# Patient Record
Sex: Female | Born: 1950 | Race: Black or African American | Hispanic: No | Marital: Married
Health system: Midwestern US, Community
[De-identification: ages and names within clinical notes are randomized; demographics above are authoritative.]

## PROBLEM LIST (undated history)

## (undated) DIAGNOSIS — K2950 Unspecified chronic gastritis without bleeding: Secondary | ICD-10-CM

## (undated) DIAGNOSIS — K862 Cyst of pancreas: Secondary | ICD-10-CM

## (undated) DIAGNOSIS — N183 Chronic kidney disease, stage 3 unspecified: Secondary | ICD-10-CM

## (undated) DIAGNOSIS — Z13820 Encounter for screening for osteoporosis: Secondary | ICD-10-CM

## (undated) DIAGNOSIS — Z1211 Encounter for screening for malignant neoplasm of colon: Secondary | ICD-10-CM

## (undated) DIAGNOSIS — K8010 Calculus of gallbladder with chronic cholecystitis without obstruction: Secondary | ICD-10-CM

## (undated) DIAGNOSIS — C50919 Malignant neoplasm of unspecified site of unspecified female breast: Secondary | ICD-10-CM

## (undated) DIAGNOSIS — I2510 Atherosclerotic heart disease of native coronary artery without angina pectoris: Secondary | ICD-10-CM

## (undated) DIAGNOSIS — E079 Disorder of thyroid, unspecified: Secondary | ICD-10-CM

## (undated) DIAGNOSIS — Z9221 Personal history of antineoplastic chemotherapy: Secondary | ICD-10-CM

## (undated) DIAGNOSIS — I38 Endocarditis, valve unspecified: Secondary | ICD-10-CM

## (undated) DIAGNOSIS — Z923 Personal history of irradiation: Secondary | ICD-10-CM

## (undated) DIAGNOSIS — E669 Obesity, unspecified: Secondary | ICD-10-CM

## (undated) DIAGNOSIS — I214 Non-ST elevation (NSTEMI) myocardial infarction: Secondary | ICD-10-CM

## (undated) DIAGNOSIS — I10 Essential (primary) hypertension: Secondary | ICD-10-CM

## (undated) DIAGNOSIS — E785 Hyperlipidemia, unspecified: Secondary | ICD-10-CM

## (undated) DIAGNOSIS — Z9861 Coronary angioplasty status: Secondary | ICD-10-CM

## (undated) HISTORY — DX: Obesity, unspecified: E66.9

## (undated) HISTORY — DX: Hyperlipidemia, unspecified: E78.5

## (undated) HISTORY — DX: Endocarditis, valve unspecified: I38

## (undated) HISTORY — PX: LYMPH NODE DISSECTION: SHX5087

## (undated) MED ORDER — IBUPROFEN 600 MG TAB
600 mg | ORAL_TABLET | Freq: Three times a day (TID) | ORAL | Status: DC
Start: ? — End: 2012-04-19

## (undated) MED ORDER — DOCUSATE SODIUM 100 MG CAP
100 mg | ORAL_CAPSULE | Freq: Two times a day (BID) | ORAL | Status: DC
Start: ? — End: 2012-04-19

## (undated) MED ORDER — HYDROCODONE-ACETAMINOPHEN 5 MG-325 MG TAB
5-325 mg | ORAL_TABLET | ORAL | Status: DC | PRN
Start: ? — End: 2012-04-19

---

## 2001-11-19 ENCOUNTER — Encounter: Payer: Self-pay | Admitting: Endocrinology

## 2001-11-19 ENCOUNTER — Encounter: Admission: RE | Admit: 2001-11-19 | Discharge: 2001-11-19 | Payer: Self-pay | Admitting: Endocrinology

## 2001-12-22 ENCOUNTER — Other Ambulatory Visit: Admission: RE | Admit: 2001-12-22 | Discharge: 2001-12-22 | Payer: Self-pay | Admitting: Obstetrics and Gynecology

## 2003-04-11 ENCOUNTER — Other Ambulatory Visit: Admission: RE | Admit: 2003-04-11 | Discharge: 2003-04-11 | Payer: Self-pay | Admitting: Obstetrics and Gynecology

## 2003-09-14 ENCOUNTER — Emergency Department (HOSPITAL_COMMUNITY): Admission: EM | Admit: 2003-09-14 | Discharge: 2003-09-14 | Payer: Self-pay

## 2003-09-17 ENCOUNTER — Inpatient Hospital Stay (HOSPITAL_COMMUNITY): Admission: EM | Admit: 2003-09-17 | Discharge: 2003-09-26 | Payer: Self-pay | Admitting: *Deleted

## 2003-10-06 ENCOUNTER — Ambulatory Visit (HOSPITAL_COMMUNITY): Admission: RE | Admit: 2003-10-06 | Discharge: 2003-10-06 | Payer: Self-pay | Admitting: Urology

## 2004-10-14 ENCOUNTER — Other Ambulatory Visit: Admission: RE | Admit: 2004-10-14 | Discharge: 2004-10-14 | Payer: Self-pay | Admitting: Obstetrics and Gynecology

## 2008-08-10 ENCOUNTER — Ambulatory Visit: Payer: Self-pay | Admitting: Oncology

## 2008-08-17 ENCOUNTER — Encounter: Admission: RE | Admit: 2008-08-17 | Discharge: 2008-08-17 | Payer: Self-pay | Admitting: Neurology

## 2008-08-30 ENCOUNTER — Encounter: Admission: RE | Admit: 2008-08-30 | Discharge: 2008-11-28 | Payer: Self-pay | Admitting: Oncology

## 2008-10-30 ENCOUNTER — Encounter: Admission: RE | Admit: 2008-10-30 | Discharge: 2008-10-30 | Payer: Self-pay | Admitting: Orthopedic Surgery

## 2008-11-08 ENCOUNTER — Ambulatory Visit: Payer: Self-pay | Admitting: Oncology

## 2008-11-10 LAB — CBC WITH DIFFERENTIAL/PLATELET
BASO%: 0.3 % (ref 0.0–2.0)
EOS%: 2.4 % (ref 0.0–7.0)
Eosinophils Absolute: 0.1 10*3/uL (ref 0.0–0.5)
HGB: 12.2 g/dL (ref 11.6–15.9)
MONO#: 0.6 10*3/uL (ref 0.1–0.9)
MONO%: 13.6 % (ref 0.0–14.0)
NEUT#: 3 10*3/uL (ref 1.5–6.5)
NEUT%: 69.7 % (ref 38.4–76.8)
lymph#: 0.6 10*3/uL — ABNORMAL LOW (ref 0.9–3.3)

## 2008-11-10 LAB — BASIC METABOLIC PANEL
BUN: 12 mg/dL (ref 6–23)
Potassium: 3.8 mEq/L (ref 3.5–5.3)
Sodium: 140 mEq/L (ref 135–145)

## 2008-11-13 ENCOUNTER — Encounter: Admission: RE | Admit: 2008-11-13 | Discharge: 2008-11-13 | Payer: Self-pay | Admitting: Oncology

## 2009-01-24 ENCOUNTER — Ambulatory Visit: Payer: Self-pay | Admitting: Oncology

## 2009-01-29 ENCOUNTER — Ambulatory Visit (HOSPITAL_COMMUNITY): Admission: RE | Admit: 2009-01-29 | Discharge: 2009-01-29 | Payer: Self-pay | Admitting: Oncology

## 2009-06-14 ENCOUNTER — Ambulatory Visit: Payer: Self-pay | Admitting: Oncology

## 2009-07-01 ENCOUNTER — Emergency Department (HOSPITAL_COMMUNITY): Admission: EM | Admit: 2009-07-01 | Discharge: 2009-07-01 | Payer: Self-pay | Admitting: Emergency Medicine

## 2009-10-18 LAB — D-DIMER, QUANTITATIVE: D-Dimer, Quant: 0.6 mg/L FEU (ref 0.00–0.65)

## 2009-10-18 LAB — METABOLIC PANEL, COMPREHENSIVE
A-G Ratio: 0.8 — ABNORMAL LOW (ref 1.1–2.2)
ALT (SGPT): 29 U/L (ref 12–78)
AST (SGOT): 12 U/L — ABNORMAL LOW (ref 15–37)
Albumin: 3.6 g/dL (ref 3.5–5.0)
Alk. phosphatase: 90 U/L (ref 50–136)
Anion gap: 8 mmol/L (ref 5–15)
BUN/Creatinine ratio: 13 (ref 12–20)
BUN: 14 MG/DL (ref 6–20)
Bilirubin, total: 0.5 MG/DL (ref 0.2–1.0)
CO2: 31 MMOL/L (ref 21–32)
Calcium: 9.5 MG/DL (ref 8.5–10.1)
Chloride: 101 MMOL/L (ref 97–108)
Creatinine: 1.1 MG/DL (ref 0.6–1.3)
GFR est AA: >60 mL/min/{1.73_m2} (ref >60)
GFR est non-AA: 54 mL/min/{1.73_m2} — ABNORMAL LOW (ref >60)
Globulin: 4.5 g/dL — ABNORMAL HIGH (ref 2.0–4.0)
Glucose: 145 MG/DL — ABNORMAL HIGH (ref 65–100)
Potassium: 3.3 MMOL/L — ABNORMAL LOW (ref 3.5–5.1)
Protein, total: 8.1 g/dL (ref 6.4–8.2)
Sodium: 140 MMOL/L (ref 136–145)

## 2009-10-18 LAB — PROTHROMBIN TIME + INR
INR: 1.1 (ref 0.9–1.1)
Prothrombin time: 11.2 SECS — ABNORMAL HIGH (ref 9.0–11.0)

## 2009-10-18 LAB — CBC W/O DIFF
HCT: 37.1 % (ref 35.0–47.0)
HGB: 12.2 g/dL (ref 11.5–16.0)
MCH: 27.1 PG (ref 26.0–34.0)
MCHC: 32.9 g/dL (ref 30.0–36.5)
MCV: 82.4 FL (ref 80.0–99.0)
PLATELET: 294 10*3/uL (ref 150–400)
RBC: 4.5 M/uL (ref 3.80–5.20)
RDW: 14.3 % (ref 11.5–14.5)
WBC: 7.2 10*3/uL (ref 3.6–11.0)

## 2009-10-18 LAB — EKG, 12 LEAD, INITIAL
Atrial Rate: 64 {beats}/min
Calculated P Axis: 36 degrees
Calculated R Axis: -9 degrees
Calculated T Axis: 19 degrees
Diagnosis: NORMAL
P-R Interval: 132 ms
Q-T Interval: 402 ms
QRS Duration: 82 ms
QTC Calculation (Bezet): 414 ms
Ventricular Rate: 64 {beats}/min

## 2009-10-18 LAB — LIPASE: Lipase: 145 U/L (ref 73–393)

## 2009-10-18 LAB — TROPONIN I: Troponin-I, Qt.: <0.04 ng/mL (ref <0.05)

## 2009-10-18 LAB — D DIMER: D-dimer: 0.6 mg/L FEU (ref 0.00–0.65)

## 2009-10-18 MED ORDER — NAPROXEN 500 MG TAB
500 mg | ORAL_TABLET | Freq: Two times a day (BID) | ORAL | Status: AC
Start: 2009-10-18 — End: 2009-10-28

## 2009-10-18 MED ORDER — HYDROCODONE-ACETAMINOPHEN 5 MG-325 MG TAB
5-325 mg | ORAL_TABLET | ORAL | Status: DC | PRN
Start: 2009-10-18 — End: 2012-03-26

## 2009-10-18 MED ORDER — KETOROLAC TROMETHAMINE 30 MG/ML INJECTION
30 mg/mL (1 mL) | INTRAMUSCULAR | Status: AC
Start: 2009-10-18 — End: 2009-10-18
  Administered 2009-10-18: 07:00:00 via INTRAVENOUS

## 2009-10-18 MED ADMIN — sodium chloride (NS) 0.9 % flush: @ 07:00:00 | NDC 82903065462

## 2009-10-18 MED FILL — KETOROLAC TROMETHAMINE 30 MG/ML INJECTION: 30 mg/mL (1 mL) | INTRAMUSCULAR | Qty: 1

## 2009-10-18 MED FILL — SALINE FLUSH INJECTION SYRINGE: INTRAMUSCULAR | Qty: 10

## 2009-10-18 NOTE — ED Notes (Signed)
Pt reports no more pain. Dr. Kizzie Bane aware. Pt resting in bed with spouse at bedside; respirations even and unlabored on room air. Will continue to monitor or pain.

## 2009-10-18 NOTE — ED Notes (Signed)
Pt reports to ED from home with complaints of RUQ pain that radiates to her back on the right side that began yesterday afternoon and has not gone away. Pt reports at times she can move into a certain position where the pain goes away. Pt lying in bed now and reports she is not feeling "pain", but that she can still feel it. Pt denies other symptoms.    Pt placed in position of comfort on bed with spouse at bedside and call bell in reach.

## 2009-10-18 NOTE — ED Notes (Signed)
Lab called to reports the blue top tube was dropped and needs to be redrawn and reordered.

## 2009-10-18 NOTE — ED Notes (Signed)
Dr. Kizzie Bane at bedside to discuss discharge with pt.

## 2009-10-18 NOTE — ED Notes (Signed)
Dr. Hughes at bedside to evaluate pt.

## 2009-10-18 NOTE — ED Provider Notes (Signed)
HPI Comments: 59 yo F presents to the ED C/O right side CP that started yesterday and has persisted since.  She describes her CP as a right sided pain under her right breast that is worse with deep inspirations, twisting, and lifting objects.  She can not recall the anything that she may have done that could have caused her pain.  She has not taken any medications for her Sx PTA at the ED.  Pt has no Hx of heart problems or CAD.  Pt denies SOB, palpitations, diaphoresis, nausea, vomiting, diarrhea, constipation, abdominal pain, fever, chills, cough, congestion, rhinorrhea, any other Sx or complaints.  Written by Rondall Allegra, ED Scribe, as dictated by Tonie Griffith, MD.         Past Medical History   Diagnosis Date   ??? Diabetes    ??? Hypertension    ??? Other ill-defined conditions      hypercholesteremia          Past Surgical History   Procedure Date   ??? Hx gyn      tubal ligation            No family history on file.     History   Social History   ??? Marital Status: N/A     Spouse Name: N/A     Number of Children: N/A   ??? Years of Education: N/A   Occupational History   ??? Not on file.   Social History Main Topics   ??? Smoking status: Never Smoker    ??? Smokeless tobacco: Not on file   ??? Alcohol Use: No   ??? Drug Use: No   ??? Sexually Active:    Other Topics Concern   ??? Not on file   Social History Narrative   ??? No narrative on file                    ALLERGIES: Ampicillin      Review of Systems   Constitutional: Negative.  Negative for fever, chills and appetite change.   HENT: Negative.  Negative for congestion, sore throat, rhinorrhea and neck stiffness.    Eyes: Negative.  Negative for visual disturbance.   Respiratory: Negative.  Negative for cough, shortness of breath and wheezing.    Cardiovascular: Positive for chest pain. Negative for palpitations and leg swelling.   Gastrointestinal: Negative.  Negative for nausea, vomiting, abdominal pain, diarrhea and constipation.    Genitourinary: Negative.  Negative for dysuria, urgency and frequency.   Musculoskeletal: Negative.  Negative for myalgias, back pain and joint swelling.   Skin: Negative.  Negative for rash.   Neurological: Negative.  Negative for dizziness, syncope, weakness and headaches.   Hematological: Negative.  Negative for adenopathy.   Psychiatric/Behavioral: Negative.  Negative for behavioral problems and dysphoric mood.   All other systems reviewed and are negative.        Filed Vitals:    10/18/09 0255   BP: 144/88   Pulse: 84   Temp: 98.8 ??F (37.1 ??C)   Resp: 16   Height: 5\' 7"  (1.702 m)   Weight: 169 lb 5 oz (76.8 kg)   SpO2: 99%              Physical Exam   Nursing note and vitals reviewed.  Constitutional: She is oriented to person, place, and time. She appears well-developed and well-nourished.   HENT:   Head: Normocephalic and atraumatic.   Mouth/Throat: Oropharynx is clear and moist.  Eyes: Conjunctivae and EOM are normal. Pupils are equal, round, and reactive to light.   Neck: Normal range of motion. Neck supple. No JVD present. No tracheal deviation present.   Cardiovascular: Normal rate, regular rhythm, normal heart sounds and intact distal pulses.    No murmur heard.  Pulmonary/Chest: Effort normal and breath sounds normal. No stridor. No respiratory distress. She has no wheezes.   Abdominal: Soft. Bowel sounds are normal. She exhibits no distension. No tenderness. She has no rebound and no guarding.   Musculoskeletal: Normal range of motion. She exhibits no edema.   Neurological: She is alert and oriented to person, place, and time. No cranial nerve deficit.   Skin: Skin is warm and dry.   Psychiatric: She has a normal mood and affect. Her behavior is normal.        MDM    Procedures    4:35 AM   Pt has been re-examined.  Pt is feeling better.  Findings, lab results, radiological results, and medications were reviewed with the Pt.  Pt advised to follow up with her PCP for further evaluation and Tx (possible stress test) .  Pt's questions were answered and Pt agrees with Tx, D/C, and follow up plans.  Pt advised to return to the ED for worsening Sx.  Pt is ready to be D/C home.      ED EKG interpretation:  Rhythm: normal sinus rhythm; and regular . Rate (approx.): 64; Axis: left axis deviation; P wave: normal; QRS interval: normal ; ST/T wave: normal;Q waves inferiorly. This EKG was interpreted by Tonie Griffith, MD,ED Provider.  Written by Rondall Allegra, ED Scribe, as dictated by Tonie Griffith, MD.

## 2009-10-18 NOTE — ED Notes (Signed)
Dr. Hughes at bedside to speak with pt.

## 2009-10-18 NOTE — ED Notes (Signed)
Pt placed on monitor x2.

## 2009-10-18 NOTE — ED Notes (Signed)
I have reviewed discharge instructions with the patient.  The patient verbalized understanding. Pt reports no additional questions at this time.

## 2010-05-27 ENCOUNTER — Encounter: Payer: Self-pay | Admitting: Oncology

## 2010-09-20 NOTE — Op Note (Signed)
Proctor, Brooke                          ACCOUNT NO.:  [id number]   MEDICAL RECORD NO.:  [id number]                   PATIENT TYPE:  INP   LOCATION:  2102                                 FACILITY:  [facility]   PHYSICIAN:  [place] Service, M.D.             DATE OF BIRTH:  [birth year]   DATE OF PROCEDURE:  09/19/2003  DATE OF DISCHARGE:                                 OPERATIVE REPORT   PREOPERATIVE DIAGNOSES:  1. Uncontrolled hypertension.  2. Hypertensive nephropathy, creatinine 7.2 to 3.4 over the last 24-36     hours.  3. Proteus bacteremia and urinary tract infection with sepsis.  4. Patient at high risk for progressive ARDS.  5. Nephrostomy placement not feasible due to thrombocytopenia.  Plans made     for cystoscopy under IV sedation and double-J stent placement.   POSTOPERATIVE DIAGNOSIS:  Same (as per physician request).   ANESTHESIA:  IV sedation.   DRAINS:  A #6 French 24 cm double-J stent.   OPERATIVE PROCEDURE:  The patient was prepped and draped in the dorsal  lithotomy position after institution of an adequate level of IV sedation.  Left retrograde showed a normal course and caliber of the ureter, pelvis and  calices, with prompt drainage at 3-5 minutes.  Right ureteral orifice showed  bullous edema consistent with stone in that location as noted on CT scan  from Sep 16, 2003, and Sep 14, 2003.  No attempt was made to manipulate the  stone.  The guidewire was gently negotiated at the right ureteral orifice  and end-hole catheter was advanced over the guidewire.  Retrograde showed  proximal hydronephrosis.  Guidewire was negotiated into the lower pole  calices.  End-hole catheter was withdrawn and there was prompt reflux of  cloudy urine from the right ureteral orifice.  A #6 French 24 cm double-J  stent was selected and advanced over the guidewire with excellent pigtail  formation in the right renal pelvis and int the bladder.  Bladder was  drained and  cystoscope was removed.  A 20 French Foley was inserted and left  to straight drain.  The patient was returned to recovery in stable  condition.                                               Boston Service, M.D.    RH/MEDQ  D:  09/19/2003  T:  09/20/2003  Job:  [id number]   cc:   Garnetta Buddy, M.D.  [address]  [place]  [place] [address]  Fax: [phone]

## 2010-09-20 NOTE — Consult Note (Signed)
Brooke Proctor, Brooke Proctor                          ACCOUNT NO.:  [id number]   MEDICAL RECORD NO.:  [id number]                   PATIENT TYPE:  INP   LOCATION:  2102                                 FACILITY:  [facility]   PHYSICIAN:  Leighton Roach. Truett Perna, M.D.              DATE OF BIRTH:  08-31-1950   DATE OF CONSULTATION:  09/18/2003  DATE OF DISCHARGE:                                   CONSULTATION   REASON FOR CONSULTATION:  Low platelet count.   HISTORY OF PRESENT ILLNESS:  Brooke Proctor is a pleasant 60 year old African-  American female ask to see in consultation for evaluation of  thrombocytopenia. The patient has known history of renal insufficiency. Seen  on May 12 by her urologist with complaints of right flank pain, for which  she was sent to the emergency department at [facility] on May 12,  for further evaluation. A CT of the abdomen without contrast was remarkable  for a 3 mm in diameter right renal calculus. No labs were drawn at that  time. The patient was discharged with instructions of increasing fluid  intake, given ibuprofen 600 mg q.i.d., Lortab, and nausea medication. Over  the next three days, the patient felt increasingly worse, with uncontrolled  level of pain, diarrhea x3 two days prior to that, poor oral intake,  vomiting on the day of admission, and chills. She denied any fever. She also  denied any night sweats. No rash, no history of hepatitis or exposure to  HIV. On admission, she had gross hematuria. No shortness of breath.   LABORATORY DATA:  Labs on admission demonstrated a UA with large blood, this  material was sent for culture. Blood cultures was positive for Gram negative  rod bacteremia. It was noted that her bilirubin was elevated, and a CBC  showed her platelets to be 59,000. Her peripheral smear was positive for  burr cells, and her LDH was slightly elevated to 285. PT was borderline  normal and DAT was negative.   The need for nephrostomy versus  double stent, a new CBC was performed  revealing a drop in her platelet count, now 13,000. Because of the numbers,  the procedure is on hold and we were called to see her, evaluate her  situation, and rule out hemolysis as well.   Of note, reviewing the previous medical records from Dr. George Hugh office  her platelet count used to be normal, at least until July 2004 with a count  of 245,000.   PAST MEDICAL HISTORY:  1. Thrombocytopenia.  2. History of renal insufficiency.  3. Renal calculi with intractable pain on admission on Sep 17, 2003.  4. Hypertension.  5. History of pituitary adenoma diagnosed around 15-17 years ago, undergoing     MRI annually as ordered by Dr. Everardo All.  6. History of galactosuria.  7. Menopause.  8. Dyslipidemia.  9. Chronic leukopenia.  10.  Insomnia.   SURGERIES/PROCEDURES:  1. Status post hysterectomy for fibroids about 25 years ago.  2. Status post removal of a right lipoma.   ALLERGIES:  No known drug allergies.   CURRENT MEDICATIONS:  1. Zofran p.r.n.  2. NaSO4 as directed.  3. Lortab 2 mg q.4h. p.r.n.  4. Cipro IV 400 mg q.24h.  5. Fortaz 1 g q.24h.  6. Until recently she was taking Vivelle patch.   REVIEW OF SYSTEMS:  See HPI for significant positives. Until recently, as  mentioned above, she was taking birth control pills. She denies any  shortness of breath, but cannot take a deep breath due to the pain at this  time. Her pain starts in the right flank and extends to the right lower  quadrant. She also has some pain in the upper epigastrium. Of note, she also  noticed hematuria back in 2003, although no further notes are available at  this time.   FAMILY HISTORY:  Mother alive with a history of hypertension and CVA. Father  deceased with ESRD, hypertension. No history of cancer or diabetes in the  family. No history of blood dyscrasias.   SOCIAL HISTORY:  The patient is married. She has three grown children. The  patient was  originally from [place]. She works at [employer]. She  has rare consumption of alcohol. No history of tobacco intake. Her main  caretaker is her husband Dorene Sorrow, telephone number [phone]. The patient lives  in [place].   HEALTH MAINTENANCE:  Her last mammogram was this year in 2005, with normal  results. Her last colonoscopy was in 2003, normal. Her last stress test in  2004 was normal. She never had a transfusion. Her flu shot is up-to-date.  Her last bone density scan was in 2004, normal.   PHYSICAL EXAMINATION:  GENERAL:  This is a well-developed, moderately obese  60 year old black female in significant discomfort, pain. Alert and oriented  x3.  VITAL SIGNS:  Blood pressure 100/60, pulse 70, respirations 20, temperature  97.2, weight 181 pounds. Height 5 feet 7. Pulse oximetry 93 on 2 L.  HEENT:  Head normocephalic, atraumatic. PERRLA.  Funduscopic exam not  performed. Oral mucosa without lesions, bleeding, very mild white coating,  question oral thrush.  NECK:  Supple. No JVD. No cervical or supraclavicular masses.  CHEST:  Symmetrical to inspiration.  LUNGS:  Demonstrated trace bibasilar rales. No wheezing, no axillary masses.  BREAST:  Not examined.  CARDIOVASCULAR:  Regular rate and rhythm. No murmurs, rubs, or gallops.  ABDOMEN:  Moderately obese. Tender in the mid left upper quadrant, right  lower quadrant, and right flank. No palpable spleen or liver.  GU/RECTAL:  Deferred.  EXTREMITIES:  No clubbing or cyanosis. No edema.  SKIN:  Without petechiae or purpura.  NEUROLOGIC:  Nonfocal.   LABORATORY DATA:  Hemoglobin 10.8, hematocrit 31.8, white count 16.  Platelets 13,000, MCV 84.4, RDW 13.6. PT 14.5, PTT 33, INR 1.2, DAT  negative. Sodium 136, potassium 3.3. BUN 93, creatinine 6.5 (of note, on  admission May 15, this was 7.5). Total bilirubin 2.2, alkaline phosphatase 390, AST 23, ALT 13. Total protein 6.2, albumin 3.5, calcium 8.8. LDH  elevated at 85. Of  note, there was a slight elevation of the LFTs back in  July 2004. Phosphate 2.7.   ASSESSMENT:  A 60 year old African-American female admitted with right flank  pain secondary to right stone  - hydronephrosis, now with acute renal  failure and thrombocytopenia. She reports chills  for a few days. On exam she  is afebrile. She has good oxygen saturation, no conjunctival hemorrhage.  Some thrush over the tongue. Skin without petechiae. Mouth without bleeding.  No hepatosplenomegaly. She is alert and oriented. Her lungs showed small  rhonchi, rales at the bases. The smear shows RBCs and burr cells. No  polychromastia. Rare spherocytes, rare fragments, many _________ cells and  Doehle bodies. Very low platelet count, some giant, no clumps, ________  10,000, bleeding, or prior to percutaneous procedure.   IMPRESSION:  1. Right flank pain.  2. Right ureteral stone - hydronephrosis.  3. Severe thrombocytopenia.  4. Mild anemia.  5. Renal failure.  6. Positive blood cultures for Gram-negative rods.  7. Mild elevation of LDH.  8. Mild hyperbilirubinemia.  9. Neutrophilia with numerous bands.   The patient presented with an acute illness characterized by right flank  pain, renal failure, and severe thrombocytopenia. This appears to be a right  ureteral stone causing the pain, right hydronephrosis.  Dr. Truett Perna has  seen and examined the patient suspecting that she has a secondary infection  - urosepsis. This likely explains this thrombocytopenia by causing bone  marrow suppression and DIC. The renal failure may be explained by NSAID use  or sepsis - hypotension - dehydration. Mildly elevated LDH and bilirubin are  consistent with sepsis. Dr. Truett Perna counts a low suspicion for a primary  hematologic process such as DTP.   RECOMMENDATIONS:  1. Broad-spectrum IV antibiotics.  2. Transfuse platelets for less than 10,000 count, bleeding, or prior to a     percutaneous procedure.  3.  Management of the renal stones by urology.  4. Repeat PT/PTT and a full DIC screen.  5. IV fluids, management of renal failure by nephrology.  6. Followup as an outpatient creatinine and CBC at Dr. George Hugh office.   Thank you very much for allowing [place] to participate in the care of Brooke Proctor.  If you have any questions, please contact Dr. Truett Perna, pager number [phone].     Marlowe Kays, P.A.                        Leighton Roach. Truett Perna, M.D.    SW/MEDQ  D:  09/18/2003  T:  09/18/2003  Job:  [id number]   cc:   Titus Dubin. Alwyn Ren, M.D. [facility]   Garnetta Buddy, M.D.  [address]  [place]  [place] [address]  Fax: [phone]   Gregary Signs A. Everardo All, M.D. [facility]

## 2010-09-20 NOTE — Discharge Summary (Signed)
NAMECRYTAL, PENSINGER                          ACCOUNT NO.:  [id number]   MEDICAL RECORD NO.:  [id number]                   PATIENT TYPE:  INP   LOCATION:  5702                                 FACILITY:  [facility]   PHYSICIAN:  Rene Paci, M.D. [facility]          DATE OF BIRTH:  Jul 01, 1950   DATE OF ADMISSION:  09/17/2003  DATE OF DISCHARGE:  09/26/2003                                 DISCHARGE SUMMARY   DISCHARGE DIAGNOSES:  1. Proteus mirabilis bacteremia.  2. Urosepsis.  3. Obstructive uropathy.  4. Nephrolithiasis.  5. Right hydronephrosis.  6. Acute renal insufficiency.  7. Acute lung injury.  8. Thrombocytopenia.  9. Disseminated intravascular coagulation.  10.      Normocytic anemia.   BRIEF ADMISSION HISTORY:  Ms. Balzarini is a 60 year old African American female  who was seen in the emergency room on Sep 17, 2003, with flank pain. She was  found to have a 3-cm stone. She was scheduled to see a urologist on Sep 18, 2003, in [place]. She was discharged from the emergency room with  oxycodone and ibuprofen. She seemed to have temporary pain relief for  several hours, but then developed nausea and vomiting. She presented with  intractable flank pain.   PAST MEDICAL HISTORY:  1. Status post total abdominal hysterectomy.  2. History of removal of a lipoma from her back.  3. Hypertension.  4. Postmenopausal.  5. History of a pituitary adenoma, followed by Dr. Delton See with MRIs     annually.  6. History of galacturia.   HOSPITAL COURSE:  Problem 1. Renal/nephrology. The patient presented with  acute renal insufficiency. She presented with a creatinine of 7.6.  Urinalysis was consistent with a urinary tract infection. The patient had a  CT of the abdomen and pelvis consistent with a right hydronephrosis and  hydroureter. There was also a 3-mm calculus that had advanced from the  distal right ureter to the ureteral orifice compared with the study from Sep 14, 2003. The  patient was seen in consultation by both urology and  nephrology. Nephrology suspected that her acute renal failure was secondary  to right hydronephrosis that may have also been exacerbated by volume  depletion, dehydration and NSAID use. The patient was being considered for a  percutaneous nephrostomy versus a Double J stent and the intervention was  placed on hold because of the development of thrombocytopenia. Ultimately,  she did have a cystoscopy with retrogrades and a right Double J stent placed  on Sep 19, 2003. He did not make any attempt to manipulate the stone. He  stated that she would need to follow up in 2-3 weeks for removal of the  stent and ureteroscopy. The patient's renal function normalized, however,  she continued to have some right flank pain. We suspect she will continue to  have right flank pain until the stone has been removed or passes. She has  been therefore discharged home on narcotic pain medications.   Problem 2. Infectious disease. The patient developed Proteus mirabilis  bacteremia. This was suspected to be secondary to an obstructive urosepsis.  The patient has received 10 days of antibiotics at this time and is  currently on Keflex. The patient also appears to have some mild cellulitis  of her right foot which should also be covered by the Keflex.   Problem 3. Hematology. The patient developed thrombocytopenia. The patient  was seen in consultation by Dr. Truett Perna.  He suspected this was secondary  to DIC and sepsis. The patient's platelets did normalize with the treatment  of the underlying sepsis. She also had normocytic anemia. Her hemoglobin  dropped to 7.8. The patient is not currently interested in a transfusion.  The patient will be discharged home on iron and will have her follow up with  Dr. Truett Perna in a couple of weeks.   Problem 4. Pulmonary. The patient did develop some acute lung injury, but  did not develop ARDS. The patient was seen  briefly in consultation by  pulmonary critical care medicine, however, once the stent was placed her  critical state improved.   LABORATORY AT DISCHARGE:  White count was 9.9, hemoglobin 10.8, platelet  count was 280. BUN was 10, creatinine was 0.8. LFTs were normal except for  an alkaline phosphatase of 122 and a total bilirubin of 2.1.   MEDICATIONS AT DISCHARGE:  1. Keflex 500 mg q.i.d. until further instructed by Dr. Wanda Plump.  2. Diovan 80 mg daily.  3. MSIR 15 mg 1-2 tabs daily.  4. Niferex 150 mg b.i.d.   FOLLOWUP:  With Dr. Everardo All in 2-3 weeks. Follow up with Dr. Wanda Plump  Tuesday, Oct 03, 2003, and with Dr. Truett Perna in about 2 weeks.      Cornell Barman, P.A. [facility]                  Rene Paci, M.D. [facility]    LC/MEDQ  D:  09/26/2003  T:  09/27/2003  Job:  [id number]   cc:   [place] Service, M.D.  [address]  [place]  [place] [address]  Fax: [phone]   Leighton Roach. Truett Perna, M.D.  [address] [facility]  [place]  [place] [address]  Fax: [phone]   Gregary Signs A. Everardo All, M.D. [facility]

## 2010-09-20 NOTE — Consult Note (Signed)
Brooke Proctor, NOLTE                          ACCOUNT NO.:  [id number]   MEDICAL RECORD NO.:  [id number]                   PATIENT TYPE:  INP   LOCATION:  1829                                 FACILITY:  [facility]   PHYSICIAN:  Garnetta Buddy, M.D.                DATE OF BIRTH:  [birth year]   DATE OF CONSULTATION:  DATE OF DISCHARGE:                                   CONSULTATION   REASON FOR CONSULTATION:  Acute renal failure.   HISTORY OF PRESENT ILLNESS:  A 60 year old [place] American female with no  known prior renal disease in the past, was seen on Thursday, Sep 14, 2003,  in the emergency room with acute renal colic, right-sided hydronephrosis.  No labs apparently were drawn at that time, but the patient was discharged  with instructions of increasing fluid intake, and given ibuprofen and Lortab  in anticipation of passing a right-sided renal calculus.  It only measured 3  mm in diameter.  No intravenous contrast was given, but over the next three  days, Ms. Yankey continued with vomiting, poor p.o. intake, NSAID use and  chills.  She denies any fever or sweats.   PAST MEDICAL HISTORY:  1. History of pituitary adenoma, followed by Dr. Delton See.  MRI's annually.  2. History of galacturia.  3. Mild hypertension.  4. History of birth control, followed by OB/GYN.   MEDICATIONS:  1. Ibuprofen 600 mg q.i.d.  2. Hydrocodone APAP/ hydrochlorothiazide 25 mg daily.   ALLERGIES:  No known drug allergies.   SOCIAL HISTORY:  She is married.  She is from [place].  She works at  [employer].  She has three children.   FAMILY HISTORY:  Her father died at age 21 of end-stage renal disease.  He  was on dialysis for a relatively short time.   REVIEW OF SYSTEMS:  GENERAL:  Positive for chills, negative for fever.  HEENT:  Eyes:  Positive for eye glasses, negative for visual loss.  Ears,  nose and throat, no hearing loss, no epistaxis.  CARDIOVASCULAR:  No chest pain, no shortness of  breath, no palpitations, no  loss of consciousness.  RESPIRATORY SYSTEM:  No cough, no wheeze, no hemoptysis.  ABDOMINAL SYSTEM:  Right sided flank pain to groin, nausea, vomiting per  day, no p.o. intake.  UROGENITAL:  No dysuria, urine is dark colored.  NEUROLOGIC:  No history of stroke or seizures.  No numbness or tingling,  pins and needles in her upper lower extremities.  MUSCULOSKELETAL:  No joint pain or muscle pain.  HEMATOLOGIC/ ONCOLOGIC:  No history of bleeding disorder, no history of DVT  or pulmonary embolus.   PHYSICAL EXAMINATION:  GENERAL:  An alert lady, in no obvious distress.  VITAL SIGNS:  Blood pressure 110/80, pulse 110.  Temperature 97.8.  HEENT:  Head and eyes, no icterus, no pallor.  Pupils round, equal, and  reactive.  Ears, nose, throat appearance is normal.  Oropharynx is clear.  NECK:  Supple, no thyromegaly, no adenopathy, no JVD.  Carotids were normal.  CARDIOVASCULAR:  Regular rate and rhythm, no heaves, no thrills, no murmurs,  rubs or gallops.  RESPIRATORY:  Lung fields were clear to auscultation and percussion.  ABDOMEN:  Revealed tenderness in the right flank, soft, right abdominal  tenderness and no palpable urinary bladder.  EXTREMITIES:  Revealed pulses that were symmetric, right and left.  No  edema.  SKIN:  Revealed no evidence of skin rashes.   LABORATORY DATA:  Sodium 135, potassium 3.9, chloride 105, BUN 72,  creatinine 7.6.  Hemoglobin 12.4, bilirubin 2.0, AST and ALT within normal  range.   Urinalysis large hemoglobin, negative for red blood cells.  Wbc's 11-20 per  high powered film.  CT scan of the abdomen repeated reveals right-sided  hydronephrosis with stranding.   ASSESSMENT/PLAN:  1. Acute renal failure with right-sided hydronephrosis, volume depletion,     NSAID use, possible acute tubular necrosis.  Would agree with rehydration     with IV fluids and recheck creatinine in the morning.  2. Right-sided hydronephrosis.  Will  probably need percutaneous nephrostomy     if renal stone not passed in a.m.  We will place the patient on broad-     spectrum antibiotics including Cipro and IV Fortaz.  Dr. Wanda Plump has     been consulted and has evaluated the patient for possible cystoscopy.  3. Appears to have hemolysis with increase in bilirubin and hemoglobinuria.     We will check LDH, peripheral smears, __________consistent with uremia.  4. Avoid nephrotoxins, nonsteroidals antiinflammatories and Demerol due to     increase in metabolites with renal failure.                                               Garnetta Buddy, M.D.    MWW/MEDQ  D:  09/17/2003  T:  09/17/2003  Job:  [id number]   cc:   Titus Dubin. Alwyn Ren, M.D. [facility]

## 2010-09-20 NOTE — H&P (Signed)
Brooke Proctor, Brooke Proctor                          ACCOUNT NO.:  [id number]   MEDICAL RECORD NO.:  [id number]                   PATIENT TYPE:  INP   LOCATION:  1829                                 FACILITY:  [facility]   PHYSICIAN:  Titus Dubin. Alwyn Ren, M.D. [facility]         DATE OF BIRTH:  [birth year]   DATE OF ADMISSION:  09/17/2003  DATE OF DISCHARGE:                                HISTORY & PHYSICAL   REASON FOR ADMISSION:  Brooke Proctor is a 60 year old African American female  admitted with renal insufficiency in the setting of hypertension and recent  renal calculi.   HISTORY OF PRESENT ILLNESS:  She was seen on Sep 17, 2003 with flank pain  and apparently found to have a 3 cm stone.  She was scheduled to see  urologist, Sep 18, 2003 in [place].  Initially in the emergency room, she  was found to be hypertensive.  There was some improvement in her blood  pressure at the time of discharge.  Apparently, her blood pressure was  190/130.   She was discharged on Oxycodone/APAP 5/500 mg, promethazine, and ibuprofen  600 mg q.i.d.  She would have pain relief only for a couple of hours but was  also having nausea and vomiting.  She had nausea and vomiting two to three  times on Sep 16, 2003 but none today.   Because of the intractable flank pain which she describes as a 10 on a scale  of 10, she returned to the emergency room.   PAST MEDICAL HISTORY:  1. Total abdominal hysterectomy for unknown reasons.  2. She has had a lipoma removed from the back.  3. She is gravida 3, para 3.  4. She has a longstanding history of hypertension.  Apparently, she was     treated with hydrochlorothiazide of unknown dose by Dr. Gregary Signs A. Everardo All,     her primary care physician.  She discontinued this one month ago hoping     that life style changes including diet and exercise would control her     blood pressure.  Her dietary interventions consist of not cooking with     sodium but continue to eat foods that have  sodium in them.  She was also     restricting fat and fried foods and increasing vegetable intake.  5. She also continued her Estrogen patch which she was prescribed by Dr.     Duke Salvia. Marcelle Overlie one month ago.  Again, she wanted to treat her     postmenopausal symptoms with more life style interventions other than     using Biodel patch.  She has not been using the supplements and does not     take vitamins.   ALLERGIES:  No known drug allergies.   SOCIAL HISTORY:  She rarely drinks.  Does not smoke.  She works as a Dispensing optician.   FAMILY HISTORY:  Includes hypertension in both  parents.  Her father had  renal insufficiency and was on dialysis.  Mother had a stroke.  Maternal  uncle had renal calculi.  There is no family history of cancer or diabetes.   REVIEW OF SYMPTOMS:  She has had mild headaches in the context of this  present illness.  She has some chest wall pain which occurred when she tried  to get up from the bed without help.  She has had some pain but no fever.  Purulent secretions.  She denies any abdominal pain.  Stools have been  loose.  She has had some arthralgias.   PHYSICAL EXAMINATION:  VITAL SIGNS:  At this time, her blood pressure is  104/60 but she received parental pain medications.  She actually had to have  Narcan due to oversedation.  Pulses 128, respiratory rate 34.  Temperature  was 97.8.  GENERAL:  She is lethargic but oriented to time, place, and person.  SKIN:  Hot and dry.  NECK:  Arterial narrowing is present.  She has no carotid bruits.  HEENT:  There is poor visualization of disk.  HEART:  A gallop cadence is noted.  LUNGS:  She has minimal rales and no increased work of breathing.  ABDOMEN:  Decreased bowel sounds but nontender and no organomegaly.  There  is dullness of the right upper quadrant.  EXTREMITIES:  Pedal pulses are intact.  She has no edema.  NEUROLOGICAL:  There are no localizing neuropsychiatric findings but she is  lethargic as  noted.   LABORATORY DATA:  Her hematocrit is 37, glucose 107.  Urinalysis reveals  large hemoglobin, moderate bilirubin, and small leukocytes.  BUN is 72 and  creatinine is 7.6.  BUN and creatinine was not performed on August 16, 2003.  Urinalysis on Sep 14, 2003 showed trace hemoglobin, small leukocyte  esterase, few bacteria were noted.   PLAN:  She will be admitted to telemetry and cultures will be collected.  Pathology consultation will be pursued because of her renal function which  apparently is acute.  The 3 cm calculi does not seem to be the etiology of  this and it is possible that she has interstitial nephritis or hypertensive-  related renal insufficiency.  At this time, her hypertension has improved  dramatically but she has also received parenteral pain medications which  have sedated her.                                                Titus Dubin. Alwyn Ren, M.D. [facility]    WFH/MEDQ  D:  09/17/2003  T:  09/17/2003  Job:  [id number]   cc:   Gregary Signs A. Everardo All, M.D. [facility]   Richard M. Marcelle Overlie, M.D.  [address]  [place] [address]  Fax: [phone]

## 2010-09-20 NOTE — Op Note (Signed)
[name], Proctor                          ACCOUNT NO.:  [id number]   MEDICAL RECORD NO.:  [id number]                   PATIENT TYPE:  AMB   LOCATION:  DAY                                  FACILITY[facility]  [facility]   PHYSICIAN:  [place] Service, M.D.             DATE OF BIRTH:  21-Jun-1950   DATE OF PROCEDURE:  10/06/2003  DATE OF DISCHARGE:                                 OPERATIVE REPORT   INTERNAL MEDICINE:  Sean A. Everardo All, M.D.   PREOPERATIVE DIAGNOSES:  Recent history of 3 mm right ureterovesical  junction  stone, uncontrolled hypertension, proteus pyelonephritis, sepsis,  thrombocytopenia, ? adult respiratory distress syndrome, hemoglobin of 7.8.   POSTOPERATIVE DIAGNOSES:  Same.   PROCEDURE:  Cystoscopy right and left retrograde, right ureteroscopy, double  J stent removal.   ANESTHESIA:  General.   DRAINS:  None.   COMPLICATIONS:  None.   DESCRIPTION OF PROCEDURE:  The patient was prepped and draped in the dorsal  lithotomy position after institution of an adequate level of general  anesthesia.  A well lubricated 21 French panendoscope was gently inserted at  the urethral meatus, normal urethra and sphincter, normal trigone on the  left, double J stent present on the right.  Left retrograde showed normal  course and caliber of the ureter, pelvis and calices with prompt drainage of  3-5 minutes.  A double J stent was removed on the right, guidewire was  negotiated into the upper pole calices, a 6 French ureteroscope was inserted  along side the guidewire, no intraureteral pathology was identified  specifically no evidence of stone.  The ureter was carefully inspected from  the renal pelvis to the UVJ on two separate occasions.  A guidewire was then  removed, retrograde films were obtained, no filling defects or obstruction  identified.  Retrograde films shot twice in both cases contrast drained out  in 3-5 minutes. The bladder was again carefully inspected, showed no  evidence of urothelial abnormalities, bladder was drained, cystoscope was  removed.  The patient was given a B&O suppository and returned to recovery  in satisfactory condition.                                               Boston Service, M.D.    RH/MEDQ  D:  10/06/2003  T:  10/06/2003  Job:  [id number]   cc:   Gregary Signs A. Everardo All, M.D. [facility]

## 2012-03-26 LAB — EKG, 12 LEAD, INITIAL
Atrial Rate: 95 {beats}/min
Calculated P Axis: 64 degrees
Calculated R Axis: -17 degrees
Calculated T Axis: 34 degrees
Diagnosis: NORMAL
P-R Interval: 124 ms
Q-T Interval: 356 ms
QRS Duration: 82 ms
QTC Calculation (Bezet): 447 ms
Ventricular Rate: 95 {beats}/min

## 2012-03-26 NOTE — Other (Signed)
[facility]  Preoperative Instructions        Surgery Date  03-29-12          Time of Arrival 12:45pm    1. On the day of your surgery, please report to the Surgical Services Registration Desk and sign in at your designated time. The Surgery Center is located to the right of the Emergency Room.    2. You must have someone with you to drive you home.You should not drive a car for 24 hours following surgery.    3. Do not have anything to eat or drink ( including water, gum, mints, coffee, juice) after midnight 03-28-12 . This may not apply to medications prescribed by your physician.  Please note special instructions, if applicable.  If you are currently taking Plavix, Coumadin, or other blood-thinning agents, contact your surgeon for instructions.    4. We recommend you do not drink any alcoholic beverages for 24 hours before and after your surgery.    5. Have a list of all current medications, including vitamins, herbal supplements and any other over the counter medications.  Stop Aspirin and non-steroidal anti-inflammatory drugs (I.e. Advil, Aleve) as directed by your surgeon's office. Stop all vitamins and herbal supplements seven days prior to your surgery.    6. Wear comfortable clothes.  Wear glasses instead of contacts.  Do not bring any money or jewelry. Please bring picture ID, insurance card, and any prearranged co-payment or hospital payment.  Do not wear make-up, particularly mascara, the morning of your surgery.  Do not wear nail polish, particularly if you are having foot /hand surgery.  Wear your hair loose or down, no ponytails, buns, bobby pins or clips.  All body piercings must be removed.  Please shower the night before or morning of surgery, but do not apply any lotions, powders or deodorants afterwards. Please do not shave for 48 hours prior to surgery, shaving of the face is acceptable.    7. You should understand that if you do not follow these instructions your surgery  may be cancelled.  If your physical condition changes (I.e. fever, cold or flu) please contact your surgeon as soon as possible.    8. It is important that you be on time.  If a situation occurs where you may be late, please call [phone] (OR Holding Area).    9. If you have any questions and or problems, please call [phone] (Pre-admission Testing).    10. Your surgery time may be subject to change.  You will receive a phone call the evening prior if your time changes.    11.  If having outpatient surgery, you must have someone to drive you here, stay with you during the duration of your stay, and to drive you home at time of discharge.      Special Instructions:    MEDICATIONS TO TAKE THE MORNING OF SURGERY WITH A SIP OF WATER: norvasc       I understand a pre-operative phone call will be made to verify my surgery time.  In the event that I am not available, I give permission for a message to be left on my answering service and/or with another person?  Yes          ___________________      ___________________  _________  (Signature of Patient)          (Witness)                              (  Date and Time)

## 2012-03-26 NOTE — Patient Instructions (Signed)
MyChart Activation    Thank you for requesting access to MyChart. Please follow the instructions below to securely access and download your online medical record. MyChart allows you to send messages to your doctor, view your test results, renew your prescriptions, schedule appointments, and more.    How Do I Sign Up?    1. In your internet browser, go to [email or web address]  2. Click on the First Time User? Click Here link in the Sign In box. You will be redirect to the New Member Sign Up page.  3. Enter your MyChart Access Code exactly as it appears below. You will not need to use this code after you???ve completed the sign-up process. If you do not sign up before the expiration date, you must request a new code.    MyChart Access Code: [facility]  Expires: 06/24/2012 10:48 AM (This is the date your MyChart access code will expire)    4. Enter the last four digits of your Social Security Number (xxxx) and Date of Birth (mm/dd/yyyy) as indicated and click Submit. You will be taken to the next sign-up page.  5. Create a MyChart ID. This will be your MyChart login ID and cannot be changed, so think of one that is secure and easy to remember.  6. Create a MyChart password. You can change your password at any time.  7. Enter your Password Reset Question and Answer. This can be used at a later time if you forget your password.   8. Enter your e-mail address. You will receive e-mail notification when new information is available in MyChart.  9. Click Sign Up. You can now view and download portions of your medical record.  10. Click the Download Summary menu link to download a portable copy of your medical information.    Additional Information    If you have questions, please visit the Frequently Asked Questions section of the MyChart website at [email or web address] Remember, MyChart is NOT to be used for urgent needs. For medical emergencies, dial 911.

## 2012-03-26 NOTE — Other (Signed)
ekg and comparison assessed by Dr Kapros.  Pt cleared for surgery.

## 2012-03-29 ENCOUNTER — Inpatient Hospital Stay: Payer: BLUE CROSS/BLUE SHIELD

## 2012-03-29 LAB — GLUCOSE, POC
Glucose (POC): 105 mg/dL (ref 65–105)
Glucose (POC): 120 mg/dL — ABNORMAL HIGH (ref 65–105)

## 2012-03-29 MED ORDER — MIDAZOLAM 1 MG/ML IJ SOLN
1 mg/mL | INTRAMUSCULAR | Status: DC | PRN
Start: 2012-03-29 — End: 2012-03-29
  Administered 2012-03-29: 21:00:00 via INTRAVENOUS

## 2012-03-29 MED ADMIN — lactated ringers infusion: INTRAVENOUS | @ 18:00:00 | NDC 00409795309

## 2012-03-29 MED ADMIN — acetaminophen (OFIRMEV) infusion: INTRAVENOUS | @ 22:00:00 | NDC 43825010201

## 2012-03-29 MED ADMIN — fentaNYL citrate (PF) injection: INTRAVENOUS | @ 21:00:00 | NDC 10019003339

## 2012-03-29 MED ADMIN — PHENYLephrine (NEOSYNEPHRINE) 10 mg in 0.9% sodium chloride 250 mL infusion: INTRAVENOUS | @ 21:00:00 | NDC 00641614201

## 2012-03-29 MED ADMIN — lactated ringers infusion: INTRAVENOUS | @ 21:00:00 | NDC 00338011704

## 2012-03-29 MED ADMIN — edrophonium (ENLON) 10 mg/mL injection: INTRAVENOUS | @ 22:00:00 | NDC 10019087339

## 2012-03-29 MED ADMIN — rocuronium (ZEMURON) injection: INTRAVENOUS | @ 22:00:00 | NDC 00781322075

## 2012-03-29 MED ADMIN — fentaNYL citrate (PF) injection 25 mcg: INTRAVENOUS | @ 23:00:00 | NDC 00641602701

## 2012-03-29 MED ADMIN — lidocaine (PF) (XYLOCAINE) 20 mg/mL (2 %) injection: INTRAVENOUS | @ 21:00:00 | NDC 63323020805

## 2012-03-29 MED ADMIN — HYDROmorphone (PF) (DILAUDID) injection: INTRAVENOUS | @ 22:00:00 | NDC 00409336501

## 2012-03-29 MED ADMIN — ondansetron (ZOFRAN) injection: INTRAVENOUS | @ 22:00:00 | NDC 00143989105

## 2012-03-29 MED ADMIN — fentaNYL citrate (PF) 50 mcg/mL injection: INTRAVENOUS | @ 23:00:00 | NDC 00641602701

## 2012-03-29 MED ADMIN — succinylcholine (ANECTINE) injection: INTRAVENOUS | @ 21:00:00 | NDC 00409662902

## 2012-03-29 MED ADMIN — dexamethasone (DECADRON) 4 mg/mL injection: INTRAVENOUS | @ 22:00:00 | NDC 00517490525

## 2012-03-29 MED ADMIN — rocuronium (ZEMURON) injection: INTRAVENOUS | @ 21:00:00 | NDC 00781322075

## 2012-03-29 MED ADMIN — ketorolac (TORADOL) injection: INTRAVENOUS | @ 22:00:00 | NDC 00517080125

## 2012-03-29 MED ADMIN — glycopyrrolate (ROBINUL) injection: INTRAMUSCULAR | @ 22:00:00 | NDC 10019001639

## 2012-03-29 MED ADMIN — levofloxacin (LEVAQUIN) 750 mg in D5W IVPB: INTRAVENOUS | @ 21:00:00 | NDC 00781334355

## 2012-03-29 MED ADMIN — propofol (DIPRIVAN) 10 mg/mL injection: INTRAVENOUS | @ 21:00:00 | NDC 00703285604

## 2012-03-29 MED ADMIN — bupivacaine-EPINEPHrine (PF) (SENSORCAINE PF) 0.5 %-1:200,000 injection: SUBCUTANEOUS | @ 22:00:00 | NDC 63323046237

## 2012-03-29 MED FILL — LACTATED RINGERS IV: INTRAVENOUS | Qty: 1000

## 2012-03-29 MED FILL — CONRAY 60 % INJECTION SOLUTION: 60 % | INTRAMUSCULAR | Qty: 50

## 2012-03-29 MED FILL — SENSORCAINE-MPF/EPINEPHRINE 0.5 %-1:200,000 INJECTION SOLUTION: 0.5 %-1:200,000 | INTRAMUSCULAR | Qty: 30

## 2012-03-29 MED FILL — FENTANYL CITRATE (PF) 50 MCG/ML IJ SOLN: 50 mcg/mL | INTRAMUSCULAR | Qty: 2

## 2012-03-29 MED FILL — ONDANSETRON (PF) 4 MG/2 ML INJECTION: 4 mg/2 mL | INTRAMUSCULAR | Qty: 2

## 2012-03-29 MED FILL — DEXAMETHASONE SODIUM PHOSPHATE 10 MG/ML IJ SOLN: 10 mg/mL | INTRAMUSCULAR | Qty: 1

## 2012-03-29 MED FILL — MIDAZOLAM 1 MG/ML IJ SOLN: 1 mg/mL | INTRAMUSCULAR | Qty: 2

## 2012-03-29 MED FILL — OFIRMEV 1,000 MG/100 ML (10 MG/ML) INTRAVENOUS SOLUTION: 1000 mg/100 mL (10 mg/mL) | INTRAVENOUS | Qty: 100

## 2012-03-29 MED FILL — HYDROMORPHONE (PF) 2 MG/ML IJ SOLN: 2 mg/mL | INTRAMUSCULAR | Qty: 1

## 2012-03-29 MED FILL — LEVOFLOXACIN IN D5W 750 MG/150 ML IV PIGGY BACK: 750 mg/150 mL | INTRAVENOUS | Qty: 150

## 2012-03-29 MED FILL — FENTANYL CITRATE (PF) 50 MCG/ML IJ SOLN: 50 mcg/mL | INTRAMUSCULAR | Qty: 5

## 2012-03-29 NOTE — Other (Signed)
Patient up to void in pre op holding

## 2012-03-29 NOTE — H&P (Signed)
H&P    Assessment:   GALLSTONES    Plan:   Cholecystectomy.  Discussed the risk of surgery including bleeding, infection, injury to adjacent structures, and the risks of general anesthetic.  The patient understands the risks; any and all questions were answered to the patient's satisfaction.    Subjective:      Stephanie Randolph is a 61 y.o. female who presents for above procedure.  See prior office H&P for details.     Objective:   Blood pressure 98/52, pulse 78, temperature 98.2 ??F (36.8 ??C), resp. rate 18, height 5\' 7"  (1.702 m), weight 154 lb (69.854 kg), SpO2 99.00%.  Temp (24hrs), Avg:98.2 ??F (36.8 ??C), Min:98.2 ??F (36.8 ??C), Max:98.2 ??F (36.8 ??C)      Physical Exam:  PHYSICAL EXAM:    Chest:   [x]    CTA bialterally, no wheezing/rhonchi/rales/crackles    []    wheezing     []    rhonchi     []    crackles     []    use of accessory muscles    Heart:  [x]    regular rate and rhythm     [x]    No murmurs/rubs/gallops    []    irregular rhythm     []    Murmur     []    Rubs     []    Gallops    Breast: [x]    deferred       ABD:    [x]    soft    [x]    non distended   [x]     non tender   [x]    NABS            Past Medical History   Diagnosis Date   ??? Diabetes    ??? Hypertension    ??? Other ill-defined conditions      hypercholesteremia   ??? Hypercholesterolemia    ??? Arthritis      knees     Past Surgical History   Procedure Laterality Date   ??? Hx gi       colonoscopies x2   ??? Hx gyn       tubal ligation    ??? Hx gyn       1 vaginal birth      Family History   Problem Relation Age of Onset   ??? Hypertension Mother    ??? Diabetes Father    ??? Hypertension Father    ??? Cancer Maternal Aunt      breast cancer   ??? Cancer Maternal Grandmother      unknown     History     Social History   ??? Marital Status: MARRIED     Spouse Name: N/A     Number of Children: N/A   ??? Years of Education: N/A     Social History Main Topics   ??? Smoking status: Never Smoker    ??? Smokeless tobacco: Never Used   ??? Alcohol Use: No   ??? Drug Use: No   ??? Sexually  Active: Not on file     Other Topics Concern   ??? Not on file     Social History Narrative   ??? No narrative on file      Prior to Admission medications    Medication Sig Start Date End Date Taking? Authorizing Provider   TRAMADOL HCL (TRAMADOL PO) Take  by mouth as needed.   Yes Historical Provider   CIPROFLOXACIN HCL (CIPRO PO) Take  by mouth.   Yes Historical Provider   metformin (GLUCOPHAGE) 500 mg tablet Take 1,000 mg by mouth two (2) times a day. 10/18/09  Yes Phys Other, MD   simvastatin (ZOCOR) 40 mg tablet Take 40 mg by mouth daily.   Yes Phys Other, MD   triamterene-hydrochlorothiazide (MAXZIDE) 37.5-25 mg per tablet Take 1 Tab by mouth daily.   Yes Phys Other, MD   amlodipine (NORVASC) 5 mg tablet Take 5 mg by mouth daily.   Yes Phys Other, MD   cholecalciferol, vitamin d3, (VITAMIN D3) 1,000 unit tablet Take 1,000 Units by mouth daily.   Yes Phys Other, MD     ALLERGIES:    Allergies   Allergen Reactions   ??? Ampicillin Rash       Review of Systems:  (see office H&P, changes noted below)  [x]          None                Signed By: Ernest Mallick, MD     March 29, 2012

## 2012-03-29 NOTE — Other (Signed)
Handoff Report from Operating Room to PACU    Report received from A. Maryellen Pile, RN and E. Providence Lanius, CRNA regarding [place]k [place].      Surgeon(s):  Ernest Mallick, MD  And Procedure(s) (LRB):  ROBOTIC ASSISTED CHOLECYSTECTOMY, WITH INTRAOPERTAIVE CHOLANGIOGRAM (N/A)  confirmed   with allergies, drains and dressings discussed.    Anesthesia type, drugs, patient history, complications, estimated blood loss, vital signs, intake and output, and last pain medication, lines, reversal medications and temperature were reviewed.

## 2012-03-29 NOTE — Op Note (Signed)
Date of Procedure:  03/29/2012    Pre-operative Diagnosis:  GALLSTONES    Post-operative Diagnosis:  GALLSTONES     Operative Procedure:  Laparoscopic cholecystectomy, robot assisted.    Surgeon:  Jekhi Bolin, MD    Anesthesia:  General    Estimated Blood Loss:  Minimal    Findings:  Chronic inflammation of the gallbladder, innumerable gallstones.  No CBD filling defect on cholangiogram.    Specimen:    ID Type Source Tests Collected by Time Destination   1 : gallbladder Preservative Gallbladder  Loc Feinstein J Tiegan Jambor, MD 03/29/2012 1651 Pathology        Complications:  None immediate    Description of Procedure:  After satisfactory induction of general anesthesia, the patient was kept in the supine position. The right arm was tucked with appropriate padding. The patient's abdomen was prepped and draped sterilely. After infusing a local anesthetic, a supraumbilical incision was made and a 12 mm Optiview trocar was placed intra-abdominally under visualization and pneumoperitoneum was achieved.     The abdomen was explored with findings noted above. An 8-mm port was placed in the patient's left abdomen and an 8-mm port with an additional 5-mm port placed in the patient's right abdomen. The patient was placed in reverse Trendelenburg and rotated towards the left and the robot was docked.    The gallbladder was retracted superior and laterally. The reflection of peritoneum was brought down, exposing Calot triangle. Cystic duct and cystic artery were both identified, separated surrounding tissues, and the junction of the cystic duct and common bile duct was identified obtaining the critical view.     A 3 non-metal clips were placed across the junction of the gallbladder and cystic duct.  The duct was transected with 2 clips left on the patient side. Next, the cystic artery was identified, separated from surrounding tissues. This was divided between clips with 1 clips left on the patient side.     The gallbladder was  separated from its bed in the liver using electrocautery. Once completed, the bed was noted to be hemostatic and the clips were identified and in tact. There was no extravasation of blood or bile.  The gallbladder was retrieved through the periumbilical port site using an Endopouch. The ports were removed sequentially under visualization. Pneumoperitoneum was evacuated.    A 0 Vicryl suture was used to close the periumbilical fascial defect, followed by closure of all skin incisions with subcuticular Monocryl and Dermabond. The patient was awoken and transferred to the recovery room in stable condition.  All instrument sponge and needle counts were correct.      Lorin Gawron J Leylanie Woodmansee, MD

## 2012-03-29 NOTE — Anesthesia Post-Procedure Evaluation (Signed)
+  Post-Anesthesia Evaluation and Assessment    Patient: Stephanie Randolph MRN: [id number]  SSN: [id number]   Date of Birth: August 18, 1950  Age: 61 y.o.  Sex: female      Cardiovascular Function/Vital Signs  Visit Vitals   Item Reading   ??? BP 103/73   ??? Pulse 94   ??? Temp 36.6 ??C (97.9 ??F)   ??? Resp 12   ??? Ht 5\' 7"  (1.702 m)   ??? Wt 69.854 kg (154 lb)   ??? BMI 24.11 kg/m2   ??? SpO2 92%       Patient is status post Procedure(s) with comments:  ROBOTIC ASSISTED CHOLECYSTECTOMY, WITH INTRAOPERTAIVE CHOLANGIOGRAM.    Nausea/Vomiting: Controlled.    Postoperative hydration reviewed and adequate.    Pain:  Pain Scale 1: Numeric (0 - 10) (03/29/12 1805)  Pain Intensity 1: 2 (03/29/12 1805)   Managed.    Neurological Status:   Neuro (WDL): Exceptions to WDL (03/29/12 1742)   At baseline.    Mental Status and Level of Consciousness: Arousable.    Pulmonary Status:   O2 Device: Room air (03/29/12 1755)   Adequate oxygenation and airway patent.    Complications related to anesthesia: None    Post-anesthesia assessment completed. No concerns.    Signed By: Adora Fridge, MD    03/29/2012

## 2012-03-29 NOTE — Other (Signed)
BS 120

## 2012-03-29 NOTE — Anesthesia Pre-Procedure Evaluation (Signed)
Anesthetic History   No history of anesthetic complications           Review of Systems / Medical History  Patient summary reviewed, nursing notes reviewed and pertinent labs reviewed    Pulmonary  Within defined limits               Neuro/Psych   Within defined limits           Cardiovascular    Hypertension            Exercise tolerance: >4 METS     GI/Hepatic/Renal  Within defined limits                Endo/Other    Diabetes: well controlled, type 2    Arthritis     Other Findings            Physical Exam    Airway  Mallampati: II  TM Distance: 4 - 6 cm  Neck ROM: normal range of motion   Mouth opening: Normal     Cardiovascular    Rhythm: regular  Rate: normal         Dental  No notable dental hx       Pulmonary  Breath sounds clear to auscultation               Abdominal         Other Findings            Anesthetic Plan    ASA: 2  Anesthesia type: general    Monitoring Plan: BIS      Induction: Intravenous  Anesthetic plan and risks discussed with: Patient

## 2012-03-29 NOTE — Op Note (Signed)
Date of Procedure:  03/29/2012    Pre-operative Diagnosis:  GALLSTONES    Post-operative Diagnosis:  GALLSTONES     Operative Procedure:  Laparoscopic cholecystectomy, robot assisted.    Surgeon:  Cory Roughen, MD    Anesthesia:  General    Estimated Blood Loss:  Minimal    Findings:  Chronic inflammation of the gallbladder, innumerable gallstones.  No CBD filling defect on cholangiogram.    Specimen:    ID Type Source Tests Collected by Time Destination   1 : gallbladder Preservative Gallbladder  Ernest Mallick, MD 03/29/2012 1651 Pathology        Complications:  None immediate    Description of Procedure:  After satisfactory induction of general anesthesia, the patient was kept in the supine position. The right arm was tucked with appropriate padding. The patient's abdomen was prepped and draped sterilely. After infusing a local anesthetic, a supraumbilical incision was made and a 12 mm Optiview trocar was placed intra-abdominally under visualization and pneumoperitoneum was achieved.     The abdomen was explored with findings noted above. An 8-mm port was placed in the patient's left abdomen and an 8-mm port with an additional 5-mm port placed in the patient's right abdomen. The patient was placed in reverse Trendelenburg and rotated towards the left and the robot was docked.    The gallbladder was retracted superior and laterally. The reflection of peritoneum was brought down, exposing Calot triangle. Cystic duct and cystic artery were both identified, separated surrounding tissues, and the junction of the cystic duct and common bile duct was identified obtaining the critical view.     A 3 non-metal clips were placed across the junction of the gallbladder and cystic duct.  The duct was transected with 2 clips left on the patient side. Next, the cystic artery was identified, separated from surrounding tissues. This was divided between clips with 1 clips left on the patient side.     The gallbladder was  separated from its bed in the liver using electrocautery. Once completed, the bed was noted to be hemostatic and the clips were identified and in tact. There was no extravasation of blood or bile.  The gallbladder was retrieved through the periumbilical port site using an Endopouch. The ports were removed sequentially under visualization. Pneumoperitoneum was evacuated.    A 0 Vicryl suture was used to close the periumbilical fascial defect, followed by closure of all skin incisions with subcuticular Monocryl and Dermabond. The patient was awoken and transferred to the recovery room in stable condition.  All instrument sponge and needle counts were correct.      Jameison Haji Blanche East, MD

## 2012-03-30 MED FILL — OFIRMEV 1,000 MG/100 ML (10 MG/ML) INTRAVENOUS SOLUTION: 1000 mg/100 mL (10 mg/mL) | INTRAVENOUS | Qty: 100

## 2012-03-30 MED FILL — EDROPHONIUM CHLORIDE 10 MG/ML IJ SOLN: 10 mg/mL | INTRAMUSCULAR | Qty: 15

## 2012-03-30 MED FILL — DEXAMETHASONE SODIUM PHOSPHATE 4 MG/ML IJ SOLN: 4 mg/mL | INTRAMUSCULAR | Qty: 5

## 2012-03-30 MED FILL — GLYCOPYRROLATE 0.2 MG/ML IJ SOLN: 0.2 mg/mL | INTRAMUSCULAR | Qty: 3

## 2012-03-30 MED FILL — MIDAZOLAM 1 MG/ML IJ SOLN: 1 mg/mL | INTRAMUSCULAR | Qty: 2

## 2012-03-30 MED FILL — QUELICIN 20 MG/ML INJECTION SOLUTION: 20 mg/mL | INTRAMUSCULAR | Qty: 10

## 2012-03-30 MED FILL — ROCURONIUM 10 MG/ML IV: 10 mg/mL | INTRAVENOUS | Qty: 5

## 2012-03-30 MED FILL — FENTANYL CITRATE (PF) 50 MCG/ML IJ SOLN: 50 mcg/mL | INTRAMUSCULAR | Qty: 4

## 2012-03-30 MED FILL — ONDANSETRON (PF) 4 MG/2 ML INJECTION: 4 mg/2 mL | INTRAMUSCULAR | Qty: 2

## 2012-03-30 MED FILL — LIDOCAINE (PF) 20 MG/ML (2 %) IJ SOLN: 20 mg/mL (2 %) | INTRAMUSCULAR | Qty: 5

## 2012-03-30 MED FILL — KETOROLAC TROMETHAMINE 30 MG/ML INJECTION: 30 mg/mL (1 mL) | INTRAMUSCULAR | Qty: 1

## 2012-03-30 MED FILL — DIPRIVAN 10 MG/ML INTRAVENOUS EMULSION: 10 mg/mL | INTRAVENOUS | Qty: 20

## 2012-03-30 MED FILL — PHENYLEPHRINE 10 MG/ML INJECTION: 10 mg/mL | INTRAMUSCULAR | Qty: 1

## 2012-04-19 NOTE — Progress Notes (Signed)
To: Stephanie Beola Cord, MD  From: Stephanie Mallick, MD    Thank you for referring Stephanie Randolph.  Hope you have a great holiday season -- Stephanie Randolph.    Encounter Date: 04/19/2012    Subjective:      Stephanie Randolph is a 61 y.o. female presents for postop care.    Has no complaints today.  Feels great, no pain.  Eating a regular diet without difficulty.   Bowel movements are regular.      Objective:     General:  alert, cooperative, no distress, appears stated age   Abdomen: soft, bowel sounds active, non-tender   Incision(s):   healing well, no drainage, no erythema, no hernia, no seroma, no swelling, no dehiscence, incision well approximated.     Assessment:     S/p laparoscopic cholecystectomy.  Pathology revealed chronic cholecystitis.  No CBD filling defects on intraoperative cholangiogram.   Doing well postoperatively.    Plan:     Patient understands no further follow-up required.    Knows to call for any concerns.      Stephanie Pressnell Blanche East, MD

## 2012-05-19 NOTE — Progress Notes (Signed)
Encounter Date: 03/26/2012  History and Physical    Assessment:   [x] Symptomatic gallstones.  [] Biliary dyskinesia.       Patient has failed conservative therapy and wishes to proceed with surgical intervention.      Plan/Recommendations/Medical Decision Making:   Cholecystectomy.    Discussed the risk of surgery including infection, hematoma, bleeding, bile duct or bowel injury, recurrence or persistence of symptoms, conversion to an open operation, retained CBD stones, possible JP drain, and the risks of general anesthetic.  All questions were answered to the patient's satisfaction.  Will schedule at patient's earliest convenience.      HPI:   Patient is a 62 y.o. female who presents with abdominal pain.   First noted symptoms this past Sunday.  Woke up with RUQ pain and n/v.  Happened again this week.  Sent for [place] -- gallstones.   Has had unchanged course since that time.    The pain is located in the epigastrium and in the RUQ without radiation.   Patient describes the pain as sharp.  Pain is brought on by foods.      Past Medical History   Diagnosis Date   ??? Diabetes    ??? Hypertension    ??? Other ill-defined conditions      hypercholesteremia   ??? Hypercholesterolemia    ??? Arthritis      knees      Past Surgical History   Procedure Laterality Date   ??? Hx gi       colonoscopies x2   ??? Hx gyn       tubal ligation    ??? Hx gyn       1 vaginal birth   ??? Pr abdomen surgery proc unlisted  03/29/12     ROBOTIC ASSISTED CHOLECYSTECTOMY, WITH INTRAOPERTAIVE CHOLANGIOGRAM      History   Substance Use Topics   ??? Smoking status: Never Smoker    ??? Smokeless tobacco: Never Used   ??? Alcohol Use: No      Family History   Problem Relation Age of Onset   ??? Hypertension Mother    ??? Diabetes Father    ??? Hypertension Father    ??? Cancer Maternal Aunt      breast cancer   ??? Cancer Maternal Grandmother      unknown       Current Outpatient Prescriptions   Medication Sig   ??? TRAMADOL HCL (TRAMADOL PO) Take  by mouth as needed.   ???  metformin (GLUCOPHAGE) 500 mg tablet Take 1,000 mg by mouth two (2) times a day.   ??? simvastatin (ZOCOR) 40 mg tablet Take 40 mg by mouth daily.   ??? triamterene-hydrochlorothiazide (MAXZIDE) 37.5-25 mg per tablet Take 1 Tab by mouth daily.   ??? amlodipine (NORVASC) 5 mg tablet Take 5 mg by mouth daily.   ??? cholecalciferol, vitamin d3, (VITAMIN D3) 1,000 unit tablet Take 1,000 Units by mouth daily.     No current facility-administered medications for this visit.       Allergies:  Allergies   Allergen Reactions   ??? Ampicillin Rash        Review of Systems:  10 systems reviewed.  See scanned sheet in "Media" section.  See HPI for pertinent positives and negatives.      Objective:   BP 109/74   Pulse 83   Ht 5' 6.5" (1.689 m)   Wt 155 lb (70.308 kg)   BMI 24.65 kg/m2   SpO2 98%  General appearance  Alert, cooperative, no distress, appears stated age   HEENT  Anicteric   Neck Supple   Back   No CVA tenderness   Lungs   CTAB   Heart  Regular rate and rhythm.  No murmur, rub or gallop   Abdomen   Soft. Bowel sounds normal. No masses,  No organomegaly.  Epigastrium TTP.  RUQ NT.   Extremities No CCE.   Pulses 2+ and symmetric   Skin Skin color, texture, turgor normal.    Lymph nodes No palpable lymphadenopathy.   Neurologic Without overt sensory or motor deficit       Signed By: Ernest Mallick, MD     May 19, 2012

## 2013-03-21 ENCOUNTER — Telehealth: Payer: Self-pay | Admitting: *Deleted

## 2013-03-21 NOTE — Telephone Encounter (Signed)
Obtained demo sheet w/ current phone number and called and confirmed 04/19/13 appt w/ pt.  Mailed before appt letter & packet to pt.  Took paperwork to [facility] for chart.

## 2013-03-28 ENCOUNTER — Telehealth: Payer: Self-pay | Admitting: *Deleted

## 2013-03-28 ENCOUNTER — Other Ambulatory Visit: Payer: Self-pay | Admitting: *Deleted

## 2013-03-28 DIAGNOSIS — C50412 Malignant neoplasm of upper-outer quadrant of left female breast: Secondary | ICD-10-CM

## 2013-03-28 DIAGNOSIS — C50112 Malignant neoplasm of central portion of left female breast: Secondary | ICD-10-CM

## 2013-03-28 NOTE — Telephone Encounter (Signed)
Spoke to pt about pain in right breast.  Pt c/o throbbing and burning.  Pt has not had mammo for 2014.  Called [facility] to get her established.  Informed pt that I will be her navigator.  Gave contact information.  Pt denies further needs or questions at this time.  Encourage pt to call with needs. Received verbal understanding.

## 2013-04-14 ENCOUNTER — Ambulatory Visit
Admission: RE | Admit: 2013-04-14 | Discharge: 2013-04-14 | Disposition: A | Discharge status: Home / Self Care | Payer: 59 | Source: Ambulatory Visit | Attending: Oncology | Admitting: Oncology

## 2013-04-14 DIAGNOSIS — C50412 Malignant neoplasm of upper-outer quadrant of left female breast: Secondary | ICD-10-CM

## 2013-04-19 ENCOUNTER — Ambulatory Visit (HOSPITAL_BASED_OUTPATIENT_CLINIC_OR_DEPARTMENT_OTHER): Payer: 59 | Admitting: Oncology

## 2013-04-19 ENCOUNTER — Encounter: Payer: Self-pay | Admitting: Oncology

## 2013-04-19 ENCOUNTER — Ambulatory Visit (HOSPITAL_BASED_OUTPATIENT_CLINIC_OR_DEPARTMENT_OTHER): Payer: 59

## 2013-04-19 ENCOUNTER — Other Ambulatory Visit (HOSPITAL_BASED_OUTPATIENT_CLINIC_OR_DEPARTMENT_OTHER): Payer: 59

## 2013-04-19 ENCOUNTER — Encounter (INDEPENDENT_AMBULATORY_CARE_PROVIDER_SITE_OTHER): Payer: Self-pay

## 2013-04-19 ENCOUNTER — Encounter: Payer: Self-pay | Admitting: *Deleted

## 2013-04-19 VITALS — BP 134/90 | HR 105 | Temp 98.3°F | Resp 18 | Ht 66.0 in | Wt 207.5 lb

## 2013-04-19 DIAGNOSIS — G609 Hereditary and idiopathic neuropathy, unspecified: Secondary | ICD-10-CM

## 2013-04-19 DIAGNOSIS — E039 Hypothyroidism, unspecified: Secondary | ICD-10-CM

## 2013-04-19 DIAGNOSIS — C50412 Malignant neoplasm of upper-outer quadrant of left female breast: Secondary | ICD-10-CM

## 2013-04-19 DIAGNOSIS — Z853 Personal history of malignant neoplasm of breast: Secondary | ICD-10-CM

## 2013-04-19 DIAGNOSIS — C50112 Malignant neoplasm of central portion of left female breast: Secondary | ICD-10-CM

## 2013-04-19 LAB — COMPREHENSIVE METABOLIC PANEL (CC13)
Albumin: 3.7 g/dL (ref 3.5–5.0)
Anion Gap: 9 mEq/L (ref 3–11)
CO2: 26 mEq/L (ref 22–29)
Chloride: 108 mEq/L (ref 98–109)
Creatinine: 0.8 mg/dL (ref 0.6–1.1)
Sodium: 142 mEq/L (ref 136–145)
Total Bilirubin: 0.44 mg/dL (ref 0.20–1.20)

## 2013-04-19 LAB — CBC WITH DIFFERENTIAL/PLATELET
HCT: 39.8 % (ref 34.8–46.6)
MCH: 27.2 pg (ref 25.1–34.0)
MCHC: 31.3 g/dL — ABNORMAL LOW (ref 31.5–36.0)
MONO#: 0.8 10*3/uL (ref 0.1–0.9)
NEUT%: 67.6 % (ref 38.4–76.8)
RDW: 13.4 % (ref 11.2–14.5)

## 2013-04-19 NOTE — Progress Notes (Signed)
Went to Dr. Darrall Dears office & verified that chart is in his box.

## 2013-04-19 NOTE — Progress Notes (Signed)
Checked in new patient with no financial issues. She has [facility] packet and has not been to [place].

## 2013-04-19 NOTE — Progress Notes (Signed)
ID: Aggie Moats OB: May 13, 1950  MR#: [id number]  WUJ#:[id number]  PCP: No primary provider on file.  Dr [place] Snow GYN:   SU:  OTHER MD:   CHIEF COMPLAINT: "I used to be followed by Dr. Nancie Neas, but she left."  HISTORY OF PRESENT ILLNESS: Latrese developed pain in her left breast sometime in the spring of 2009. She had mammography at [facility] around that time, but it was nondiagnostic. Soon thereafter she retired and moved to [place]. As the pain persisted, she was evaluated there and repeat left mammography in July of 2009 showed an abnormality requiring further evaluation. She had ultrasound and MRI which showed a suspicious lesion in the upper outer quadrant of the left breast. 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 The tumor was an invasive ductal carcinoma, grade 3, measuring 1.5 cm. It involved 1 of 13 axillary lymph nodes. 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  Adjuvantly the patient was treated by Dr. Kerrin Mo with 6 cycles of cyclophosphamide and docetaxel. She then received adjuvant radiation therapy, completed in February of 2000.    Her subsequent history is as detailed below  INTERVAL HISTORY: Lolly has been followed most recently by Dr. Nancie Neas at [facility], but Dr. Lyman Bishop recently relocated. The patient is here today to establish herself in my practice.   REVIEW OF SYSTEMS: Her biggest concern is that she developed pain in her left breast. Since this was the presenting symptom initially she is very worried that this might her percent and your cancer. She did just have diagnostic mammography which suggests an area of fat necrosis. However her tumor was managed initially so this is not entirely reassuring to her. Aside from this breast discomfort she has a variety of symptoms which are not new and likely not related to  her history of breast cancer. These include sleeping on more than one pillow, for comfort not because of shortness of breath. She has mild seasonal allergies. She has arthritis aches and pains here in there, not more frequent or intense than prior, and her right hip sometimes hurts when she walks up stairs. She has an underactive thyroid. 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 A detailed review of systems today was otherwise noncontributory  PAST MEDICAL HISTORY: No past medical history on file. Peripheral neuropathy secondary to chemotherapy, history of vertigo, history of low vitamin D, history of hypothyroidism  PAST SURGICAL HISTORY: No past surgical history on file. Status post foot surgery, shoulder arthroscopy, simple hysterectomy in her 30s (no salpingo-oophorectomy and of course history of left breast surgery  FAMILY HISTORY No family history on file. The patient's father died from renal failure at the age of 20. Her mother is living, age 62. The patient had one brother, no sisters. There is no history of breast or ovarian cancer in the family  GYNECOLOGIC HISTORY:   menarche age 62, first live birth age 62, she is GX P3. She underwent hysterectomy approximately age 78. She did not undergo salpingo-oophorectomy. She did not use hormone replacement.   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 She is divorced and lives alone, with no pets. Son Chrissie Noa "Darrell" arms Junior works as a Lawyer in [place]. Daughter Jeanene Erb works for [employer] as an Psychologist, educational in [place]. Daughter Orlie Pollen  lives in [place], [place] where she works as an Chiropodist. The patient has one granddaughter, unfortunately the subject of a custom to battle currently. She attends a new [employer] locally    ADVANCED DIRECTIVES:  in place. The patient has named her lawyer, Marita [place], as her  healthcare power of attorney    HEALTH MAINTENANCE: History  Substance Use Topics  . Smoking status: Not on file  . Smokeless tobacco: Not on file  . Alcohol Use: Not on file     Colonoscopy: 2010  PAP: Status post hysterectomy   Bone density: 2011/ osteopenia per patient report  Lipid panel:  Allergies  Allergen Reactions  . Adhesive [Tape]     No current outpatient prescriptions on file.   No current facility-administered medications for this visit.    OBJECTIVE: Middle-aged [place] American woman who appears stated age  62 Vitals:   04/19/13 1628  BP: 134/90  Pulse: 105  Temp: 98.3 F (36.8 C)  Resp: 18     Body mass index is 33.51 kg/(m^2).    ECOG FS:1 - Symptomatic but completely ambulatory  Ocular: Sclerae unicteric, pupils equal, round and reactive to light Ear-nose-throat: Oropharynx clear, dentition fair Lymphatic: No cervical or supraclavicular adenopathy Lungs no rales or rhonchi, good excursion bilaterally Heart regular rate and rhythm, no murmur appreciated Abd soft, nontender, positive bowel sounds MSK no focal spinal tenderness, no joint edema Neuro: non-focal, well-oriented, appropriate affect Breasts: I do not elicit any tenderness in the right breast, which shows no skin or nipple changes of concern. I do not palpate any masses. The right axilla is benign. The left breast is status post lumpectomy and radiation. There is no evidence of local recurrence. The left axilla is benign    LAB RESULTS:  CMP     Component Value Date/Time   NA 140 11/10/2008 1552   K 3.8 11/10/2008 1552   CL 109 11/10/2008 1552   CO2 26 11/10/2008 1552   GLUCOSE 91 11/10/2008 1552   BUN 12 11/10/2008 1552   CREATININE 0.72 11/10/2008 1552   CALCIUM 9.6 11/10/2008 1552    I No results found for this basename: SPEP, UPEP,  kappa and lambda light chains    Lab Results  Component Value Date   WBC 5.5 04/19/2013   NEUTROABS 3.7 04/19/2013   HGB 12.4 04/19/2013   HCT 39.8  04/19/2013   MCV 86.9 04/19/2013   PLT 269 04/19/2013      Chemistry      Component Value Date/Time   NA 140 11/10/2008 1552   K 3.8 11/10/2008 1552   CL 109 11/10/2008 1552   CO2 26 11/10/2008 1552   BUN 12 11/10/2008 1552   CREATININE 0.72 11/10/2008 1552      Component Value Date/Time   CALCIUM 9.6 11/10/2008 1552       No results found for this basename: LABCA2    No components found with this basename: LABCA125    No results found for this basename: INR,  in the last 168 hours  Urinalysis No results found for this basename: colorurine, appearanceur, labspec, phurine, glucoseu, hgbur, bilirubinur, ketonesur, proteinur, urobilinogen, nitrite, leukocytesur    STUDIES: Mm Digital Diagnostic Bilat  04/14/2013   CLINICAL DATA:  Status post left lumpectomy for breast cancer in 2009.  EXAM: DIGITAL DIAGNOSTIC  BILATERAL MAMMOGRAM WITH CAD  COMPARISON:  11/13/2008. More recent examinations at [facility] are being obtained for comparison.  ACR Breast  Density Category c: The breasts are heterogeneously dense, which may obscure small masses.  FINDINGS: Post lumpectomy changes on the left with progressive calcifications compatible with fat necrosis in the upper outer left breast. There is a stellate density at the location of the previously seen postoperative seroma. No findings elsewhere in either breast suspicious for malignancy.  Mammographic images were processed with CAD.  IMPRESSION: Probable normal postlumpectomy scar in the upper outer left breast. Once the patient's previous mammograms from [facility] are obtained comparison, a report addendum with followup recommendations will be issued.  RECOMMENDATION: Comparison with previous studies.  I have discussed the findings and recommendations with the patient. Results were also provided in writing at the conclusion of the visit. If applicable, a reminder letter will be sent to the patient regarding  the next appointment.  BI-RADS CATEGORY  0: Incomplete. Need additional imaging evaluation and/or prior mammograms for comparison.   Electronically Signed   By: Gordan Payment M.D.   On: 04/14/2013 08:37    ASSESSMENT: 62 y.o. [place] woman  (1) status post left lumpectomy and axillary lymph node dissection 12/03/2007 for a pT1c pNi, stage IIA invasive ductal carcinoma, grade 3, triple negative, with an MIB-1 of 58%.  (2) status post cyclophosphamide and docetaxel x6  (3) status post adjuvant radiation completed February of 2010.  PLAN: We spent the better part of today's hour-long appointment discussing the biology of breast cancer in general, and the specifics of the patient's tumor in particular. If Alleyah had just had her surgery, I would've quoted her a risk of recurrence with local treatment of approximately 43%. In general chemotherapy lowers that risk by about one third, and so the adjuvant! Program gel says chemotherapy reduced her risk from about 43% to about 27%.  However more than 5 years have passed, and triple negative tumors in particular tend to recur early, if they are to recur. Accordingly at this point I feel comfortable telling Bonney her risk of recurrence is less than 10%.  I would  be willing to release her to her primary care physician, but she did just meet him recently, she tells me, and she would prefer to be followed through [place] at least "a little longer." Accordingly she will see [place] again in April and followup with Dr. Jamelle Haring in October. She would like [place] to check her thyroid function and make those labs available to Dr. Jamelle Haring, which we will be glad to do.  I did reassure Vicci regarding the pain in her right breast. Even though unfortunately in her case pain was a presenting symptom, in general breast pain is due to mastalgia, which itself can be caused by variety of benign issues. I gave Manasvini a copy of her recent mammogram as well as her lab work today, all of which was  very reassuring.  She will see [place] again in April of 2015 and then yearly for at least 2 years. She knows to call for any problems that may develop before the next visit here.  Lowella Dell, MD   04/19/2013 4:43 PM

## 2013-04-20 ENCOUNTER — Encounter: Payer: Self-pay | Admitting: *Deleted

## 2013-04-20 ENCOUNTER — Telehealth: Payer: Self-pay | Admitting: Oncology

## 2013-04-20 NOTE — Progress Notes (Signed)
Mailed after appt letter to pt. 

## 2013-04-20 NOTE — Telephone Encounter (Signed)
lvm for pt regarding to April 2015 appt...mailed pt avs///appt sched and letter.Marland KitchenMarland Kitchen

## 2013-07-16 MED ORDER — HYDROMORPHONE (PF) 1 MG/ML IJ SOLN
1 mg/mL | INTRAMUSCULAR | Status: AC
Start: 2013-07-16 — End: 2013-07-16
  Administered 2013-07-16: 18:00:00 via INTRAMUSCULAR

## 2013-07-16 MED ORDER — DIPHTH,PERTUS(AC)TETANUS VAC(PF) 2.5 LF UNIT-8 MCG-5 LF/0.5 ML INJ
INTRAMUSCULAR | Status: DC
Start: 2013-07-16 — End: 2013-07-16

## 2013-07-16 MED ORDER — OXYCODONE-ACETAMINOPHEN 5 MG-325 MG TAB
5-325 mg | ORAL_TABLET | ORAL | Status: DC | PRN
Start: 2013-07-16 — End: 2020-02-09

## 2013-07-16 MED ORDER — DOXYCYCLINE HYCLATE 100 MG TAB
100 mg | ORAL_TABLET | Freq: Two times a day (BID) | ORAL | Status: AC
Start: 2013-07-16 — End: 2013-07-23

## 2013-07-16 MED ORDER — LIDOCAINE (PF) 10 MG/ML (1 %) IJ SOLN
10 mg/mL (1 %) | Freq: Once | INTRAMUSCULAR | Status: DC
Start: 2013-07-16 — End: 2013-07-16

## 2013-07-16 MED ORDER — DIPHTH,PERTUS(AC)TETANUS VAC(PF) 2.5 LF UNIT-8 MCG-5 LF/0.5 ML INJ
INTRAMUSCULAR | Status: AC
Start: 2013-07-16 — End: 2013-07-16
  Administered 2013-07-16: 20:00:00 via INTRAMUSCULAR

## 2013-07-16 MED ADMIN — ondansetron (ZOFRAN ODT) tablet 4 mg: ORAL | @ 18:00:00 | NDC 00781523806

## 2013-07-16 MED ADMIN — cefTRIAXone (ROCEPHIN) 1 g in lidocaine (PF) (XYLOCAINE) 10 mg/mL (1 %) IM injection: INTRAMUSCULAR | @ 18:00:00 | NDC 63323049227

## 2013-07-16 MED FILL — ONDANSETRON 4 MG TAB, RAPID DISSOLVE: 4 mg | ORAL | Qty: 1

## 2013-07-16 MED FILL — HYDROMORPHONE (PF) 1 MG/ML IJ SOLN: 1 mg/mL | INTRAMUSCULAR | Qty: 1

## 2013-07-16 MED FILL — BOOSTRIX TDAP 2.5 LF UNIT-8 MCG-5 LF/0.5 ML INTRAMUSCULAR SYRINGE: INTRAMUSCULAR | Qty: 1

## 2013-07-16 MED FILL — XYLOCAINE-MPF 10 MG/ML (1 %) INJECTION SOLUTION: 10 mg/mL (1 %) | INTRAMUSCULAR | Qty: 4

## 2013-07-16 NOTE — ED Notes (Signed)
Patient with abscess to right posterior neck/upper back.  Lesion red with white 0.2 mm white center.  Patient reports lesion x 1 year, irritation, redness, and white center x 1 week.

## 2013-07-16 NOTE — ED Notes (Signed)
PA Chrzanowski provided patient with verbal and written discharge instructions.  Patient discharged ambulatory with husband to transport home.

## 2013-07-16 NOTE — ED Provider Notes (Signed)
HPI Comments: Stephanie Randolph is a 63 y.o. female presents ambulatory to ED with cc of 2/10 erythematous, swollen skin lesion to R side of posterior neck for the past year. She does not know when last tetanus was. She denies fever, numbness or tingling.     PCP: Jerolyn Shin, MD    Pt reports drug allergies: ampicillin   PMhx is significant for: DM, HTN, hypercholesterolemia  SMhx is significant for: tubal ligation  Social hx: - Smoke (never), - EtOH, - Illicit Drugs      There are no other complaints, changes or physical findings at this time.  Written by Nunzio Cory, ED Scribe, as dictated by Annita Brod.      The history is provided by the patient.        Past Medical History   Diagnosis Date   ??? Diabetes    ??? Hypertension    ??? Other ill-defined conditions      hypercholesteremia   ??? Hypercholesterolemia    ??? Arthritis      knees        Past Surgical History   Procedure Laterality Date   ??? Hx gi       colonoscopies x2   ??? Hx gyn       tubal ligation    ??? Hx gyn       1 vaginal birth   ??? Pr abdomen surgery proc unlisted  03/29/12     ROBOTIC ASSISTED CHOLECYSTECTOMY, WITH INTRAOPERTAIVE CHOLANGIOGRAM          Family History   Problem Relation Age of Onset   ??? Hypertension Mother    ??? Diabetes Father    ??? Hypertension Father    ??? Cancer Maternal Aunt      breast cancer   ??? Cancer Maternal Grandmother      unknown        History     Social History   ??? Marital Status: MARRIED     Spouse Name: N/A     Number of Children: N/A   ??? Years of Education: N/A     Occupational History   ??? Not on file.     Social History Main Topics   ??? Smoking status: Never Smoker    ??? Smokeless tobacco: Never Used   ??? Alcohol Use: No   ??? Drug Use: No   ??? Sexual Activity: Not on file     Other Topics Concern   ??? Not on file     Social History Narrative                  ALLERGIES: Ampicillin      Review of Systems   Constitutional: Negative for fever.   Skin: Positive for color change and wound.   Neurological: Negative  for numbness.   All other systems reviewed and are negative.      Filed Vitals:    07/16/13 1304   BP: 149/75   Pulse: 76   Temp: 98.4 ??F (36.9 ??C)   Resp: 16   Height: 5\' 7"  (1.702 m)   Weight: 74.3 kg (163 lb 12.8 oz)   SpO2: 96%            Physical Exam   Constitutional: She is oriented to person, place, and time. She appears well-developed and well-nourished. No distress.   HENT:   Head: Normocephalic and atraumatic.   Right Ear: Tympanic membrane and external ear normal.   Left Ear:  Tympanic membrane and external ear normal.   Nose: Nose normal.   Mouth/Throat: Oropharynx is clear and moist and mucous membranes are normal. No oropharyngeal exudate.   Eyes: Conjunctivae and EOM are normal. Pupils are equal, round, and reactive to light. Right eye exhibits no discharge. Left eye exhibits no discharge. No scleral icterus.   Neck: Normal range of motion. Neck supple. No JVD present. No tracheal deviation present. No thyromegaly present.   Cardiovascular: Normal rate, regular rhythm, normal heart sounds and intact distal pulses.  Exam reveals no gallop and no friction rub.    No murmur heard.  Pulmonary/Chest: Effort normal and breath sounds normal. No stridor. No respiratory distress. She has no wheezes. She has no rales. She exhibits no tenderness.   Abdominal: Soft. Bowel sounds are normal. She exhibits no distension and no mass. There is no tenderness. There is no rebound and no guarding.   Musculoskeletal: Normal range of motion. She exhibits no edema or tenderness.   Lymphadenopathy:     She has no cervical adenopathy.   Neurological: She is alert and oriented to person, place, and time. She has normal strength and normal reflexes. No cranial nerve deficit or sensory deficit. She exhibits normal muscle tone. Coordination and gait normal.   Skin: Skin is warm and dry. No rash noted. She is not diaphoretic. No erythema. No pallor.   Red, swollen, fluctuant with some spontaneous drainage abscess to posterior neck    Psychiatric: She has a normal mood and affect.   Nursing note and vitals reviewed.  Written by Nunzio Cory, ED Scribe, as dictated by Annita Brod.     MDM  Number of Diagnoses or Management Options     Amount and/or Complexity of Data Reviewed  Review and summarize past medical records: yes    Patient Progress  Patient progress: stable      Procedures  Procedure Note - Incision and Drainage:   1:46 PM  Performed by: Dagoberto Ligas Ivey Cina  Complexity: Simple   Skin prepped with Sure-Clens.  Sterile field established.  Anesthesia achieved with 5 mLs of Lidocaine 1% with epinephrine using a local infiltration.   Abscess to neck was incised with # 11 blade, and large amount of sebum and pus drainage was expressed.  Wound probed and irrigated. Area was packed using 1/4 inch iodoform gauze.  Sterile dressing applied.    The procedure took 1-15 minutes, and pt tolerated well.  Written by Nunzio Cory, ED Scribe, as dictated by Annita Brod.     MEDICATIONS GIVEN:  Medications   cefTRIAXone (ROCEPHIN) 1 g in lidocaine (PF) (XYLOCAINE) 10 mg/mL (1 %) IM injection (not administered)   diph,Pertuss(AC),Tet Vac-PF (BOOSTRIX) suspension 0.5 mL (not administered)   HYDROmorphone (PF) (DILAUDID) injection 1 mg (1 mg IntraMUSCular Given 07/16/13 1359)   ondansetron (ZOFRAN ODT) tablet 4 mg (4 mg Oral Given 07/16/13 1349)   cefTRIAXone (ROCEPHIN) 1 g in lidocaine (PF) (XYLOCAINE) 10 mg/mL (1 %) IM injection (1 g IntraMUSCular Given 07/16/13 1350)       IMPRESSION:  1. Abscess        PLAN:  1. Rx Vibra-Tabs and Percocet  2. F/u with Dr. Merilynn Finland  Return to ED if worse     DISCHARGE NOTE  3:00 PM  Stephanie Randolph is ready for discharge. The pt's signs/symptoms, results, diagnosis and discharge instructions have been discussed w/ the pt and/or available family members. The pt conveys understanding and agreement with the diagnosis and plan.  There are no new physical findings, changes or complaints at this  time. Pt is recommended to f/u with Dr. Merilynn FinlandGanley or return to ED if condition worsens. Will discharge w/ Rx Vibra-Tabs and Percocet.  Written by Nunzio Coryhristopher Filosa, ED Scribe, as dictated by Annita BrodPA-C Chris Jovita Persing.

## 2013-07-16 NOTE — ED Notes (Signed)
PA Chrzanowski at bedside performing I&D of neck abscess.

## 2013-07-16 NOTE — ED Notes (Signed)
Right neck wound covered with ABD and secured with transpore tape.

## 2013-08-16 ENCOUNTER — Ambulatory Visit: Payer: 59 | Admitting: Physician Assistant

## 2013-08-16 ENCOUNTER — Other Ambulatory Visit: Payer: 59

## 2013-09-30 ENCOUNTER — Ambulatory Visit: Payer: 59 | Admitting: Physician Assistant

## 2013-09-30 ENCOUNTER — Other Ambulatory Visit: Payer: 59

## 2013-12-03 DEATH — deceased

## 2014-02-17 ENCOUNTER — Other Ambulatory Visit: Payer: Self-pay

## 2014-02-28 ENCOUNTER — Telehealth: Payer: Self-pay | Admitting: *Deleted

## 2014-02-28 NOTE — Telephone Encounter (Signed)
"  This is a message for Dr. Blenda Mounts.  I need Dr. Blenda Mounts to call in a prescription for me.  I'm having some dental work done and the dentist wants me to have some type of antibiotic before he goes in and do cleaning because I have the bone implant.  So if you would, call something into [facility] in [place] on the corner of [address].  If you have any questions call me."

## 2014-03-01 NOTE — Telephone Encounter (Signed)
I called and informed the patient that Dr. Blenda Mounts said an antibiotic is not needed because it's been over a year.  He said if they insist on you being on one tell them they can put you on Augmentin or Cephalexin.  She stated, "I told Dr. Redmond Pulling I didn't need it.  I was just going to take some left over Amoxicillin that I had and call it a day.  Thanks for calling."

## 2014-09-19 ENCOUNTER — Emergency Department (HOSPITAL_COMMUNITY): Payer: 59

## 2014-09-19 ENCOUNTER — Inpatient Hospital Stay (HOSPITAL_COMMUNITY)
Admission: EM | Admit: 2014-09-19 | Discharge: 2014-09-21 | DRG: 247 | Disposition: A | Discharge status: Home / Self Care | Payer: 59 | Attending: Internal Medicine | Admitting: Internal Medicine

## 2014-09-19 ENCOUNTER — Encounter (HOSPITAL_COMMUNITY): Payer: Self-pay | Admitting: *Deleted

## 2014-09-19 DIAGNOSIS — N63 Unspecified lump in breast: Secondary | ICD-10-CM | POA: Diagnosis present

## 2014-09-19 DIAGNOSIS — Z9861 Coronary angioplasty status: Secondary | ICD-10-CM | POA: Diagnosis not present

## 2014-09-19 DIAGNOSIS — I25119 Atherosclerotic heart disease of native coronary artery with unspecified angina pectoris: Secondary | ICD-10-CM | POA: Diagnosis present

## 2014-09-19 DIAGNOSIS — E785 Hyperlipidemia, unspecified: Secondary | ICD-10-CM | POA: Diagnosis not present

## 2014-09-19 DIAGNOSIS — I10 Essential (primary) hypertension: Secondary | ICD-10-CM | POA: Diagnosis present

## 2014-09-19 DIAGNOSIS — R0902 Hypoxemia: Secondary | ICD-10-CM | POA: Diagnosis present

## 2014-09-19 DIAGNOSIS — Z853 Personal history of malignant neoplasm of breast: Secondary | ICD-10-CM | POA: Diagnosis not present

## 2014-09-19 DIAGNOSIS — E876 Hypokalemia: Secondary | ICD-10-CM | POA: Diagnosis present

## 2014-09-19 DIAGNOSIS — C50412 Malignant neoplasm of upper-outer quadrant of left female breast: Secondary | ICD-10-CM | POA: Diagnosis present

## 2014-09-19 DIAGNOSIS — I252 Old myocardial infarction: Secondary | ICD-10-CM | POA: Diagnosis present

## 2014-09-19 DIAGNOSIS — Z91048 Other nonmedicinal substance allergy status: Secondary | ICD-10-CM | POA: Diagnosis not present

## 2014-09-19 DIAGNOSIS — E039 Hypothyroidism, unspecified: Secondary | ICD-10-CM | POA: Diagnosis present

## 2014-09-19 DIAGNOSIS — Z91040 Latex allergy status: Secondary | ICD-10-CM | POA: Diagnosis not present

## 2014-09-19 DIAGNOSIS — I213 ST elevation (STEMI) myocardial infarction of unspecified site: Secondary | ICD-10-CM | POA: Diagnosis not present

## 2014-09-19 DIAGNOSIS — Z923 Personal history of irradiation: Secondary | ICD-10-CM | POA: Diagnosis not present

## 2014-09-19 DIAGNOSIS — Z9221 Personal history of antineoplastic chemotherapy: Secondary | ICD-10-CM | POA: Diagnosis not present

## 2014-09-19 DIAGNOSIS — I214 Non-ST elevation (NSTEMI) myocardial infarction: Principal | ICD-10-CM

## 2014-09-19 DIAGNOSIS — R7309 Other abnormal glucose: Secondary | ICD-10-CM | POA: Diagnosis present

## 2014-09-19 DIAGNOSIS — I2510 Atherosclerotic heart disease of native coronary artery without angina pectoris: Secondary | ICD-10-CM | POA: Diagnosis not present

## 2014-09-19 DIAGNOSIS — R739 Hyperglycemia, unspecified: Secondary | ICD-10-CM | POA: Diagnosis not present

## 2014-09-19 DIAGNOSIS — R072 Precordial pain: Secondary | ICD-10-CM | POA: Diagnosis present

## 2014-09-19 HISTORY — DX: Disorder of thyroid, unspecified: E07.9

## 2014-09-19 HISTORY — DX: Malignant neoplasm of unspecified site of unspecified female breast: C50.919

## 2014-09-19 HISTORY — DX: Essential (primary) hypertension: I10

## 2014-09-19 HISTORY — DX: Personal history of irradiation: Z92.3

## 2014-09-19 HISTORY — DX: Non-ST elevation (NSTEMI) myocardial infarction: I21.4

## 2014-09-19 HISTORY — DX: Personal history of antineoplastic chemotherapy: Z92.21

## 2014-09-19 HISTORY — DX: Atherosclerotic heart disease of native coronary artery without angina pectoris: I25.10

## 2014-09-19 HISTORY — DX: Coronary angioplasty status: Z98.61

## 2014-09-19 LAB — I-STAT TROPONIN, ED
TROPONIN I, POC: 0.01 ng/mL (ref 0.00–0.08)
Troponin i, poc: 0.15 ng/mL (ref 0.00–0.08)

## 2014-09-19 LAB — CBC WITH DIFFERENTIAL/PLATELET
Basophils Absolute: 0 10*3/uL (ref 0.0–0.1)
Basophils Relative: 0 % (ref 0–1)
EOS PCT: 1 % (ref 0–5)
Eosinophils Absolute: 0.1 10*3/uL (ref 0.0–0.7)
HEMATOCRIT: 39.6 % (ref 36.0–46.0)
Hemoglobin: 12.7 g/dL (ref 12.0–15.0)
LYMPHS PCT: 10 % — AB (ref 12–46)
Lymphs Abs: 0.8 10*3/uL (ref 0.7–4.0)
MCH: 27.9 pg (ref 26.0–34.0)
MCHC: 32.1 g/dL (ref 30.0–36.0)
MCV: 87 fL (ref 78.0–100.0)
MONO ABS: 0.8 10*3/uL (ref 0.1–1.0)
Monocytes Relative: 10 % (ref 3–12)
Neutro Abs: 6.2 10*3/uL (ref 1.7–7.7)
Neutrophils Relative %: 79 % — ABNORMAL HIGH (ref 43–77)
Platelets: 283 10*3/uL (ref 150–400)
RBC: 4.55 MIL/uL (ref 3.87–5.11)
RDW: 13.7 % (ref 11.5–15.5)
WBC: 7.9 10*3/uL (ref 4.0–10.5)

## 2014-09-19 LAB — COMPREHENSIVE METABOLIC PANEL
ALT: 17 U/L (ref 14–54)
AST: 23 U/L (ref 15–41)
Albumin: 3.5 g/dL (ref 3.5–5.0)
Alkaline Phosphatase: 51 U/L (ref 38–126)
Anion gap: 9 (ref 5–15)
BILIRUBIN TOTAL: 0.5 mg/dL (ref 0.3–1.2)
BUN: 11 mg/dL (ref 6–20)
CHLORIDE: 105 mmol/L (ref 101–111)
CO2: 26 mmol/L (ref 22–32)
CREATININE: 0.87 mg/dL (ref 0.44–1.00)
Calcium: 9.4 mg/dL (ref 8.9–10.3)
GFR calc Af Amer: >60 mL/min (ref >60)
Glucose, Bld: 159 mg/dL — ABNORMAL HIGH (ref 65–99)
Potassium: 3.3 mmol/L — ABNORMAL LOW (ref 3.5–5.1)
SODIUM: 140 mmol/L (ref 135–145)
Total Protein: 6.9 g/dL (ref 6.5–8.1)

## 2014-09-19 LAB — D-DIMER, QUANTITATIVE: D-Dimer, Quant: 0.75 ug/mL-FEU — ABNORMAL HIGH (ref 0.00–0.48)

## 2014-09-19 MED ORDER — HEPARIN BOLUS VIA INFUSION
4000.0000 [IU] | Freq: Once | INTRAVENOUS | Status: AC
  Administered 2014-09-19: 4000 [IU] via INTRAVENOUS
  Filled 2014-09-19: qty 4000

## 2014-09-19 MED ORDER — ONDANSETRON HCL 4 MG/2ML IJ SOLN: 4.0000 mg | Freq: Four times a day (QID) | INTRAMUSCULAR | Status: DC | PRN

## 2014-09-19 MED ORDER — METOPROLOL SUCCINATE ER 25 MG PO TB24
25.0000 mg | ORAL_TABLET | Freq: Every day | ORAL | Status: DC
  Administered 2014-09-20 – 2014-09-21 (×2): 25 mg via ORAL
  Filled 2014-09-19 (×3): qty 1

## 2014-09-19 MED ORDER — ATORVASTATIN CALCIUM 80 MG PO TABS
80.0000 mg | ORAL_TABLET | Freq: Every day | ORAL | Status: DC
  Administered 2014-09-20: 19:00:00 80 mg via ORAL
  Filled 2014-09-19 (×2): qty 1

## 2014-09-19 MED ORDER — POTASSIUM CHLORIDE CRYS ER 20 MEQ PO TBCR
10.0000 meq | EXTENDED_RELEASE_TABLET | Freq: Once | ORAL | Status: AC
  Administered 2014-09-19: 10 meq via ORAL
  Filled 2014-09-19: qty 1

## 2014-09-19 MED ORDER — ACETAMINOPHEN 325 MG PO TABS: 650.0000 mg | ORAL_TABLET | ORAL | Status: DC | PRN

## 2014-09-19 MED ORDER — AMLODIPINE BESY-BENAZEPRIL HCL 10-20 MG PO CAPS: 1.0000 | ORAL_CAPSULE | Freq: Every day | ORAL | Status: DC

## 2014-09-19 MED ORDER — NITROGLYCERIN 0.4 MG SL SUBL: 0.4000 mg | SUBLINGUAL_TABLET | SUBLINGUAL | Status: DC | PRN

## 2014-09-19 MED ORDER — ASPIRIN EC 81 MG PO TBEC
81.0000 mg | DELAYED_RELEASE_TABLET | Freq: Every day | ORAL | Status: DC
  Filled 2014-09-19: qty 1

## 2014-09-19 MED ORDER — LEVOTHYROXINE SODIUM 100 MCG PO TABS
100.0000 ug | ORAL_TABLET | Freq: Every day | ORAL | Status: DC
  Filled 2014-09-19 (×2): qty 1

## 2014-09-19 MED ORDER — BENAZEPRIL HCL 20 MG PO TABS
20.0000 mg | ORAL_TABLET | Freq: Every day | ORAL | Status: DC
  Administered 2014-09-21: 20 mg via ORAL
  Filled 2014-09-19 (×3): qty 1

## 2014-09-19 MED ORDER — AMLODIPINE BESYLATE 10 MG PO TABS
10.0000 mg | ORAL_TABLET | Freq: Every day | ORAL | Status: DC
  Administered 2014-09-20 – 2014-09-21 (×2): 10 mg via ORAL
  Filled 2014-09-19 (×3): qty 1

## 2014-09-19 MED ORDER — HEPARIN (PORCINE) IN NACL 100-0.45 UNIT/ML-% IJ SOLN
1000.0000 [IU]/h | INTRAMUSCULAR | Status: DC
  Administered 2014-09-19: 1000 [IU]/h via INTRAVENOUS
  Filled 2014-09-19: qty 250

## 2014-09-19 MED ORDER — IOHEXOL 350 MG/ML SOLN
80.0000 mL | Freq: Once | INTRAVENOUS | Status: AC | PRN
  Administered 2014-09-19: 80 mL via INTRAVENOUS

## 2014-09-19 NOTE — Progress Notes (Signed)
Received pt report from Annah,RN-ED.

## 2014-09-19 NOTE — ED Notes (Signed)
Patient transported to CT 

## 2014-09-19 NOTE — ED Notes (Signed)
Pt presents via [facility] c/o CP and SOB, sudden onset at work around 1445.  Responder at work gave 324 ASA and applied O2 resolving symptoms.  Pt pain free on arrival.  O2 90% on RA, 95% on 2L.  EKG showing IRBBB.  Hx: HTN, breast cancer.  Pt a x 4, NAD.

## 2014-09-19 NOTE — Progress Notes (Signed)
ANTICOAGULATION CONSULT NOTE - Initial Consult  Pharmacy Consult for Heparin Indication: chest pain/ACS  Allergies  Allergen Reactions  . Adhesive [Tape]     Itching   . Latex     Itching     Patient Measurements: Height: 5' 6.14" (168 cm) Weight: 197 lb 12.8 oz (89.721 kg) IBW/kg (Calculated) : 59.63 Heparin Dosing Weight: 79.1 kg  Vital Signs: Temp: 97.8 F (36.6 C) (05/17 1610) Temp Source: Oral (05/17 1610) BP: 135/99 mmHg (05/17 2030) Pulse Rate: 86 (05/17 2030)  Labs:  Recent Labs  09/19/14 1640  HGB 12.7  HCT 39.6  PLT 283  CREATININE 0.87    CrCl cannot be calculated (Unknown ideal weight.).   Medical History: Past Medical History  Diagnosis Date  . Hypertension   . Thyroid disease   . Cancer   . Status post chemotherapy   . History of radiation therapy     Medications:  Scheduled:  Infusions:   Assessment: 64 yo F presenting to ED on 09/19/2014 with CP and SOB. Pharmacy consulted for possible ACS with trop elevated to 0.15. CBC wnl with no reported s/s bleeding.   Goal of Therapy:  Heparin level 0.3-0.7 units/ml Monitor platelets by anticoagulation protocol: Yes   Plan:  - Initiate heparin 4000 units x 1, followed by 1000 units/hr - 6 hour HL - Daily HL/CBC - Monitor for s/s bleeding - F/u plans for cath   [facility] K. Velva Harman, PharmD, [place] Clinical Pharmacist - Resident Pager: [phone] Pharmacy: [phone] 09/19/2014 10:48 PM

## 2014-09-19 NOTE — Progress Notes (Signed)
Brooke Proctor [id number] Admission Data: 09/19/2014 11:24 PM Attending Provider: Pixie Casino, MD LSL:HTDS, Mali A, PA-C Code Status: Full  Brooke Proctor is a 64 y.o. female patient admitted from ED:  -No acute distress noted.  -No complaints of shortness of breath.  -No complaints of chest pain.   Cardiac Monitoring: Box # 31 in place. Cardiac monitor yields:normal sinus rhythm.  Blood pressure 119/89, pulse 85, temperature 97.8 F (36.6 C), temperature source Oral, resp. rate 18, height 5' 6.14" (1.68 m), weight 89.721 kg (197 lb 12.8 oz), SpO2 97 %.   IV Fluids:  IV in place, occlusive dsg intact without redness, IV cath hand right, condition patent and no redness and antecubital right, condition patent and no redness none.   Allergies:  Adhesive and Latex  Past Medical History:   has a past medical history of Hypertension; Thyroid disease; Cancer; Status post chemotherapy; and History of radiation therapy.  Past Surgical History:   has past surgical history that includes Lymph node dissection.  Social History:   reports that she has never smoked. She does not have any smokeless tobacco history on file. She reports that she does not drink alcohol or use illicit drugs.  Skin: NSI  Patient/Family orientated to room. Information packet given to patient/family. Admission inpatient armband information verified with patient/family to include name and date of birth and placed on patient arm. Side rails up x 2, fall assessment and education completed with patient/family. Patient/family able to verbalize understanding of risk associated with falls and verbalized understanding to call for assistance before getting out of bed. Call light within reach. Patient/family able to voice and demonstrate understanding of unit orientation instructions.

## 2014-09-19 NOTE — ED Provider Notes (Addendum)
CSN: [id number]     Arrival date & time 09/19/14  1600 History   First MD Initiated Contact with Patient 09/19/14 1607     Chief Complaint  Patient presents with  . Chest Pain  . Shortness of Breath     Patient is a 64 y.o. female presenting with chest pain and shortness of breath. The history is provided by the patient and the EMS personnel. No language interpreter was used.  Chest Pain Associated symptoms: shortness of breath   Shortness of Breath Associated symptoms: chest pain    Ms. Caterino presents for evaluation of chest pain and shortness of breath.  She was at work this afternoon and was walking back to her desk.  She developed central chest pain that radiated to her back.  She had associated diaphoresis and sob.  She was asymptomatic at the time of EMS arrival.  Her SpO2 was noted to be 90 by first responders.  She received ASA 324 prior to ED arrival.  Sxs resolved at rest.  No fever, cough, vomiting, diarrhea, abdominal pain, lower extremity edema.  She has a hx/o breast cancer and HTN.  Sxs were severe, waxing and waning, currently resolved.   Past Medical History  Diagnosis Date  . Hypertension   . Thyroid disease   . Cancer   . Status post chemotherapy   . History of radiation therapy    Past Surgical History  Procedure Laterality Date  . Lymph node dissection     No family history on file. History  Substance Use Topics  . Smoking status: Never Smoker   . Smokeless tobacco: Not on file  . Alcohol Use: No   OB History    No data available     Review of Systems  Respiratory: Positive for shortness of breath.   Cardiovascular: Positive for chest pain.  All other systems reviewed and are negative.     Allergies  Adhesive and Latex  Home Medications   Prior to Admission medications   Medication Sig Start Date End Date Taking? Authorizing Provider  amLODipine-benazepril (LOTREL) 10-20 MG per capsule Take 1 capsule by mouth daily.   Yes Mali A Snow, PA-C   Biotin 1000 MCG tablet Take 1,000 mcg by mouth daily.   Yes Historical Provider, MD  ergocalciferol (VITAMIN D2) 50000 UNITS capsule Take 50,000 Units by mouth once a week. Take on sundays   Yes Historical Provider, MD  guaiFENesin 200 MG tablet Take 200 mg by mouth every 4 (four) hours as needed for cough or to loosen phlegm.   Yes Historical Provider, MD  levothyroxine (SYNTHROID, LEVOTHROID) 100 MCG tablet Take 100 mcg by mouth daily before breakfast.   Yes Greta O'Buch, PA-C  Omega-3 Fatty Acids (FISH OIL) 1000 MG CAPS Take 1,000 mg by mouth daily.   Yes Historical Provider, MD   BP 135/99 mmHg  Pulse 86  Temp(Src) 97.8 F (36.6 C) (Oral)  Resp 14  SpO2 100% Physical Exam  Constitutional: She is oriented to person, place, and time. She appears well-developed and well-nourished.  HENT:  Head: Normocephalic and atraumatic.  Cardiovascular: Normal rate and regular rhythm.   No murmur heard. Pulmonary/Chest: Effort normal and breath sounds normal. No respiratory distress.  Abdominal: Soft. There is no tenderness. There is no rebound and no guarding.  Musculoskeletal: She exhibits no edema or tenderness.  Neurological: She is alert and oriented to person, place, and time.  Skin: Skin is warm and dry.  Psychiatric: She has a normal  mood and affect. Her behavior is normal.  Nursing note and vitals reviewed.   ED Course  Procedures including critical care time) CRITICAL CARE Performed by: Quintella Reichert   Total critical care time: 30 minutes  Critical care time was exclusive of separately billable procedures and treating other patients.  Critical care was necessary to treat or prevent imminent or life-threatening deterioration.  Critical care was time spent personally by me on the following activities: development of treatment plan with patient and/or surrogate as well as nursing, discussions with consultants, evaluation of patient's response to treatment, examination of  patient, obtaining history from patient or surrogate, ordering and performing treatments and interventions, ordering and review of laboratory studies, ordering and review of radiographic studies, pulse oximetry and re-evaluation of patient's condition.  Labs Review Labs Reviewed  COMPREHENSIVE METABOLIC PANEL - Abnormal; Notable for the following:    Potassium 3.3 (*)    Glucose, Bld 159 (*)    All other components within normal limits  CBC WITH DIFFERENTIAL/PLATELET - Abnormal; Notable for the following:    Neutrophils Relative % 79 (*)    Lymphocytes Relative 10 (*)    All other components within normal limits  D-DIMER, QUANTITATIVE - Abnormal; Notable for the following:    D-Dimer, Quant 0.75 (*)    All other components within normal limits  I-STAT TROPOININ, ED - Abnormal; Notable for the following:    Troponin i, poc 0.15 (*)    All other components within normal limits  HEPARIN LEVEL (UNFRACTIONATED)  CBC  TSH  TROPONIN I  TROPONIN I  TROPONIN I  HEMOGLOBIN A1C  BRAIN NATRIURETIC PEPTIDE  PROTIME-INR  BASIC METABOLIC PANEL  LIPID PANEL  I-STAT TROPOININ, ED    Imaging Review Ct Angio Chest Pe W/cm &/or Wo Cm  09/19/2014   CLINICAL DATA:  Chest pain through to back, dyspnea, recent cold with productive cough, history breast cancer in remission for 5 years post chemotherapy and radiation therapy, hypertension  EXAM: CT ANGIOGRAPHY CHEST WITH CONTRAST  TECHNIQUE: Multidetector CT imaging of the chest was performed using the standard protocol during bolus administration of intravenous contrast. Multiplanar CT image reconstructions and MIPs were obtained to evaluate the vascular anatomy.  CONTRAST:  5mL OMNIPAQUE IOHEXOL 350 MG/ML SOLN IV  COMPARISON:  None  FINDINGS: Aorta normal caliber without aneurysm or dissection.  Pulmonary arteries well opacified and patent.  No evidence of pulmonary embolism.  No thoracic adenopathy.  Tiny fluid collection LEFT axilla 21 x 10 mm question  related to prior surgery.  Focal density centrally within the LEFT breast could represent scarring at a site of previous biopsy though mass not excluded, 10 mm diameter image 25.  16 x 13 mm probable cyst RIGHT lobe liver image 84.  Visualized upper abdomen otherwise normal appearance.  Small hiatal hernia.  4 mm RIGHT lower lobe nodule image 74, calcified.  Remaining lungs clear.  No infiltrate, pleural effusion or pneumothorax.  Scattered degenerative disc disease changes inferior cervical and thoracic spine.  No acute osseous abnormalities.  Review of the MIP images confirms the above findings.  IMPRESSION: No evidence of pulmonary embolism.  No acute intra-abdominal or intrapelvic abnormalities.  Small hiatal hernia.  Probable small RIGHT lobe hepatic cyst.  Small nodular focus centrally in LEFT breast 10 mm diameter question related to previous biopsy or scarring, recommend mammographic follow-up and breast exam to exclude tumor.  Small fluid collection LEFT axilla question related to prior surgery, recommend correlation with patient history.  Electronically Signed   By: Lavonia Dana M.D.   On: 09/19/2014 19:44   Dg Chest Port 1 View  09/19/2014   CLINICAL DATA:  Sudden onset of mid chest pain and shortness of breath.  EXAM: PORTABLE CHEST - 1 VIEW  COMPARISON:  01/29/2009  FINDINGS: Heart size and pulmonary vascularity are normal and the lungs are clear. Old healed left ninth rib fracture.  IMPRESSION: No acute abnormalities.   Electronically Signed   By: Lorriane Shire M.D.   On: 09/19/2014 16:58     EKG Interpretation   Date/Time:  Tuesday Sep 19 2014 16:06:16 EDT Ventricular Rate:  82 PR Interval:  167 QRS Duration: 108 QT Interval:  395 QTC Calculation: 461 R Axis:   -25 Text Interpretation:  Sinus rhythm Incomplete RBBB and LAFB RSR' in V1 or  V2, right VCD or RVH Probable left ventricular hypertrophy Confirmed by  Hazle Coca [phone]) on 09/19/2014 4:34:26 PM      MDM   Final  diagnoses:  MI, acute, non ST segment elevation    Pt presents for evaluation of chest pain, diaphoresis, EKG without ischemic changes and pt is pain free on ED arrival.  Pt received 324 ASA prior to ED arrival.  Initial concern for possible PE given pt's hypoxia/SOB - ddimer sent, which was elevated.  CT scan without evidence of PE.  Repeat troponin positive and hx concerning for cardiac chest pain - plan to admit to Cardiology.  Discussed with pt findings of NSTEMI and need for admission for further treatment and patient is in agreement with plan.  Discussed with Dr. Philbert Riser with Cardiology, who agrees to admit patient.      Quintella Reichert, MD 09/20/14 [id number]  Quintella Reichert, MD 09/20/14 [id number]

## 2014-09-19 NOTE — H&P (Addendum)
HPI: 64 yo woman with HTN and borderline HLD who presents with chest pain.  She reports she was at work and was walking out of bathroom when she felt diaphoretic.  Shortly after developed epigastric/substernal chest pressure, moderate intensity.  This radiated to her back.  It was associated with dyspnea.  Her coworkers noted that she didn't feel well, RN was not available and decision made that she needed to seek medical attention.  EMS arrived and gave her ASA with resolution of CP shortly after without receiving NTG.  She has been symptom free since.  ED work up revealed ECG without acute findings, initial troponin negative but repeat 0.15 prompting cardiology consult for admission.   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  Her granddaughter has her last appointment for her green card in [place] on the 19th at [facility].  She has plans to go with her as she has been helping her through this whole process and in order to avoid driving has plans to take a train at 6 pm tomorrow with her granddaughter to [place].    Review of Systems:     Cardiac Review of Systems: {Y] = yes [ ]  = no  Chest Pain [x    ]  Resting SOB [x   ] Exertional SOB  [x  ]  Orthopnea [  ]   Pedal Edema [   ]    Palpitations [  ] Syncope  [  ]   Presyncope [   ]  General Review of Systems: [Y] = yes [  ]=no Constitional: recent weight change [  ]; anorexia [  ]; fatigue [  ]; nausea [  ]; night sweats [  ]; fever [  ]; or chills [  ];                                                                      Dental: poor dentition[  ];   Eye : blurred vision [  ]; diplopia [   ]; vision changes [  ];  Amaurosis fugax[  ]; Resp: cough [  ];  wheezing[  ];  hemoptysis[  ]; shortness of breath[  ]; paroxysmal nocturnal dyspnea[  ]; dyspnea on exertion[  ]; or orthopnea[  ];  GI:  gallstones[  ], vomiting[  ];  dysphagia[  ]; melena[  ];  hematochezia [  ]; heartburn[  ];   GU: kidney stones [  ]; hematuria[  ];    dysuria [  ];  nocturia[  ];               Skin: rash [  ], swelling[  ];, hair loss[  ];  peripheral edema[  ];  or itching[  ]; Musculosketetal: myalgias[  ];  joint swelling[  ];  joint erythema[  ];  joint pain[  ];  back pain[  ];  Heme/Lymph: bruising[  ];  bleeding[  ];  anemia[  ];  Neuro: TIA[  ];  headaches[  ];  stroke[  ];  vertigo[  ];  seizures[  ];   paresthesias[  ];  difficulty walking[  ];  Psych:depression[  ]; anxiety[  ];  Endocrine: diabetes[  ];  thyroid dysfunction[  ];  Other:  Past Medical History  Diagnosis Date  . Hypertension   . Thyroid disease   . Cancer   . Status post chemotherapy   . History of radiation therapy     No current facility-administered medications on file prior to encounter.   Current Outpatient Prescriptions on File Prior to Encounter  Medication Sig Dispense Refill  . amLODipine-benazepril (LOTREL) 10-20 MG per capsule Take 1 capsule by mouth daily.    . Biotin 1000 MCG tablet Take 1,000 mcg by mouth daily.    Marland Kitchen levothyroxine (SYNTHROID, LEVOTHROID) 100 MCG tablet Take 100 mcg by mouth daily before breakfast.    . Omega-3 Fatty Acids (FISH OIL) 1000 MG CAPS Take 1,000 mg by mouth daily.       Allergies  Allergen Reactions  . Adhesive [Tape]     Itching   . Latex     Itching     History   Social History  . Marital Status: Divorced    Spouse Name: N/A  . Number of Children: N/A  . Years of Education: N/A   Occupational History  . Not on file.   Social History Main Topics  . Smoking status: Never Smoker   . Smokeless tobacco: Not on file  . Alcohol Use: No  . Drug Use: No  . Sexual Activity: Not on file   Other Topics Concern  . Not on file   Social History Narrative  . No narrative on file    No family history on file.  PHYSICAL EXAM: Filed Vitals:   09/19/14 2245  BP: 119/89  Pulse: 85  Temp:   Resp:    General:  Well appearing. No respiratory difficulty HEENT: [place], AT, PERRL, EOMI,  anicteric Neck: supple. no JVD. Carotids 2+ bilat; no bruits. No lymphadenopathy or thryomegaly appreciated. Cor: PMI nondisplaced. Regular rate & rhythm. No rubs, gallops or murmurs. Lungs: CTA  Abdomen: soft, nontender, nondistended. No hepatosplenomegaly. No bruits or masses. Good bowel sounds. Extremities: no cyanosis, clubbing, rash, edema Neuro: alert & oriented x 3, cranial nerves grossly intact. moves all 4 extremities w/o difficulty. Affect pleasant.  ECG: SR, LAFB, poor R wave progression  Results for orders placed or performed during the hospital encounter of 09/19/14 (from the past 24 hour(s))  Comprehensive metabolic panel     Status: Abnormal   Collection Time: 09/19/14  4:40 PM  Result Value Ref Range   Sodium 140 135 - 145 mmol/L   Potassium 3.3 (L) 3.5 - 5.1 mmol/L   Chloride 105 101 - 111 mmol/L   CO2 26 22 - 32 mmol/L   Glucose, Bld 159 (H) 65 - 99 mg/dL   BUN 11 6 - 20 mg/dL   Creatinine, Ser 0.87 0.44 - 1.00 mg/dL   Calcium 9.4 8.9 - 10.3 mg/dL   Total Protein 6.9 6.5 - 8.1 g/dL   Albumin 3.5 3.5 - 5.0 g/dL   AST 23 15 - 41 U/L   ALT 17 14 - 54 U/L   Alkaline Phosphatase 51 38 - 126 U/L   Total Bilirubin 0.5 0.3 - 1.2 mg/dL   GFR calc non Af Amer >60 >60 mL/min   GFR calc Af Amer >60 >60 mL/min   Anion gap 9 5 - 15  CBC with Differential     Status: Abnormal   Collection Time: 09/19/14  4:40 PM  Result Value Ref Range   WBC 7.9 4.0 - 10.5 K/uL   RBC 4.55 3.87 -  5.11 MIL/uL   Hemoglobin 12.7 12.0 - 15.0 g/dL   HCT 39.6 36.0 - 46.0 %   MCV 87.0 78.0 - 100.0 fL   MCH 27.9 26.0 - 34.0 pg   MCHC 32.1 30.0 - 36.0 g/dL   RDW 13.7 11.5 - 15.5 %   Platelets 283 150 - 400 K/uL   Neutrophils Relative % 79 (H) 43 - 77 %   Neutro Abs 6.2 1.7 - 7.7 K/uL   Lymphocytes Relative 10 (L) 12 - 46 %   Lymphs Abs 0.8 0.7 - 4.0 K/uL   Monocytes Relative 10 3 - 12 %   Monocytes Absolute 0.8 0.1 - 1.0 K/uL   Eosinophils Relative 1 0 - 5 %   Eosinophils Absolute 0.1 0.0 -  0.7 K/uL   Basophils Relative 0 0 - 1 %   Basophils Absolute 0.0 0.0 - 0.1 K/uL  D-dimer, quantitative     Status: Abnormal   Collection Time: 09/19/14  4:40 PM  Result Value Ref Range   D-Dimer, Quant 0.75 (H) 0.00 - 0.48 ug/mL-FEU  I-stat troponin, ED     Status: None   Collection Time: 09/19/14  4:43 PM  Result Value Ref Range   Troponin i, poc 0.01 0.00 - 0.08 ng/mL   Comment 3          I-stat troponin, ED     Status: Abnormal   Collection Time: 09/19/14  9:04 PM  Result Value Ref Range   Troponin i, poc 0.15 (HH) 0.00 - 0.08 ng/mL   Comment NOTIFIED PHYSICIAN    Comment 3           Ct Angio Chest Pe W/cm &/or Wo Cm  09/19/2014   CLINICAL DATA:  Chest pain through to back, dyspnea, recent cold with productive cough, history breast cancer in remission for 5 years post chemotherapy and radiation therapy, hypertension  EXAM: CT ANGIOGRAPHY CHEST WITH CONTRAST  TECHNIQUE: Multidetector CT imaging of the chest was performed using the standard protocol during bolus administration of intravenous contrast. Multiplanar CT image reconstructions and MIPs were obtained to evaluate the vascular anatomy.  CONTRAST:  31mL OMNIPAQUE IOHEXOL 350 MG/ML SOLN IV  COMPARISON:  None  FINDINGS: Aorta normal caliber without aneurysm or dissection.  Pulmonary arteries well opacified and patent.  No evidence of pulmonary embolism.  No thoracic adenopathy.  Tiny fluid collection LEFT axilla 21 x 10 mm question related to prior surgery.  Focal density centrally within the LEFT breast could represent scarring at a site of previous biopsy though mass not excluded, 10 mm diameter image 25.  16 x 13 mm probable cyst RIGHT lobe liver image 84.  Visualized upper abdomen otherwise normal appearance.  Small hiatal hernia.  4 mm RIGHT lower lobe nodule image 74, calcified.  Remaining lungs clear.  No infiltrate, pleural effusion or pneumothorax.  Scattered degenerative disc disease changes inferior cervical and thoracic spine.   No acute osseous abnormalities.  Review of the MIP images confirms the above findings.  IMPRESSION: No evidence of pulmonary embolism.  No acute intra-abdominal or intrapelvic abnormalities.  Small hiatal hernia.  Probable small RIGHT lobe hepatic cyst.  Small nodular focus centrally in LEFT breast 10 mm diameter question related to previous biopsy or scarring, recommend mammographic follow-up and breast exam to exclude tumor.  Small fluid collection LEFT axilla question related to prior surgery, recommend correlation with patient history.   Electronically Signed   By: Lavonia Dana M.D.   On: 09/19/2014 19:44  Dg Chest Port 1 View  09/19/2014   CLINICAL DATA:  Sudden onset of mid chest pain and shortness of breath.  EXAM: PORTABLE CHEST - 1 VIEW  COMPARISON:  01/29/2009  FINDINGS: Heart size and pulmonary vascularity are normal and the lungs are clear. Old healed left ninth rib fracture.  IMPRESSION: No acute abnormalities.   Electronically Signed   By: Lorriane Shire M.D.   On: 09/19/2014 16:58     ASSESSMENT: 64 yo woman with NSTEMI  PLAN/DISCUSSION: Admit to tele ASA and heparin, high dose statin Continue amlodipine/benazepril for HTN, currently well controlled Continue levothyroxine and check TSH NPO after MN A1c and lipids Echo Would consider early invasive strategy with cath in AM.  However, she would really like to leave tomorrow given above scenario.  If she is found to have a straight forward lesion she may be eligible for PCI and same day discharge.  I advised that we would re-evaluate in the morning once we had more information.

## 2014-09-20 ENCOUNTER — Telehealth: Payer: Self-pay | Admitting: Physician Assistant

## 2014-09-20 ENCOUNTER — Encounter (HOSPITAL_COMMUNITY)
Admission: EM | Disposition: A | Discharge status: Home / Self Care | Payer: 59 | Source: Home / Self Care | Attending: Internal Medicine

## 2014-09-20 ENCOUNTER — Encounter (HOSPITAL_COMMUNITY): Payer: Self-pay | Admitting: Cardiology

## 2014-09-20 ENCOUNTER — Inpatient Hospital Stay (HOSPITAL_COMMUNITY): Payer: 59

## 2014-09-20 DIAGNOSIS — Z9861 Coronary angioplasty status: Secondary | ICD-10-CM

## 2014-09-20 DIAGNOSIS — I2510 Atherosclerotic heart disease of native coronary artery without angina pectoris: Secondary | ICD-10-CM

## 2014-09-20 DIAGNOSIS — R739 Hyperglycemia, unspecified: Secondary | ICD-10-CM

## 2014-09-20 DIAGNOSIS — R7309 Other abnormal glucose: Secondary | ICD-10-CM

## 2014-09-20 DIAGNOSIS — E785 Hyperlipidemia, unspecified: Secondary | ICD-10-CM

## 2014-09-20 DIAGNOSIS — I214 Non-ST elevation (NSTEMI) myocardial infarction: Secondary | ICD-10-CM | POA: Insufficient documentation

## 2014-09-20 DIAGNOSIS — Z955 Presence of coronary angioplasty implant and graft: Secondary | ICD-10-CM | POA: Insufficient documentation

## 2014-09-20 HISTORY — PX: CARDIAC CATHETERIZATION: SHX172

## 2014-09-20 LAB — HEPARIN LEVEL (UNFRACTIONATED): HEPARIN UNFRACTIONATED: 0.54 [IU]/mL (ref 0.30–0.70)

## 2014-09-20 LAB — CBC
HEMATOCRIT: 36.8 % (ref 36.0–46.0)
HEMOGLOBIN: 11.7 g/dL — AB (ref 12.0–15.0)
MCH: 27.7 pg (ref 26.0–34.0)
MCHC: 31.8 g/dL (ref 30.0–36.0)
MCV: 87.2 fL (ref 78.0–100.0)
Platelets: 263 10*3/uL (ref 150–400)
RBC: 4.22 MIL/uL (ref 3.87–5.11)
RDW: 13.7 % (ref 11.5–15.5)
WBC: 6.9 10*3/uL (ref 4.0–10.5)

## 2014-09-20 LAB — BASIC METABOLIC PANEL
ANION GAP: 8 (ref 5–15)
BUN: 10 mg/dL (ref 6–20)
CHLORIDE: 107 mmol/L (ref 101–111)
CO2: 26 mmol/L (ref 22–32)
Calcium: 9.2 mg/dL (ref 8.9–10.3)
Creatinine, Ser: 0.77 mg/dL (ref 0.44–1.00)
GFR calc Af Amer: >60 mL/min (ref >60)
GFR calc non Af Amer: >60 mL/min (ref >60)
Glucose, Bld: 96 mg/dL (ref 65–99)
Potassium: 3.3 mmol/L — ABNORMAL LOW (ref 3.5–5.1)
SODIUM: 141 mmol/L (ref 135–145)

## 2014-09-20 LAB — BRAIN NATRIURETIC PEPTIDE: B NATRIURETIC PEPTIDE 5: 43.6 pg/mL (ref 0.0–100.0)

## 2014-09-20 LAB — TROPONIN I
TROPONIN I: 0.12 ng/mL — AB (ref <0.031)
Troponin I: 0.13 ng/mL — ABNORMAL HIGH (ref <0.031)
Troponin I: 0.12 ng/mL — ABNORMAL HIGH (ref <0.031)

## 2014-09-20 LAB — LIPID PANEL
CHOL/HDL RATIO: 5.2 ratio
CHOLESTEROL: 188 mg/dL (ref 0–200)
HDL: 36 mg/dL — ABNORMAL LOW (ref >40)
LDL Cholesterol: 136 mg/dL — ABNORMAL HIGH (ref 0–99)
Triglycerides: 80 mg/dL (ref <150)
VLDL: 16 mg/dL (ref 0–40)

## 2014-09-20 LAB — PROTIME-INR
INR: 1.1 (ref 0.00–1.49)
PROTHROMBIN TIME: 14.3 s (ref 11.6–15.2)

## 2014-09-20 LAB — TSH: TSH: 0.745 u[IU]/mL (ref 0.350–4.500)

## 2014-09-20 LAB — POCT ACTIVATED CLOTTING TIME: Activated Clotting Time: 368 seconds

## 2014-09-20 SURGERY — LEFT HEART CATH AND CORONARY ANGIOGRAPHY

## 2014-09-20 MED ORDER — SODIUM CHLORIDE 0.9 % IJ SOLN: 3.0000 mL | Freq: Two times a day (BID) | INTRAMUSCULAR | Status: DC

## 2014-09-20 MED ORDER — NITROGLYCERIN 0.2 MG/ML ON CALL CATH LAB
INTRAVENOUS | Status: DC | PRN
  Administered 2014-09-20 (×2): 200 ug via INTRACORONARY

## 2014-09-20 MED ORDER — BIVALIRUDIN BOLUS VIA INFUSION - CUPID
INTRAVENOUS | Status: DC | PRN
  Administered 2014-09-20: 67.425 mg via INTRAVENOUS

## 2014-09-20 MED ORDER — VERAPAMIL HCL 2.5 MG/ML IV SOLN
INTRAVENOUS | Status: AC
  Filled 2014-09-20: qty 2

## 2014-09-20 MED ORDER — HEPARIN SODIUM (PORCINE) 1000 UNIT/ML IJ SOLN
INTRAMUSCULAR | Status: AC
  Filled 2014-09-20: qty 1

## 2014-09-20 MED ORDER — LIDOCAINE HCL (PF) 1 % IJ SOLN
INTRAMUSCULAR | Status: AC
  Filled 2014-09-20: qty 30

## 2014-09-20 MED ORDER — ASPIRIN EC 81 MG PO TBEC
81.0000 mg | DELAYED_RELEASE_TABLET | Freq: Every day | ORAL | Status: DC
  Filled 2014-09-20: qty 1

## 2014-09-20 MED ORDER — HEPARIN (PORCINE) IN NACL 2-0.9 UNIT/ML-% IJ SOLN
INTRAMUSCULAR | Status: AC
  Filled 2014-09-20: qty 1500

## 2014-09-20 MED ORDER — NITROGLYCERIN 1 MG/10 ML FOR IR/CATH LAB
INTRA_ARTERIAL | Status: AC
  Filled 2014-09-20: qty 10

## 2014-09-20 MED ORDER — TICAGRELOR 90 MG PO TABS
ORAL_TABLET | ORAL | Status: AC
  Filled 2014-09-20: qty 2

## 2014-09-20 MED ORDER — TICAGRELOR 90 MG PO TABS
90.0000 mg | ORAL_TABLET | Freq: Two times a day (BID) | ORAL | Status: DC
  Administered 2014-09-20 – 2014-09-21 (×2): 90 mg via ORAL
  Filled 2014-09-20 (×3): qty 1

## 2014-09-20 MED ORDER — MORPHINE SULFATE 10 MG/ML IJ SOLN
INTRAMUSCULAR | Status: AC
  Filled 2014-09-20: qty 1

## 2014-09-20 MED ORDER — MIDAZOLAM HCL 2 MG/2ML IJ SOLN
INTRAMUSCULAR | Status: AC
  Filled 2014-09-20: qty 2

## 2014-09-20 MED ORDER — POTASSIUM CHLORIDE CRYS ER 20 MEQ PO TBCR
40.0000 meq | EXTENDED_RELEASE_TABLET | Freq: Once | ORAL | Status: AC
  Administered 2014-09-20: 40 meq via ORAL
  Filled 2014-09-20: qty 2

## 2014-09-20 MED ORDER — SODIUM CHLORIDE 0.9 % IV SOLN: 250.0000 mL | INTRAVENOUS | Status: DC | PRN

## 2014-09-20 MED ORDER — TICAGRELOR 90 MG PO TABS
ORAL_TABLET | ORAL | Status: DC | PRN
Start: 2014-09-20 — End: 2014-09-20
  Administered 2014-09-20: 180 mg via ORAL

## 2014-09-20 MED ORDER — MORPHINE SULFATE 10 MG/ML IJ SOLN
INTRAMUSCULAR | Status: DC | PRN
  Administered 2014-09-20: 2 mg via INTRAVENOUS

## 2014-09-20 MED ORDER — HEPARIN SODIUM (PORCINE) 1000 UNIT/ML IJ SOLN
INTRAMUSCULAR | Status: DC | PRN
  Administered 2014-09-20: 4500 [IU] via INTRAVENOUS

## 2014-09-20 MED ORDER — LEVOTHYROXINE SODIUM 100 MCG PO TABS
100.0000 ug | ORAL_TABLET | Freq: Every day | ORAL | Status: DC
  Administered 2014-09-20: 100 ug via ORAL
  Filled 2014-09-20: qty 1

## 2014-09-20 MED ORDER — VERAPAMIL HCL 2.5 MG/ML IV SOLN
INTRAVENOUS | Status: DC | PRN
  Administered 2014-09-20: 12:00:00 via INTRA_ARTERIAL

## 2014-09-20 MED ORDER — FENTANYL CITRATE (PF) 100 MCG/2ML IJ SOLN
INTRAMUSCULAR | Status: DC | PRN
  Administered 2014-09-20 (×2): 25 ug via INTRAVENOUS

## 2014-09-20 MED ORDER — MORPHINE SULFATE 2 MG/ML IJ SOLN: 2.0000 mg | INTRAMUSCULAR | Status: DC | PRN

## 2014-09-20 MED ORDER — ASPIRIN 81 MG PO CHEW
81.0000 mg | CHEWABLE_TABLET | ORAL | Status: AC
  Administered 2014-09-20: 81 mg via ORAL
  Filled 2014-09-20: qty 1

## 2014-09-20 MED ORDER — SODIUM CHLORIDE 0.9 % IJ SOLN: 3.0000 mL | INTRAMUSCULAR | Status: DC | PRN

## 2014-09-20 MED ORDER — ASPIRIN 81 MG PO CHEW
81.0000 mg | CHEWABLE_TABLET | Freq: Every day | ORAL | Status: DC
  Administered 2014-09-21: 81 mg via ORAL
  Filled 2014-09-20 (×2): qty 1

## 2014-09-20 MED ORDER — FENTANYL CITRATE (PF) 100 MCG/2ML IJ SOLN
INTRAMUSCULAR | Status: AC
  Filled 2014-09-20: qty 2

## 2014-09-20 MED ORDER — SODIUM CHLORIDE 0.9 % WEIGHT BASED INFUSION: 1.0000 mL/kg/h | INTRAVENOUS | Status: AC

## 2014-09-20 MED ORDER — BIVALIRUDIN 250 MG IV SOLR
250.0000 mg | INTRAVENOUS | Status: DC | PRN
  Administered 2014-09-20: 1.75 mg/kg/h via INTRAVENOUS

## 2014-09-20 MED ORDER — BIVALIRUDIN 250 MG IV SOLR
INTRAVENOUS | Status: AC
  Filled 2014-09-20: qty 250

## 2014-09-20 MED ORDER — MIDAZOLAM HCL 2 MG/2ML IJ SOLN
INTRAMUSCULAR | Status: DC | PRN
  Administered 2014-09-20 (×2): 1 mg via INTRAVENOUS

## 2014-09-20 MED ORDER — IOHEXOL 350 MG/ML SOLN
INTRAVENOUS | Status: DC | PRN
  Administered 2014-09-20: 210 mL via INTRA_ARTERIAL

## 2014-09-20 MED ORDER — SODIUM CHLORIDE 0.9 % IV SOLN: INTRAVENOUS | Status: DC

## 2014-09-20 SURGICAL SUPPLY — 23 items
BALLN EUPHORA RX 2.25X12 (BALLOONS) ×3
BALLN ~~LOC~~ EUPHORA RX 2.5X12 (BALLOONS) ×3
BALLN ~~LOC~~ EUPHORA RX 4.5X12 (BALLOONS) ×3
BALLOON EUPHORA RX 2.25X12 (BALLOONS) IMPLANT
BALLOON ~~LOC~~ EUPHORA RX 2.5X12 (BALLOONS) IMPLANT
BALLOON ~~LOC~~ EUPHORA RX 4.5X12 (BALLOONS) IMPLANT
CATH INFINITI 5 FR JL3.5 (CATHETERS) ×3 IMPLANT
CATH INFINITI 5FR ANG PIGTAIL (CATHETERS) ×3 IMPLANT
CATH INFINITI JR4 5F (CATHETERS) ×3 IMPLANT
CATH VISTA GUIDE 6FR JR4 (CATHETERS) ×1 IMPLANT
CATH VISTA GUIDE 6FR XBLAD3.5 (CATHETERS) ×1 IMPLANT
DEVICE RAD COMP TR BAND LRG (VASCULAR PRODUCTS) ×3 IMPLANT
GLIDESHEATH SLEND SS 6F .021 (SHEATH) ×3 IMPLANT
KIT ENCORE 26 ADVANTAGE (KITS) ×1 IMPLANT
KIT HEART LEFT (KITS) ×3 IMPLANT
PACK CARDIAC CATHETERIZATION (CUSTOM PROCEDURE TRAY) ×3 IMPLANT
STENT PROMUS PREM MR 2.5X16 (Permanent Stent) ×1 IMPLANT
STENT PROMUS PREM MR 4.0X16 (Permanent Stent) ×1 IMPLANT
SYR MEDRAD MARK V 150ML (SYRINGE) ×3 IMPLANT
TRANSDUCER W/STOPCOCK (MISCELLANEOUS) ×3 IMPLANT
TUBING CIL FLEX 10 FLL-RA (TUBING) ×3 IMPLANT
WIRE ASAHI PROWATER 180CM (WIRE) ×1 IMPLANT
WIRE SAFE-T 1.5MM-J .035X260CM (WIRE) ×3 IMPLANT

## 2014-09-20 NOTE — Progress Notes (Signed)
Report given to Nary, RN on [facility].  Dorthey Sawyer, RN

## 2014-09-20 NOTE — H&P (View-Only) (Signed)
Patient: Brooke Proctor / Admit Date: 09/19/2014 / Date of Encounter: 09/20/2014, 8:47 AM   Subjective: Patient says she does necessarily believe she needs to proceed with cath because she has never had heart problems before (despite being informed that her troponin is abnormal). It is extremely important to her that she accompany her granddaughter to [facility] tomorrow for her [employer]. She was planning on taking the train tonight - does not drive due to vertigo. No further symptoms this morning. She believes her symptoms were due to stress.   Objective: Telemetry: NSR Physical Exam: Blood pressure 126/87, pulse 80, temperature 98.4 F (36.9 C), temperature source Oral, resp. rate 18, height 5\' 6"  (1.676 m), weight 198 lb 3.2 oz (89.903 kg), SpO2 100 %. General: Well developed, well nourished AAF in no acute distress. Head: Normocephalic, atraumatic, sclera non-icteric, no xanthomas, nares are without discharge. Neck: Negative for carotid bruits. JVP not elevated. Lungs: Clear bilaterally to auscultation without wheezes, rales, or rhonchi. Breathing is unlabored. Heart: RRR S1 S2 without murmurs, rubs, or gallops.  Abdomen: Soft, non-tender, non-distended with normoactive bowel sounds. No rebound/guarding. Extremities: No clubbing or cyanosis. No edema. Distal pedal pulses are 2+ and equal bilaterally. Neuro: Alert and oriented X 3. Moves all extremities spontaneously. Psych:  Responds to questions appropriately with a normal affect.   Intake/Output Summary (Last 24 hours) at 09/20/14 0847 Last data filed at 09/20/14 0843  Gross per 24 hour  Intake    222 ml  Output    400 ml  Net   -178 ml    Inpatient Medications:  . amLODipine  10 mg Oral Daily   And  . benazepril  20 mg Oral Daily  . aspirin EC  81 mg Oral Daily  . atorvastatin  80 mg Oral q1800  . levothyroxine  100 mcg Oral QAC breakfast  . metoprolol succinate  25 mg Oral Daily   Infusions:  . heparin 1,000 Units/hr  (09/19/14 2256)    Labs:  Recent Labs  09/19/14 1640 09/20/14 0530  NA 140 141  K 3.3* 3.3*  CL 105 107  CO2 26 26  GLUCOSE 159* 96  BUN 11 10  CREATININE 0.87 0.77  CALCIUM 9.4 9.2    Recent Labs  09/19/14 1640  AST 23  ALT 17  ALKPHOS 51  BILITOT 0.5  PROT 6.9  ALBUMIN 3.5    Recent Labs  09/19/14 1640 09/20/14 0530  WBC 7.9 6.9  NEUTROABS 6.2  --   HGB 12.7 11.7*  HCT 39.6 36.8  MCV 87.0 87.2  PLT 283 263    Recent Labs  09/20/14 0158 09/20/14 0530  TROPONINI 0.13* 0.12*   Invalid input(s): POCBNP No results for input(s): HGBA1C in the last 72 hours.   Radiology/Studies:  Ct Angio Chest Pe W/cm &/or Wo Cm  09/19/2014   CLINICAL DATA:  Chest pain through to back, dyspnea, recent cold with productive cough, history breast cancer in remission for 5 years post chemotherapy and radiation therapy, hypertension  EXAM: CT ANGIOGRAPHY CHEST WITH CONTRAST  TECHNIQUE: Multidetector CT imaging of the chest was performed using the standard protocol during bolus administration of intravenous contrast. Multiplanar CT image reconstructions and MIPs were obtained to evaluate the vascular anatomy.  CONTRAST:  22mL OMNIPAQUE IOHEXOL 350 MG/ML SOLN IV  COMPARISON:  None  FINDINGS: Aorta normal caliber without aneurysm or dissection.  Pulmonary arteries well opacified and patent.  No evidence of pulmonary embolism.  No thoracic adenopathy.  Tiny fluid  collection LEFT axilla 21 x 10 mm question related to prior surgery.  Focal density centrally within the LEFT breast could represent scarring at a site of previous biopsy though mass not excluded, 10 mm diameter image 25.  16 x 13 mm probable cyst RIGHT lobe liver image 84.  Visualized upper abdomen otherwise normal appearance.  Small hiatal hernia.  4 mm RIGHT lower lobe nodule image 74, calcified.  Remaining lungs clear.  No infiltrate, pleural effusion or pneumothorax.  Scattered degenerative disc disease changes inferior cervical  and thoracic spine.  No acute osseous abnormalities.  Review of the MIP images confirms the above findings.  IMPRESSION: No evidence of pulmonary embolism.  No acute intra-abdominal or intrapelvic abnormalities.  Small hiatal hernia.  Probable small RIGHT lobe hepatic cyst.  Small nodular focus centrally in LEFT breast 10 mm diameter question related to previous biopsy or scarring, recommend mammographic follow-up and breast exam to exclude tumor.  Small fluid collection LEFT axilla question related to prior surgery, recommend correlation with patient history.   Electronically Signed   By: Lavonia Dana M.D.   On: 09/19/2014 19:44   Dg Chest Port 1 View  09/19/2014   CLINICAL DATA:  Sudden onset of mid chest pain and shortness of breath.  EXAM: PORTABLE CHEST - 1 VIEW  COMPARISON:  01/29/2009  FINDINGS: Heart size and pulmonary vascularity are normal and the lungs are clear. Old healed left ninth rib fracture.  IMPRESSION: No acute abnormalities.   Electronically Signed   By: Lorriane Shire M.D.   On: 09/19/2014 16:58     Assessment and Plan   1. NSTEMI - Dr. Ellyn Hack had extensive discussion with the patient about elevated troponin and concern for cardiac cause of her symptoms. She is amenable to proceeding with cath this morning. Risks and benefits of cardiac catheterization have been discussed with the patient.  These include bleeding, infection, kidney damage, stroke, heart attack, death.  The patient understands these risks and is willing to proceed. Will need to f/u results to find out the earliest possible time she can be discharged safetly to help her assist her granddaughter with the green card in [place]. She is on heparin, ASA, BB, statin.  2. Hyperlipidemia - LDL 136 - started on high dose atorvastatin pending outcome of cath decision.  3. Hypokalemia - give 43meq now. F/u level in AM if still admitted.  4. Hyperglycemia - glucose 159 on admission - A1C pending.  5. Incidental findings on CT  angio - probable small right lobe hepatic cyst and small nodular focus in left breast possibly related to previous bx of scarring, recommend mammogram to follow-up. Patient informed of findings and asked to discuss with primary care provider.  Signed, Melina Copa PA-C Pager: [phone]   Seen and evaluated the patient this morning along with Ms. Idolina Primer, PA-C. We have reviewed the patient's chart and examined the patient together. We reviewed all available data and the H&P. I agree with her note above.  Patient presented with signs symptoms concerning for acute coronary syndrome with substernal chest pressure and tightness with diaphoresis and nausea associated with dyspnea. She has had a mild troponin elevation but no significant EKG changes. She does have hypertension, dyslipidemia and borderline diabetes with elevated blood sugars today as risk factors.  I agree with the admitting physicians concerns for ACS and then would also prefer to consider early invasive strategy with cardiac catheterization today. I spent at least half an hour talking with the patient about  the risks, benefits and alternatives/indications for catheterization versus nuclear stress test. We also discussed the difficult social situation with her needing to try to be somewhere tomorrow morning. We finally agreed that the best course of action will be to proceed with diagnostic catheterization this morning with the best case scenario being no significant disease and she could be discharged later on this afternoon he did however I'm more concerned that she will potentially need PCI which would leave her being discharged early tomorrow morning. She is making social arrangements to have her granddaughter get to her appointment tomorrow morning in [place].   She does have hypoxemia on admission we will check A1c to terminate extent of her diabetes. She'll be on high-dose statin due to ACS and [place]. She is on Toprol 25 mg along with  ACE inhibitor and amlodipine - blood pressure is relatively stable to mildly elevated. Can likely increase her metoprolol dose for discharge.  The patient is posted for second case today with Dr. Peter Martinique.  The procedure with Risks/Benefits/Alternatives and Indications was reviewed with the patient.  All questions were answered.    Risks / Complications include, but not limited to: Death, MI, CVA/TIA, VF/VT (with defibrillation), Bradycardia (need for temporary pacer placement), contrast induced nephropathy, bleeding / bruising / hematoma / pseudoaneurysm, vascular or coronary injury (with possible emergent CT or Vascular Surgery), adverse medication reactions, infection.  Additional risks involving the use of radiation with the possibility of radiation burns and cancer were explained in detail.  The patient voices understanding and agree to proceed.       Leonie Man, M.D., M.S. Interventional Cardiologist   Pager # [phone]

## 2014-09-20 NOTE — Progress Notes (Addendum)
Patient: Horace Wishon / Admit Date: 09/19/2014 / Date of Encounter: 09/20/2014, 8:47 AM   Subjective: Patient says she does necessarily believe she needs to proceed with cath because she has never had heart problems before (despite being informed that her troponin is abnormal). It is extremely important to her that she accompany her granddaughter to [facility] tomorrow for her [employer]. She was planning on taking the train tonight - does not drive due to vertigo. No further symptoms this morning. She believes her symptoms were due to stress.   Objective: Telemetry: NSR Physical Exam: Blood pressure 126/87, pulse 80, temperature 98.4 F (36.9 C), temperature source Oral, resp. rate 18, height 5\' 6"  (1.676 m), weight 198 lb 3.2 oz (89.903 kg), SpO2 100 %. General: Well developed, well nourished AAF in no acute distress. Head: Normocephalic, atraumatic, sclera non-icteric, no xanthomas, nares are without discharge. Neck: Negative for carotid bruits. JVP not elevated. Lungs: Clear bilaterally to auscultation without wheezes, rales, or rhonchi. Breathing is unlabored. Heart: RRR S1 S2 without murmurs, rubs, or gallops.  Abdomen: Soft, non-tender, non-distended with normoactive bowel sounds. No rebound/guarding. Extremities: No clubbing or cyanosis. No edema. Distal pedal pulses are 2+ and equal bilaterally. Neuro: Alert and oriented X 3. Moves all extremities spontaneously. Psych:  Responds to questions appropriately with a normal affect.   Intake/Output Summary (Last 24 hours) at 09/20/14 0847 Last data filed at 09/20/14 0843  Gross per 24 hour  Intake    222 ml  Output    400 ml  Net   -178 ml    Inpatient Medications:  . amLODipine  10 mg Oral Daily   And  . benazepril  20 mg Oral Daily  . aspirin EC  81 mg Oral Daily  . atorvastatin  80 mg Oral q1800  . levothyroxine  100 mcg Oral QAC breakfast  . metoprolol succinate  25 mg Oral Daily   Infusions:  . heparin 1,000 Units/hr  (09/19/14 2256)    Labs:  Recent Labs  09/19/14 1640 09/20/14 0530  NA 140 141  K 3.3* 3.3*  CL 105 107  CO2 26 26  GLUCOSE 159* 96  BUN 11 10  CREATININE 0.87 0.77  CALCIUM 9.4 9.2    Recent Labs  09/19/14 1640  AST 23  ALT 17  ALKPHOS 51  BILITOT 0.5  PROT 6.9  ALBUMIN 3.5    Recent Labs  09/19/14 1640 09/20/14 0530  WBC 7.9 6.9  NEUTROABS 6.2  --   HGB 12.7 11.7*  HCT 39.6 36.8  MCV 87.0 87.2  PLT 283 263    Recent Labs  09/20/14 0158 09/20/14 0530  TROPONINI 0.13* 0.12*   Invalid input(s): POCBNP No results for input(s): HGBA1C in the last 72 hours.   Radiology/Studies:  Ct Angio Chest Pe W/cm &/or Wo Cm  09/19/2014   CLINICAL DATA:  Chest pain through to back, dyspnea, recent cold with productive cough, history breast cancer in remission for 5 years post chemotherapy and radiation therapy, hypertension  EXAM: CT ANGIOGRAPHY CHEST WITH CONTRAST  TECHNIQUE: Multidetector CT imaging of the chest was performed using the standard protocol during bolus administration of intravenous contrast. Multiplanar CT image reconstructions and MIPs were obtained to evaluate the vascular anatomy.  CONTRAST:  62mL OMNIPAQUE IOHEXOL 350 MG/ML SOLN IV  COMPARISON:  None  FINDINGS: Aorta normal caliber without aneurysm or dissection.  Pulmonary arteries well opacified and patent.  No evidence of pulmonary embolism.  No thoracic adenopathy.  Tiny fluid  collection LEFT axilla 21 x 10 mm question related to prior surgery.  Focal density centrally within the LEFT breast could represent scarring at a site of previous biopsy though mass not excluded, 10 mm diameter image 25.  16 x 13 mm probable cyst RIGHT lobe liver image 84.  Visualized upper abdomen otherwise normal appearance.  Small hiatal hernia.  4 mm RIGHT lower lobe nodule image 74, calcified.  Remaining lungs clear.  No infiltrate, pleural effusion or pneumothorax.  Scattered degenerative disc disease changes inferior cervical  and thoracic spine.  No acute osseous abnormalities.  Review of the MIP images confirms the above findings.  IMPRESSION: No evidence of pulmonary embolism.  No acute intra-abdominal or intrapelvic abnormalities.  Small hiatal hernia.  Probable small RIGHT lobe hepatic cyst.  Small nodular focus centrally in LEFT breast 10 mm diameter question related to previous biopsy or scarring, recommend mammographic follow-up and breast exam to exclude tumor.  Small fluid collection LEFT axilla question related to prior surgery, recommend correlation with patient history.   Electronically Signed   By: Lavonia Dana M.D.   On: 09/19/2014 19:44   Dg Chest Port 1 View  09/19/2014   CLINICAL DATA:  Sudden onset of mid chest pain and shortness of breath.  EXAM: PORTABLE CHEST - 1 VIEW  COMPARISON:  01/29/2009  FINDINGS: Heart size and pulmonary vascularity are normal and the lungs are clear. Old healed left ninth rib fracture.  IMPRESSION: No acute abnormalities.   Electronically Signed   By: Lorriane Shire M.D.   On: 09/19/2014 16:58     Assessment and Plan   1. NSTEMI - Dr. Ellyn Hack had extensive discussion with the patient about elevated troponin and concern for cardiac cause of her symptoms. She is amenable to proceeding with cath this morning. Risks and benefits of cardiac catheterization have been discussed with the patient.  These include bleeding, infection, kidney damage, stroke, heart attack, death.  The patient understands these risks and is willing to proceed. Will need to f/u results to find out the earliest possible time she can be discharged safetly to help her assist her granddaughter with the green card in [place]. She is on heparin, ASA, BB, statin.  2. Hyperlipidemia - LDL 136 - started on high dose atorvastatin pending outcome of cath decision.  3. Hypokalemia - give 22meq now. F/u level in AM if still admitted.  4. Hyperglycemia - glucose 159 on admission - A1C pending.  5. Incidental findings on CT  angio - probable small right lobe hepatic cyst and small nodular focus in left breast possibly related to previous bx of scarring, recommend mammogram to follow-up. Patient informed of findings and asked to discuss with primary care provider.  Signed, Melina Copa PA-C Pager: [phone]   Seen and evaluated the patient this morning along with Ms. Idolina Primer, PA-C. We have reviewed the patient's chart and examined the patient together. We reviewed all available data and the H&P. I agree with her note above.  Patient presented with signs symptoms concerning for acute coronary syndrome with substernal chest pressure and tightness with diaphoresis and nausea associated with dyspnea. She has had a mild troponin elevation but no significant EKG changes. She does have hypertension, dyslipidemia and borderline diabetes with elevated blood sugars today as risk factors.  I agree with the admitting physicians concerns for ACS and then would also prefer to consider early invasive strategy with cardiac catheterization today. I spent at least half an hour talking with the patient about  the risks, benefits and alternatives/indications for catheterization versus nuclear stress test. We also discussed the difficult social situation with her needing to try to be somewhere tomorrow morning. We finally agreed that the best course of action will be to proceed with diagnostic catheterization this morning with the best case scenario being no significant disease and she could be discharged later on this afternoon he did however I'm more concerned that she will potentially need PCI which would leave her being discharged early tomorrow morning. She is making social arrangements to have her granddaughter get to her appointment tomorrow morning in [place].   She does have hypoxemia on admission we will check A1c to terminate extent of her diabetes. She'll be on high-dose statin due to ACS and [place]. She is on Toprol 25 mg along with  ACE inhibitor and amlodipine - blood pressure is relatively stable to mildly elevated. Can likely increase her metoprolol dose for discharge.  The patient is posted for second case today with Dr. Peter Martinique.  The procedure with Risks/Benefits/Alternatives and Indications was reviewed with the patient.  All questions were answered.    Risks / Complications include, but not limited to: Death, MI, CVA/TIA, VF/VT (with defibrillation), Bradycardia (need for temporary pacer placement), contrast induced nephropathy, bleeding / bruising / hematoma / pseudoaneurysm, vascular or coronary injury (with possible emergent CT or Vascular Surgery), adverse medication reactions, infection.  Additional risks involving the use of radiation with the possibility of radiation burns and cancer were explained in detail.  The patient voices understanding and agree to proceed.       Leonie Man, M.D., M.S. Interventional Cardiologist   Pager # [phone]

## 2014-09-20 NOTE — Progress Notes (Signed)
TR BAND REMOVAL  LOCATION:    right radial  DEFLATED PER PROTOCOL:    Yes.    TIME BAND OFF / DRESSING APPLIED:    1830   SITE UPON ARRIVAL:    Level 0  SITE AFTER BAND REMOVAL:    Level 0  REVERSE ALLEN'S TEST:     positive  CIRCULATION SENSATION AND MOVEMENT:    Within Normal Limits   Yes.    COMMENTS:   Tolerated procedure well

## 2014-09-20 NOTE — Interval H&P Note (Signed)
History and Physical Interval Note:  09/20/2014 11:36 AM  Brooke Proctor  has presented today for surgery, with the diagnosis of non stemi  The various methods of treatment have been discussed with the patient and family. After consideration of risks, benefits and other options for treatment, the patient has consented to  Procedure(s): Left Heart Cath and Coronary Angiography (N/A) as a surgical intervention .  The patient's history has been reviewed, patient examined, no change in status, stable for surgery.  I have reviewed the patient's chart and labs.  Questions were answered to the patient's satisfaction.   Cath Lab Visit (complete for each Cath Lab visit)  Clinical Evaluation Leading to the Procedure:   ACS: Yes.    Non-ACS:    Anginal Classification: CCS IV  Anti-ischemic medical therapy: No Therapy  Non-Invasive Test Results: No non-invasive testing performed  Prior CABG: No previous CABG        [name] [facility] 09/20/2014 11:36 AM

## 2014-09-20 NOTE — Progress Notes (Signed)
ANTICOAGULATION CONSULT NOTE - Follow up  Pharmacy Consult for Heparin Indication: chest pain/ACS  Allergies  Allergen Reactions  . Adhesive [Tape]     Itching   . Latex     Itching     Patient Measurements: Height: 5\' 6"  (167.6 cm) Weight: 198 lb 3.2 oz (89.903 kg) IBW/kg (Calculated) : 59.3 Heparin Dosing Weight: 79.1 kg  Vital Signs: Temp: 98.4 F (36.9 C) (05/18 0440) Temp Source: Oral (05/18 0440) BP: 126/87 mmHg (05/18 0440) Pulse Rate: 80 (05/18 0440)  Labs:  Recent Labs  09/19/14 1640 09/20/14 0158 09/20/14 0530  HGB 12.7  --  11.7*  HCT 39.6  --  36.8  PLT 283  --  263  LABPROT  --  14.3  --   INR  --  1.10  --   HEPARINUNFRC  --   --  0.54  CREATININE 0.87  --   --   TROPONINI  --  0.13*  --     Estimated Creatinine Clearance: 74.7 mL/min (by C-G formula based on Cr of 0.87).   Medical History: Past Medical History  Diagnosis Date  . Hypertension   . Thyroid disease   . Cancer   . Status post chemotherapy   . History of radiation therapy      Assessment: Initial 6 hour heparin level = 0.54 on heparin drip 1000 units/hr for chest pain in this 64 yo female. Therapeutic heparin level.  Hgb decreased slightly to 11.7 and PLTC remains wnl. No reported s/s bleeding.   Goal of Therapy:  Heparin level 0.3-0.7 units/ml Monitor platelets by anticoagulation protocol: Yes   Plan:  - Continue heparin 1000 units/hr - 6 hour HL to confirm remains therapeutic - Daily HL/CBC - Monitor for s/s bleeding - F/u plans for cath   Nicole Cella, [place] Pharmacist Pager: [place]: [phone] 09/20/2014 6:26 AM

## 2014-09-20 NOTE — Progress Notes (Signed)
Utilization review completed. Marishka Rentfrow, RN, BSN. 

## 2014-09-20 NOTE — Telephone Encounter (Signed)
New message       TCM appt on 09-27-14 per dayna

## 2014-09-20 NOTE — Progress Notes (Signed)
Patient in room 6C05. Patients clothes and shoes taken to new room.   Dorthey Sawyer, RN

## 2014-09-21 ENCOUNTER — Inpatient Hospital Stay (HOSPITAL_COMMUNITY): Payer: 59

## 2014-09-21 ENCOUNTER — Other Ambulatory Visit: Payer: Self-pay | Admitting: Cardiology

## 2014-09-21 ENCOUNTER — Encounter (HOSPITAL_COMMUNITY): Payer: Self-pay | Admitting: Cardiology

## 2014-09-21 DIAGNOSIS — I10 Essential (primary) hypertension: Secondary | ICD-10-CM | POA: Diagnosis present

## 2014-09-21 DIAGNOSIS — I213 ST elevation (STEMI) myocardial infarction of unspecified site: Secondary | ICD-10-CM

## 2014-09-21 DIAGNOSIS — Z9861 Coronary angioplasty status: Secondary | ICD-10-CM

## 2014-09-21 DIAGNOSIS — E039 Hypothyroidism, unspecified: Secondary | ICD-10-CM | POA: Diagnosis present

## 2014-09-21 HISTORY — PX: TRANSTHORACIC ECHOCARDIOGRAM: SHX275

## 2014-09-21 LAB — BASIC METABOLIC PANEL
ANION GAP: 8 (ref 5–15)
BUN: 10 mg/dL (ref 6–20)
CO2: 23 mmol/L (ref 22–32)
Calcium: 8.6 mg/dL — ABNORMAL LOW (ref 8.9–10.3)
Chloride: 108 mmol/L (ref 101–111)
Creatinine, Ser: 0.78 mg/dL (ref 0.44–1.00)
Glucose, Bld: 87 mg/dL (ref 65–99)
Potassium: 3.7 mmol/L (ref 3.5–5.1)
SODIUM: 139 mmol/L (ref 135–145)

## 2014-09-21 LAB — CBC
HCT: 37 % (ref 36.0–46.0)
Hemoglobin: 11.7 g/dL — ABNORMAL LOW (ref 12.0–15.0)
MCH: 27.6 pg (ref 26.0–34.0)
MCHC: 31.6 g/dL (ref 30.0–36.0)
MCV: 87.3 fL (ref 78.0–100.0)
Platelets: 260 10*3/uL (ref 150–400)
RBC: 4.24 MIL/uL (ref 3.87–5.11)
RDW: 13.7 % (ref 11.5–15.5)
WBC: 6.4 10*3/uL (ref 4.0–10.5)

## 2014-09-21 LAB — HEMOGLOBIN A1C
Hgb A1c MFr Bld: 5.9 % — ABNORMAL HIGH (ref 4.8–5.6)
MEAN PLASMA GLUCOSE: 123 mg/dL

## 2014-09-21 MED ORDER — TICAGRELOR 90 MG PO TABS: 90.0000 mg | ORAL_TABLET | Freq: Two times a day (BID) | ORAL | Status: DC

## 2014-09-21 MED ORDER — METOPROLOL SUCCINATE ER 25 MG PO TB24: 25.0000 mg | ORAL_TABLET | Freq: Every day | ORAL | Status: DC

## 2014-09-21 MED ORDER — ACETAMINOPHEN 325 MG PO TABS: 650.0000 mg | ORAL_TABLET | ORAL | Status: DC | PRN

## 2014-09-21 MED ORDER — ATORVASTATIN CALCIUM 80 MG PO TABS: 80.0000 mg | ORAL_TABLET | Freq: Every day | ORAL | Status: DC

## 2014-09-21 MED ORDER — NITROGLYCERIN 0.4 MG SL SUBL: 0.4000 mg | SUBLINGUAL_TABLET | SUBLINGUAL | Status: DC | PRN

## 2014-09-21 MED ORDER — ASPIRIN 81 MG PO TBEC: 81.0000 mg | DELAYED_RELEASE_TABLET | Freq: Every day | ORAL | Status: DC

## 2014-09-21 MED FILL — Lidocaine HCl Local Preservative Free (PF) Inj 1%: INTRAMUSCULAR | Qty: 30 | Status: AC

## 2014-09-21 MED FILL — Heparin Sodium (Porcine) 2 Unit/ML in Sodium Chloride 0.9%: INTRAMUSCULAR | Qty: 1500 | Status: AC

## 2014-09-21 NOTE — Discharge Instructions (Signed)
Take a baby aspirin daily.  Get rechecked immediately if you develop recurrent chest pain.  Please follow up with your family doctor regarding your CT scan.     Chest Pain (Nonspecific) It is often hard to give a specific diagnosis for the cause of chest pain. There is always a chance that your pain could be related to something serious, such as a heart attack or a blood clot in the lungs. You need to follow up with your health care provider for further evaluation. CAUSES   Heartburn.  Pneumonia or bronchitis.  Anxiety or stress.  Inflammation around your heart (pericarditis) or lung (pleuritis or pleurisy).  A blood clot in the lung.  A collapsed lung (pneumothorax). It can develop suddenly on its own (spontaneous pneumothorax) or from trauma to the chest.  Shingles infection (herpes zoster virus). The chest wall is composed of bones, muscles, and cartilage. Any of these can be the source of the pain.  The bones can be bruised by injury.  The muscles or cartilage can be strained by coughing or overwork.  The cartilage can be affected by inflammation and become sore (costochondritis). DIAGNOSIS  Lab tests or other studies may be needed to find the cause of your pain. Your health care provider may have you take a test called an ambulatory electrocardiogram (ECG). An ECG records your heartbeat patterns over a 24-hour period. You may also have other tests, such as:  Transthoracic echocardiogram (TTE). During echocardiography, sound waves are used to evaluate how blood flows through your heart.  Transesophageal echocardiogram (TEE).  Cardiac monitoring. This allows your health care provider to monitor your heart rate and rhythm in real time.  Holter monitor. This is a portable device that records your heartbeat and can help diagnose heart arrhythmias. It allows your health care provider to track your heart activity for several days, if needed.  Stress tests by exercise or by giving  medicine that makes the heart beat faster. TREATMENT   Treatment depends on what may be causing your chest pain. Treatment may include:  Acid blockers for heartburn.  Anti-inflammatory medicine.  Pain medicine for inflammatory conditions.  Antibiotics if an infection is present.  You may be advised to change lifestyle habits. This includes stopping smoking and avoiding alcohol, caffeine, and chocolate.  You may be advised to keep your head raised (elevated) when sleeping. This reduces the chance of acid going backward from your stomach into your esophagus. Most of the time, nonspecific chest pain will improve within 2-3 days with rest and mild pain medicine.  HOME CARE INSTRUCTIONS   If antibiotics were prescribed, take them as directed. Finish them even if you start to feel better.  For the next few days, avoid physical activities that bring on chest pain. Continue physical activities as directed.  Do not use any tobacco products, including cigarettes, chewing tobacco, or electronic cigarettes.  Avoid drinking alcohol.  Only take medicine as directed by your health care provider.  Follow your health care provider's suggestions for further testing if your chest pain does not go away.  Keep any follow-up appointments you made. If you do not go to an appointment, you could develop lasting (chronic) problems with pain. If there is any problem keeping an appointment, call to reschedule. SEEK MEDICAL CARE IF:   Your chest pain does not go away, even after treatment.  You have a rash with blisters on your chest.  You have a fever. SEEK IMMEDIATE MEDICAL CARE IF:   You  have increased chest pain or pain that spreads to your arm, neck, jaw, back, or abdomen.  You have shortness of breath.  You have an increasing cough, or you cough up blood.  You have severe back or abdominal pain.  You feel nauseous or vomit.  You have severe weakness.  You faint.  You have  chills. This is an emergency. Do not wait to see if the pain will go away. Get medical help at once. Call your local emergency services (911 in [place] Do not drive yourself to the hospital. MAKE SURE YOU:   Understand these instructions.  Will watch your condition.  Will get help right away if you are not doing well or get worse. Document Released: 01/29/2005 Document Revised: 04/26/2013 Document Reviewed: 11/25/2007 [facility] Patient Information 2015 [place], [place]. This information is not intended to replace advice given to you by your health care provider. Make sure you discuss any questions you have with your health care provider. Radial Site Care Refer to this sheet in the next few weeks. These instructions provide you with information on caring for yourself after your procedure. Your caregiver may also give you more specific instructions. Your treatment has been planned according to current medical practices, but problems sometimes occur. Call your caregiver if you have any problems or questions after your procedure. HOME CARE INSTRUCTIONS You may shower the day after the procedure.Remove the bandage (dressing) and gently wash the site with plain soap and water.Gently pat the site dry. Do not apply powder or lotion to the site. Do not submerge the affected site in water for 3 to 5 days. Inspect the site at least twice daily. Do not flex or bend the affected arm for 24 hours. No lifting over 5 pounds (2.3 kg) for 5 days after your procedure. Do not drive home if you are discharged the same day of the procedure. Have someone else drive you. You may drive 24 hours after the procedure unless otherwise instructed by your caregiver. Do not operate machinery or power tools for 24 hours. A responsible adult should be with you for the first 24 hours after you arrive home. What to expect: Any bruising will usually fade within 1 to 2 weeks. Blood that collects in the tissue (hematoma) may be  painful to the touch. It should usually decrease in size and tenderness within 1 to 2 weeks. SEEK IMMEDIATE MEDICAL CARE IF: You have unusual pain at the radial site. You have redness, warmth, swelling, or pain at the radial site. You have drainage (other than a small amount of blood on the dressing). You have chills. You have a fever or persistent symptoms for more than 72 hours. You have a fever and your symptoms suddenly get worse. Your arm becomes pale, cool, tingly, or numb. You have heavy bleeding from the site. Hold pressure on the site. Document Released: 05/24/2010 Document Revised: 07/14/2011 Document Reviewed: 05/24/2010 [facility] Patient Information 2015 [place], [place]. This information is not intended to replace advice given to you by your health care provider. Make sure you discuss any questions you have with your health care provider. Coronary Angiogram With Stent, Care After Refer to this sheet in the next few weeks. These instructions provide you with information on caring for yourself after your procedure. Your health care provider may also give you more specific instructions. Your treatment has been planned according to current medical practices, but problems sometimes occur. Call your health care provider if you have any problems or questions after  your procedure.  WHAT TO EXPECT AFTER THE PROCEDURE  The insertion site may be tender for a few days after your procedure. HOME CARE INSTRUCTIONS  Take medicines only as directed by your health care provider. Blood thinners may be prescribed after your procedure to improve blood flow through the stent. Change any bandages (dressings) as directed by your health care provider.  Check your insertion site every day for redness, swelling, or fluid leaking from the insertion.  Do not take baths, swim, or use a hot tub until your health care provider approves. You may shower. Pat the insertion area dry. Do not rub the insertion area with  a washcloth or towel.  Eat a heart-healthy diet. This should include plenty of fresh fruits and vegetables. Meat should be lean cuts. Avoid the following types of food:  Food that is high in salt.  Canned or highly processed food.  Food that is high in saturated fat or sugar.  Fried food.  Make any other lifestyle changes recommended by your health care provider. This may include:  Not using any tobacco products including cigarettes, chewing tobacco, or electronic cigarettes. Managing your weight.  Getting regular exercise.  Managing your blood pressure.  Limiting your alcohol intake.  Managing other health problems, such as diabetes.  If you need an MRI after your heart stent was placed, be sure to tell the health care provider who orders the MRI that you have a heart stent.  Keep all follow-up visits as directed by your health care provider.  SEEK IMMEDIATE MEDICAL CARE IF:  You develop chest pain, shortness of breath, feel faint, or pass out. You have bleeding, swelling larger than a walnut, or drainage from the catheter insertion site. You develop pain, discoloration, coldness, or severe bruising in the leg or arm that held the catheter. You develop bleeding from any other place such as from the bowels. There may be bright red blood in the urine or stools, or it may appear as black, tarry stools. You have a fever or chills. MAKE SURE YOU: Understand these instructions. Will watch your condition. Will get help right away if you are not doing well or get worse. Document Released: 11/08/2004 Document Revised: 09/05/2013 Document Reviewed: 09/22/2012 [facility] Patient Information 2015 [place], [place]. This information is not intended to replace advice given to you by your health care provider. Make sure you discuss any questions you have with your health care provider.  STOP TAKING:  BENAZEPRIL, HYDROCHLOROTHIAZIDE, TESSALON

## 2014-09-21 NOTE — Discharge Summary (Signed)
Patient ID: Brooke Proctor,  MRN: [id number], DOB/AGE: 64-Aug-1952 64 y.o.  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 Shortly afterward developed epigastric/substernal chest pressure, moderate intensity. This radiated to her back. It was associated with dyspnea. Her coworkers noted that she didn't feel well. They made the decision that she needed to seek medical attention. EMS arrived and gave her ASA with resolution of CP shortly after without receiving NTG. ED work up revealed ECG without acute findings, initial troponin negative but repeat 0.15 prompting cardiology consult for admission. Pt was seen by Dr Ellyn Hack and taken to the cath lab. At cath she had significant two vessel CAD and underwent LAD and RCA DES placement. We feel she is stable for discharge 09/21/14. A CT was done on admission in the ED. This showed a small nodular focus centrally in LEFT breast 10 mm diameter, possibly related to previous biopsy or scarring, recommend mammographic follow-up and breast exam to exclude tumor. She was instructed to f/u with her oncologist about this.     Discharge Vitals:  Blood pressure 147/92, pulse 77, temperature 98.1 F (36.7 C), temperature source Oral, resp. rate 12, height 5\' 6"  (1.676 m), weight 197 lb 1.5 oz (89.4 kg), SpO2 97 %.     Labs: Results for orders placed or performed during the hospital encounter of 09/19/14 (from the past 24 hour(s))  POCT Activated clotting time     Status: None   Collection Time: 09/20/14 12:15 PM  Result Value Ref Range   Activated Clotting Time 368 seconds  Troponin I     Status: Abnormal   Collection Time: 09/20/14  6:09 PM  Result Value Ref Range   Troponin I 0.12 (H) <0.031 ng/mL    Disposition:  Follow-up Information    Follow up with Melina Copa, PA-C.   Specialties:  Cardiology, Radiology   Why:  [facility] - 09/27/14 at 8:30am - Your first post-hospital visit will be in our [facility] transition-of-care clinic. (Your subsequent visits will be with Dr. Ellyn Hack in the [facility] office.)   Contact information:   [address] [place] [address] [phone]       Follow up with SNOW, Mali A, PA-C.   Specialty:  Physician Assistant   Why:  To discuss incidental findings on CT scan - probable small right lobe hepatic cyst and small nodular focus in left breast possibly related to previous biopsy or scarring, but needs to be followed up with mammogram and breast exam.   Contact information:   [place] [address] [phone]       Discharge Medications:    Medication List    TAKE these medications        acetaminophen 325 MG tablet  Commonly known as:  TYLENOL  Take 2 tablets (650 mg total) by  mouth every 4 (four) hours as needed for headache or mild pain.     amLODipine-benazepril 10-20 MG per capsule  Commonly known as:  LOTREL  Take 1 capsule by mouth daily.     aspirin 81 MG EC tablet  Take 1 tablet (81 mg total) by mouth daily.     atorvastatin 80 MG tablet  Commonly known as:  LIPITOR  Take 1 tablet (80 mg total) by mouth daily at 6 PM.     Biotin 1000 MCG tablet  Take 1,000 mcg by mouth daily.     ergocalciferol 50000 UNITS capsule  Commonly known as:  VITAMIN D2  Take 50,000 Units by mouth  once a week. Take on sundays     Fish Oil 1000 MG Caps  Take 1,000 mg by mouth daily.     guaiFENesin 200 MG tablet  Take 200 mg by mouth every 4 (four) hours as needed for cough or to loosen phlegm.     levothyroxine 100 MCG tablet  Commonly known as:  SYNTHROID, LEVOTHROID  Take 100 mcg by mouth daily before breakfast.     metoprolol succinate 25 MG 24 hr tablet  Commonly known as:  TOPROL-XL  Take 1 tablet (25 mg total) by mouth daily.     nitroGLYCERIN 0.4 MG SL tablet  Commonly known as:  NITROSTAT  Place 1 tablet (0.4 mg total) under the tongue every 5 (five) minutes x 3 doses as needed for chest pain.     ticagrelor 90 MG Tabs tablet  Commonly known as:  BRILINTA  Take 1 tablet (90 mg total) by mouth 2 (two) times daily.         Duration of Discharge Encounter: Greater than 30 minutes including physician time.  Angelena Form PA-C 09/21/2014 7:24 AM

## 2014-09-21 NOTE — Progress Notes (Signed)
  Echocardiogram 2D Echocardiogram has been performed.  Brooke Proctor 09/21/2014, 11:16 AM

## 2014-09-21 NOTE — Telephone Encounter (Signed)
Pt noted to still be in hospital at this time. Will leave on triage desktop for follow up on 09/22/14.

## 2014-09-21 NOTE — Progress Notes (Signed)
CM spoke  with  Pt  regarding Brilinta ,  pamphlet @  bedside with 30 day free card enclosed. [facility], [phone], called by CM to confirm medication in stock. Pt made aware per CM. No other needs identified by CM @ present time.

## 2014-09-21 NOTE — Progress Notes (Signed)
0800-0905 BP 154/94 NSR 82. Pt stated has walked already this morning without CP so did not walk pt. MI education completed with pt who voiced understanding. Discussed importance of stent and brilinta. Pt has booklet. Discussed CRP 2 and pt gave permission to refer to [facility] program. Discussed risk factor modification and pt receptive to trying to improve diet and watch carbs more. Graylon Good RN BSN 09/21/2014 9:05 AM

## 2014-09-21 NOTE — Progress Notes (Signed)
Subjective:  No complaints overnight, up this am   Objective:  Vital Signs in the last 24 hours: Temp:  [97.8 F (36.6 C)-98.7 F (37.1 C)] 98.1 F (36.7 C) (05/19 0554) Pulse Rate:  [27-83] 77 (05/19 0554) Resp:  [0-114] 12 (05/19 0554) BP: (121-181)/(49-115) 147/92 mmHg (05/19 0605) SpO2:  [0 %-100 %] 97 % (05/19 0554) Weight:  [197 lb 1.5 oz (89.4 kg)] 197 lb 1.5 oz (89.4 kg) (05/19 0037)  Intake/Output from previous day:  Intake/Output Summary (Last 24 hours) at 09/21/14 0723 Last data filed at 09/20/14 1823  Gross per 24 hour  Intake  540.5 ml  Output    400 ml  Net  140.5 ml    Physical Exam: General appearance: alert, cooperative and no distress Lungs: clear to auscultation bilaterally Heart: regular rate and rhythm Extremities: Rt radial site without hematoma   Rate: 75  Rhythm: normal sinus rhythm  Lab Results:  Recent Labs  09/19/14 1640 09/20/14 0530  WBC 7.9 6.9  HGB 12.7 11.7*  PLT 283 263    Recent Labs  09/19/14 1640 09/20/14 0530  NA 140 141  K 3.3* 3.3*  CL 105 107  CO2 26 26  GLUCOSE 159* 96  BUN 11 10  CREATININE 0.87 0.77    Recent Labs  09/20/14 0530 09/20/14 1809  TROPONINI 0.12* 0.12*    Recent Labs  09/20/14 0158  INR 1.10    Scheduled Meds: . amLODipine  10 mg Oral Daily   And  . benazepril  20 mg Oral Daily  . aspirin  81 mg Oral Daily  . aspirin EC  81 mg Oral Daily  . atorvastatin  80 mg Oral q1800  . levothyroxine  100 mcg Oral QHS  . metoprolol succinate  25 mg Oral Daily  . sodium chloride  3 mL Intravenous Q12H  . ticagrelor  90 mg Oral BID   Continuous Infusions:  PRN Meds:.sodium chloride, acetaminophen, morphine injection, nitroGLYCERIN, ondansetron (ZOFRAN) IV, sodium chloride   Imaging: Ct Angio Chest Pe W/cm &/or Wo Cm  09/19/2014   CLINICAL DATA:  Chest pain through to back, dyspnea, recent cold with productive cough, history breast cancer in remission for 5 years post chemotherapy  and radiation therapy, hypertension     IMPRESSION: No evidence of pulmonary embolism.  No acute intra-abdominal or intrapelvic abnormalities.  Small hiatal hernia.  Probable small RIGHT lobe hepatic cyst.  Small nodular focus centrally in LEFT breast 10 mm diameter question related to previous biopsy or scarring, recommend mammographic follow-up and breast exam to exclude tumor.  Small fluid collection LEFT axilla question related to prior surgery, recommend correlation with patient history.   Electronically Signed   By: Lavonia Dana M.D.   On: 09/19/2014 19:44   Dg Chest Port 1 View  09/19/2014   CLINICAL DATA:  Sudden onset of mid chest pain and shortness of breath.  EXAM: PORTABLE CHEST - 1 VIEW  COMPARISON:  01/29/2009  FINDINGS: Heart size and pulmonary vascularity are normal and the lungs are clear. Old healed left ninth rib fracture.  IMPRESSION: No acute abnormalities.   Electronically Signed   By: Lorriane Shire M.D.   On: 09/19/2014 16:58    Cardiac Studies: Echo pending  Assessment/Plan:   Principal Problem:   NSTEMI (non-ST elevated myocardial infarction) Active Problems:   CAD S/P DES PCI to mLAD & m-dRCA   Essential hypertension   Breast cancer of upper-outer quadrant of left female breast with abnormal  CT findings   Hypothyroidism   PLAN: Discharge, f/u with her oncologist, f/u echo results as an OP, f/u with APP in 1-2 weeks, then Dr Tamala Julian (pt's request) in 3 months.   [employer] PA-C 09/21/2014, 7:23 AM [phone]

## 2014-09-22 NOTE — Telephone Encounter (Signed)
Patient contacted regarding discharge from [facility] on 5/19.   Patient understands to follow up with provider ? 5/25 at 8:30am with D. Dunn; pt advised to arrive at 8:15am  Patient understands discharge instructions? Yes  Patient understands medications and regiment? Yes, pt verbalized understanding  Patient understands to bring all medications to this visit? Yes

## 2014-09-26 ENCOUNTER — Encounter: Payer: Self-pay | Admitting: Physician Assistant

## 2014-09-26 DIAGNOSIS — E785 Hyperlipidemia, unspecified: Secondary | ICD-10-CM | POA: Insufficient documentation

## 2014-09-26 DIAGNOSIS — I25119 Atherosclerotic heart disease of native coronary artery with unspecified angina pectoris: Secondary | ICD-10-CM | POA: Insufficient documentation

## 2014-09-26 DIAGNOSIS — I38 Endocarditis, valve unspecified: Secondary | ICD-10-CM | POA: Insufficient documentation

## 2014-09-26 DIAGNOSIS — I2510 Atherosclerotic heart disease of native coronary artery without angina pectoris: Secondary | ICD-10-CM | POA: Insufficient documentation

## 2014-09-26 DIAGNOSIS — E66811 Obesity, class 1: Secondary | ICD-10-CM | POA: Insufficient documentation

## 2014-09-26 DIAGNOSIS — E669 Obesity, unspecified: Secondary | ICD-10-CM | POA: Insufficient documentation

## 2014-09-26 NOTE — Progress Notes (Signed)
Cardiology Office Note Date:  09/27/2014  Patient ID:  Brooke Proctor, Brooke Proctor March 06, 1951, MRN [id number] PCP:  SNOW, Mali A, PA-C  Cardiologist:  Dr. Tamala Julian (patient's request)  Chief Complaint: follow-up of NSTEMI  History of Present Illness: Brooke Proctor is a 64 y.o. female with history of HTN, hyperlipidemia, hyperglycemia (A1C 5.9 in 09/2014), prior breast cancer, and recently diagnosed CAD/NSTEMI who presents for post-hospital followup. She was admitted 5/17-5/19 with chest pain and mildly elevated troponin. She was initially anxious to leave because her granddaughter had an appointment in [place] to get her green card the next day. She was agreeable to staying for cath and ultimately underwent DES placement to the [facility]. 2D echo 09/21/14: EF 60-65%, mild LVH, grade 1 DD, mild AI/MR. CTA had initially been done during admission due to positive d-dimer revealing small nodular focus centrally in LEFT breast 10 mm diameter question related to previous biopsy or scarring, along with a right lobe hepatic cyst - she was informed of these and asked to follow up PCP/oncology. She was discharged on standard post-MI therapy including high dose statin as LDL was high at 136.  She comes in today doing well. She does notice the occasional blip palpitation followed by the urge to cough or sigh. EKG shows a PAC which correlated with her symptoms. She denies any residual chest pain or SOB. During her hospitalization her K was noted to be low. Although hospital med rec indicated she was on combo amlodipine/benazepril, she reports the amlodipine component was previously discontinued due to edema and she has been on HCTZ. This likely accounts for her hypokalemia. She resumed this regimen at home post-discharge. She had no trouble obtaining her medication otherwise. She has been gradually increasing her activity and plans to get back into water aerobics (was awaiting clearance from radial cath site today).  Her granddaughter successfully got her green card and everything went well.   Past Medical History  Diagnosis Date  . Thyroid disease   . Breast cancer   . Status post chemotherapy   . History of radiation therapy   . Essential hypertension   . NSTEMI (non-ST elevated myocardial infarction) 09/19/2014  . CAD (coronary artery disease)     a. NSTEMI 09/2014: s/p DES to mLAD and mRCA. Normal EF.  Marland Kitchen Hyperlipidemia   . Mild valvular heart disease     a. Echo 09/2014: mild AI/MR.  . Obesity     Past Surgical History  Procedure Laterality Date  . Lymph node dissection    . Cardiac catheterization N/A 09/20/2014    Procedure: Left Heart Cath and Coronary Angiography;  Surgeon: Peter M Martinique, MD;  Location: [facility];  Service: Cardiovascular;  Laterality: N/A;  . Cardiac catheterization  09/20/2014    Procedure: Coronary Stent Intervention;  Surgeon: Peter M Martinique, MD;  Location: [facility];  Service: Cardiovascular;;    Current Outpatient Prescriptions  Medication Sig Dispense Refill  . acetaminophen (TYLENOL) 325 MG tablet Take 2 tablets (650 mg total) by mouth every 4 (four) hours as needed for headache or mild pain.    Marland Kitchen aspirin EC 81 MG EC tablet Take 1 tablet (81 mg total) by mouth daily.    Marland Kitchen atorvastatin (LIPITOR) 80 MG tablet Take 1 tablet (80 mg total) by mouth daily at 6 PM. 90 tablet 3  . benazepril (LOTENSIN) 20 MG tablet Take 20 mg by mouth daily.    . benzonatate (TESSALON) 100 MG capsule  Take 100 mg by mouth every 8 (eight) hours as needed for cough.    . Biotin 1000 MCG tablet Take 1,000 mcg by mouth daily.    . ergocalciferol (VITAMIN D2) 50000 UNITS capsule Take 50,000 Units by mouth once a week. Take on sundays    . fluticasone (FLONASE) 50 MCG/ACT nasal spray Place 2 sprays into both nostrils daily.    Marland Kitchen guaiFENesin 200 MG tablet Take 200 mg by mouth every 4 (four) hours as needed for cough or to loosen phlegm.    . hydrochlorothiazide (HYDRODIURIL)  25 MG tablet Take 25 mg by mouth daily.    Marland Kitchen levothyroxine (SYNTHROID, LEVOTHROID) 100 MCG tablet Take 100 mcg by mouth daily before breakfast.    . metoprolol succinate (TOPROL-XL) 25 MG 24 hr tablet Take 1 tablet (25 mg total) by mouth daily. 90 tablet 3  . nitroGLYCERIN (NITROSTAT) 0.4 MG SL tablet Place 1 tablet (0.4 mg total) under the tongue every 5 (five) minutes x 3 doses as needed for chest pain. 25 tablet 2  . Omega-3 Fatty Acids (FISH OIL) 1000 MG CAPS Take 1,000 mg by mouth daily.    Marland Kitchen PROAIR RESPICLICK 094 (90 BASE) MCG/ACT AEPB Inhale 1 puff into the lungs every 4 (four) hours as needed. (SHORTNESS OF BREATH)    . ticagrelor (BRILINTA) 90 MG TABS tablet Take 1 tablet (90 mg total) by mouth 2 (two) times daily. 60 tablet 11   No current facility-administered medications for this visit.    Allergies:   Eggs or egg-derived products; Nickel; Adhesive; and Latex   Social History:  The patient  reports that she has never smoked. She has never used smokeless tobacco. She reports that she does not drink alcohol or use illicit drugs.   Family History:  The patient's family history includes Hypertension in her father and mother; Kidney disease in her father; Stroke in her mother. There is no history of CAD.  ROS:  Please see the history of present illness.   All other systems are reviewed and otherwise negative.   PHYSICAL EXAM:  VS:  BP 118/80 mmHg  Pulse 68  Ht 5\' 6"  (1.676 m)  Wt 189 lb (85.73 kg)  BMI 30.52 kg/m2 BMI: Body mass index is 30.52 kg/(m^2). Well nourished, well developed, in no acute distress HEENT: normocephalic, atraumatic Neck: no JVD, carotid bruits or masses Cardiac:  normal S1, S2; RRR; no murmurs, rubs, or gallops Lungs:  clear to auscultation bilaterally, no wheezing, rhonchi or rales Abd: soft, nontender, no hepatomegaly, + BS MS: no deformity or atrophy Ext: no edema; right radial cath site without hematoma/ecchymosis; good pulse Skin: warm and dry, no  rash Neuro:  moves all extremities spontaneously, no focal abnormalities noted, follows commands Psych: euthymic mood, full affect   EKG:  Done today shows NSR with sinus arrhythmia, one PAC, incomplete RBBB, LAFB, nonspecific T wave changes  Recent Labs: 09/19/2014: ALT 17 09/20/2014: B Natriuretic Peptide 43.6; TSH 0.745 09/21/2014: BUN 10; Creatinine 0.78; Hemoglobin 11.7*; Platelets 260; Potassium 3.7; Sodium 139  09/20/2014: Cholesterol, Total 188; HDL-C 36*; LDL (calc) 136*; Total CHOL/HDL Ratio 5.2; Triglycerides 80; VLDL 16   Estimated Creatinine Clearance: 79.4 mL/min (by C-G formula based on Cr of 0.78).   Wt Readings from Last 3 Encounters:  09/27/14 189 lb (85.73 kg)  09/21/14 197 lb 1.5 oz (89.4 kg)  04/19/13 207 lb 8 oz (94.121 kg)     Other studies reviewed: Additional studies/records reviewed today include: summarized above  ASSESSMENT AND PLAN:  1. CAD with recent NSTEMI s/p DES to LAD, RCA - doing well. Continue DAPT - ultimate duration of Brilinta per Dr. Tamala Julian, but she will need this at least one year. Continue BB and statin. She is interested in cardiac rehab - will place referral. Nursing will help find out if this is feasible in [place] closer to her home. I will be filling out paperwork today for her to return to work without restrictions on 10/03/14. 2. Essential hypertension - we fixed her med rec to reflect that she is on HCTZ. Her low K in the hospital now makes sense. Will recheck today and likely add KCl based on result. If she does have hypokalemia this may be contributing to occasional PACs. 3. Hyperlipidemia - recheck lipids/LFTs in 6-8 weeks. 4. Mild valvular heart disease with mild AI/MR - follow clinically. 5. Obesity Body mass index is 30.52 kg/(m^2). - I congratulated her on her decision to get back into water aerobics. Since A1C is 5.9 (hyperglycemia) she will likely benefit from continued healthy weight loss. 6. Mild anemia - Hgb 11.7 in the  hospital. Recheck today. She reports remote h/o microscopic hematuria but denies any gross bleeding. She will observe for recurrence.  Disposition: F/u with Dr. Tamala Julian in 6-8 weeks.  Current medicines are reviewed at length with the patient today.  The patient did not have any concerns regarding medicines.  Raechel Ache PA-C 09/27/2014 9:13 AM     [facility] [place] [address] [phone] (office)  [phone] (fax)

## 2014-09-27 ENCOUNTER — Ambulatory Visit (INDEPENDENT_AMBULATORY_CARE_PROVIDER_SITE_OTHER): Payer: 59 | Admitting: Physician Assistant

## 2014-09-27 ENCOUNTER — Encounter: Payer: Self-pay | Admitting: Physician Assistant

## 2014-09-27 VITALS — BP 118/80 | HR 68 | Ht 66.0 in | Wt 189.0 lb

## 2014-09-27 DIAGNOSIS — R739 Hyperglycemia, unspecified: Secondary | ICD-10-CM

## 2014-09-27 DIAGNOSIS — E785 Hyperlipidemia, unspecified: Secondary | ICD-10-CM

## 2014-09-27 DIAGNOSIS — D649 Anemia, unspecified: Secondary | ICD-10-CM

## 2014-09-27 DIAGNOSIS — Z9861 Coronary angioplasty status: Secondary | ICD-10-CM

## 2014-09-27 DIAGNOSIS — I38 Endocarditis, valve unspecified: Secondary | ICD-10-CM

## 2014-09-27 DIAGNOSIS — I2510 Atherosclerotic heart disease of native coronary artery without angina pectoris: Secondary | ICD-10-CM

## 2014-09-27 DIAGNOSIS — I10 Essential (primary) hypertension: Secondary | ICD-10-CM

## 2014-09-27 DIAGNOSIS — E669 Obesity, unspecified: Secondary | ICD-10-CM

## 2014-09-27 DIAGNOSIS — I214 Non-ST elevation (NSTEMI) myocardial infarction: Secondary | ICD-10-CM

## 2014-09-27 DIAGNOSIS — I491 Atrial premature depolarization: Secondary | ICD-10-CM

## 2014-09-27 DIAGNOSIS — I222 Subsequent non-ST elevation (NSTEMI) myocardial infarction: Secondary | ICD-10-CM | POA: Diagnosis not present

## 2014-09-27 LAB — BASIC METABOLIC PANEL
BUN: 17 mg/dL (ref 6–23)
CO2: 28 meq/L (ref 19–32)
CREATININE: 0.87 mg/dL (ref 0.40–1.20)
Calcium: 9.7 mg/dL (ref 8.4–10.5)
Chloride: 104 mEq/L (ref 96–112)
GFR: 84.4 mL/min (ref >60.00)
Glucose, Bld: 99 mg/dL (ref 70–99)
Potassium: 3.5 mEq/L (ref 3.5–5.1)
Sodium: 139 mEq/L (ref 135–145)

## 2014-09-27 LAB — CBC
HCT: 39.3 % (ref 36.0–46.0)
Hemoglobin: 13.1 g/dL (ref 12.0–15.0)
MCHC: 33.4 g/dL (ref 30.0–36.0)
MCV: 84.7 fl (ref 78.0–100.0)
Platelets: 287 10*3/uL (ref 150.0–400.0)
RBC: 4.64 Mil/uL (ref 3.87–5.11)
RDW: 13.7 % (ref 11.5–15.5)
WBC: 5.4 10*3/uL (ref 4.0–10.5)

## 2014-09-27 NOTE — Patient Instructions (Addendum)
Medication Instructions:  None  Labwork: BMET, CBC today  Your physician recommends that you return for lab work in: 6-8 weeks when you see Dr. Tamala Julian (CMET, Lipids)   Testing/Procedures: None  Follow-Up: Your physician recommends that you schedule a follow-up appointment in: 6-8 weeks with Dr. Tamala Julian.   Any Other Special Instructions Will Be Listed Below (If Applicable).  You have been referred to Cardiac Rehab at [facility].

## 2014-09-28 ENCOUNTER — Telehealth: Payer: Self-pay | Admitting: Cardiology

## 2014-09-28 MED ORDER — POTASSIUM CHLORIDE ER 20 MEQ PO TBCR: 20.0000 meq | EXTENDED_RELEASE_TABLET | Freq: Every day | ORAL | Status: DC

## 2014-09-28 NOTE — Telephone Encounter (Signed)
Discussed labs - discussed, per Dayna's note, starting potassium 59meq daily. Pt verbalized understanding of instruction - requested script to be sent to preferred pharmacy on file. This was completed.

## 2014-09-28 NOTE — Telephone Encounter (Signed)
New Message  Pt calling about test resuslts. Please call back and discuss.

## 2014-09-28 NOTE — Telephone Encounter (Signed)
Returning your call. °

## 2014-09-28 NOTE — Telephone Encounter (Signed)
No answer when dialed. 

## 2014-10-03 ENCOUNTER — Telehealth: Payer: Self-pay | Admitting: Cardiology

## 2014-10-03 NOTE — Telephone Encounter (Signed)
Returned call to patient she stated she has not heard from cardiac rehab in [facility].Cardiac rehab in [facility] called [phone] left message on voice mail patient calling has not heard back  when she can start cardiac rehab.Boyd.

## 2014-10-03 NOTE — Telephone Encounter (Signed)
Brooke Proctor is calling because she had a stent placed in and was told that she will be starting cardiac rehab in [place] and have'nt heard anything . Please call    Thanks

## 2014-10-04 NOTE — Telephone Encounter (Signed)
Received call back from [facility] with Cardiac Rehab in [facility].She stated she will be faxing Dr.Harding a cardiac rehab order to sign.Dr.Harding out of office until 10/10/14.Will send message to Dr.Harding's nurse.

## 2014-10-11 NOTE — Telephone Encounter (Signed)
Pt called in stating that since she has been on the Atorvastatin and Potassium she has been having some negative side effects. She said that she had to call the EMT a couple of nights ago because she was feeling so bad. She would like to know if any adjustments may need to be made to her medications. Please call  Thanks

## 2014-10-12 ENCOUNTER — Telehealth: Payer: Self-pay | Admitting: *Deleted

## 2014-10-12 NOTE — Telephone Encounter (Signed)
CARDIAC REHAB  SIGNED FORM SENT 10/10/2014

## 2014-10-12 NOTE — Telephone Encounter (Signed)
SPOKE TO PATIENT  STATES SHE HAD AN EPISODE LAST WEEKEND- CLAMMY, CHEST DISCOMFORT  GRADUAL INCREASE- IT OCCURRED  LATE AT NIGHT @11  PM  EMS  WAS CALLED  ,BUT DID NOT GO TO ER. PATIENT QUESTION IF TAKING ATORVASTATIN , OR POTASSIUM. RN INFORMED PATIENT NOT SURE OF WHAT MAY HAVE OCCURRED DO NOT THINK IT WAS MEDICATION WHICH CAUSE IT. CONTINUE TO MONITOR AND CONTACT OFFICE. PATIENT HAS AN APPOINTMENT 12/01/14 WITH DR HARDING. PATIENT VOICED  UNDERSTANDING.

## 2014-10-12 NOTE — Telephone Encounter (Signed)
LATE ENTRY FAXED ON 10/10/2014  SIGNED [facility] REFERRAL FORM BY DR Napa.

## 2014-10-19 ENCOUNTER — Other Ambulatory Visit: Payer: Self-pay | Admitting: Cardiology

## 2014-10-19 NOTE — Telephone Encounter (Signed)
Pt says the pharmacist said the doctor will need to call insurance company to get authorization for her [place]. Call [facility] at [phone].please call asap,only have 2 days left.

## 2014-10-19 NOTE — Telephone Encounter (Signed)
Called patient to offer her samples and a co-pay card while waiting for authorization approval.   Medication samples have been provided to the patient.  Drug name: Brilinta 90 mg Qty: 16 tabs LOT: [id number] Exp.Date: 02/2017 Drug name: Brilinta 90 mg Qty: 16 tabs LOT: [id number] Exp.Date: 03/2017  Samples left at front desk for patient pick-up. Patient notified.  [name], [place] 10:54 AM 10/19/2014

## 2014-10-24 ENCOUNTER — Telehealth: Payer: Self-pay | Admitting: Cardiology

## 2014-10-24 NOTE — Telephone Encounter (Addendum)
Prior authorization for Brilinta faxed to patient's insurance company - via [email or web address] Awaiting approval.

## 2014-10-25 ENCOUNTER — Other Ambulatory Visit (INDEPENDENT_AMBULATORY_CARE_PROVIDER_SITE_OTHER): Payer: 59 | Admitting: *Deleted

## 2014-10-25 DIAGNOSIS — Z9861 Coronary angioplasty status: Secondary | ICD-10-CM | POA: Diagnosis not present

## 2014-10-25 DIAGNOSIS — E785 Hyperlipidemia, unspecified: Secondary | ICD-10-CM | POA: Diagnosis not present

## 2014-10-25 DIAGNOSIS — I10 Essential (primary) hypertension: Secondary | ICD-10-CM

## 2014-10-25 DIAGNOSIS — I2510 Atherosclerotic heart disease of native coronary artery without angina pectoris: Secondary | ICD-10-CM | POA: Diagnosis not present

## 2014-10-25 LAB — LIPID PANEL
CHOLESTEROL: 98 mg/dL (ref 0–200)
HDL: 34.9 mg/dL — ABNORMAL LOW (ref >39.00)
LDL CALC: 54 mg/dL (ref 0–99)
NonHDL: 63.1
Total CHOL/HDL Ratio: 3
Triglycerides: 48 mg/dL (ref 0.0–149.0)
VLDL: 9.6 mg/dL (ref 0.0–40.0)

## 2014-10-25 LAB — COMPREHENSIVE METABOLIC PANEL
ALBUMIN: 3.8 g/dL (ref 3.5–5.2)
ALT: 18 U/L (ref 0–35)
AST: 20 U/L (ref 0–37)
Alkaline Phosphatase: 64 U/L (ref 39–117)
BUN: 15 mg/dL (ref 6–23)
CALCIUM: 9.6 mg/dL (ref 8.4–10.5)
CHLORIDE: 105 meq/L (ref 96–112)
CO2: 28 mEq/L (ref 19–32)
CREATININE: 0.88 mg/dL (ref 0.40–1.20)
GFR: 83.27 mL/min (ref >60.00)
Glucose, Bld: 103 mg/dL — ABNORMAL HIGH (ref 70–99)
POTASSIUM: 3.5 meq/L (ref 3.5–5.1)
Sodium: 140 mEq/L (ref 135–145)
Total Bilirubin: 0.6 mg/dL (ref 0.2–1.2)
Total Protein: 7 g/dL (ref 6.0–8.3)

## 2014-10-25 NOTE — Addendum Note (Signed)
Addended by: Eulis Foster on: 10/25/2014 07:56 AM   Modules accepted: Orders

## 2014-10-27 ENCOUNTER — Other Ambulatory Visit: Payer: Self-pay

## 2014-10-27 MED ORDER — POTASSIUM CHLORIDE ER 20 MEQ PO TBCR: 40.0000 meq | EXTENDED_RELEASE_TABLET | Freq: Every day | ORAL | Status: DC

## 2014-10-27 NOTE — Telephone Encounter (Signed)
Closed encounter °

## 2014-12-01 ENCOUNTER — Ambulatory Visit (INDEPENDENT_AMBULATORY_CARE_PROVIDER_SITE_OTHER): Payer: 59 | Admitting: Cardiology

## 2014-12-01 ENCOUNTER — Encounter: Payer: Self-pay | Admitting: Cardiology

## 2014-12-01 VITALS — BP 138/90 | HR 63 | Ht 66.0 in | Wt 198.3 lb

## 2014-12-01 DIAGNOSIS — I10 Essential (primary) hypertension: Secondary | ICD-10-CM

## 2014-12-01 DIAGNOSIS — E785 Hyperlipidemia, unspecified: Secondary | ICD-10-CM | POA: Diagnosis not present

## 2014-12-01 DIAGNOSIS — E669 Obesity, unspecified: Secondary | ICD-10-CM

## 2014-12-01 DIAGNOSIS — I214 Non-ST elevation (NSTEMI) myocardial infarction: Secondary | ICD-10-CM

## 2014-12-01 DIAGNOSIS — I2510 Atherosclerotic heart disease of native coronary artery without angina pectoris: Secondary | ICD-10-CM

## 2014-12-01 DIAGNOSIS — E876 Hypokalemia: Secondary | ICD-10-CM | POA: Diagnosis not present

## 2014-12-01 DIAGNOSIS — Z9861 Coronary angioplasty status: Secondary | ICD-10-CM

## 2014-12-01 MED ORDER — ATORVASTATIN CALCIUM 40 MG PO TABS: 40.0000 mg | ORAL_TABLET | Freq: Every day | ORAL | Status: DC

## 2014-12-01 MED ORDER — POTASSIUM CHLORIDE ER 20 MEQ PO TBCR: 40.0000 meq | EXTENDED_RELEASE_TABLET | Freq: Every day | ORAL | Status: DC

## 2014-12-01 NOTE — Progress Notes (Signed)
PCP: Nicola Girt, DO  Clinic Note: Chief Complaint  Patient presents with  . Hospitalization Follow-up    heart attack followup; occassional flutter chest pain, no shortness of breath, no edema, no pain in legs, no cramping in legs, no lightheadedness, no dizziness  . Coronary Artery Disease    NSTEMI - 2 vessel PCI    HPI: Brooke Proctor is a 64 y.o. female with a PMH below who presents today for two-month followup for CAD with recent non-STEMI in May 2016.  She had PCI to both the LAD and RCA for mild non-STEMI.  Brooke Proctor was last seen on  May 24 by Sharrell Ku, PA-C for initial post hospital followup.she indicated she was interested in starting cardiac rehabilitation and had referral placed to the [facility] center.  She has CBC checked due to mild anemia post cath.  Lipid panel was also ordered. Results noted below.  Recent Hospitalizations: non-STEMI on May 17, cardiac cath on the 18th revealed two-vessel disease with PCI to the LAD and RCA.  Studies Reviewed: 2D Echo & Cardiac Cath from May 2016 (PMH/PSH updated)  Interval History: Since her last visit, Brooke Proctor has been doing relatively well. She stopped doing cardiac rehabilitation, simply just because it was tiring her out with a strain in hours.  She had it up really early to get to rehabilitation prior to work, and this was just becoming too much a problem for her. Besides that, she really is doing fine without any major complaints. She has occasional fluttering sensation in her chest that comes and goes and is associated with a sense of inability to get her breath. This happens intermittently with no rhyme or reason. Otherwise, with her routine visit she is doing overall, she denies any anginal symptoms with rest or exertion. No dyspnea on exertion. She is open to get back into doing water aerobics, which she usually would do after work.  Cardiovascular ROS: no chest pain or dyspnea on exertion positive for -  shortness of breath and - difficulty catchning her breath, mild palpitations negative for - edema, loss of consciousness, murmur, orthopnea, paroxysmal nocturnal dyspnea, rapid heart rate or Near syncope, TIA/amaurosis fugax   Past Medical History  Diagnosis Date  . Thyroid disease   . Breast cancer   . Status post chemotherapy   . History of radiation therapy   . Essential hypertension   . NSTEMI (non-ST elevated myocardial infarction) 09/19/2014  . CAD S/P percutaneous coronary angioplasty     a. NSTEMI 09/2014: s/p DES to mLAD and mRCA. Normal EF.  Marland Kitchen Hyperlipidemia   . Mild valvular heart disease     a. Echo 09/2014: mild AI/MR.  . Obesity     Past Surgical History  Procedure Laterality Date  . Lymph node dissection    . Cardiac catheterization N/A 09/20/2014    Procedure: Left Heart Cath and Coronary Angiography;  Surgeon: Peter M Martinique, MD;  Location: [place] [facility];  Service: Cardiovascular; 80% mLAD, 90% mRCA  . Cardiac catheterization  09/20/2014    Procedure: Coronary Stent Intervention;  Surgeon: Peter M Martinique, MD;  Location: [place] [facility];  Service: Cardiovascular;;mLAD Promus Premeir DES 2.5 mm x 16 mm , mRCA - Promus Premier DES 4.0 mm x 16 mm (4.5 mm)   . Transthoracic echocardiogram  09/21/2014    Normal LV Size, mild LVH.  EF 60-65%, Gr 1 DD, mild AI & MR   ROS: A comprehensive was performed.  Review of Systems  Constitutional: Negative for malaise/fatigue.  HENT: Negative for nosebleeds.   Eyes: Negative for blurred vision.  Respiratory: Positive for shortness of breath (See above). Negative for cough and wheezing.   Cardiovascular: Negative for claudication.  Gastrointestinal: Negative for constipation, blood in stool and melena.  Genitourinary: Negative for hematuria.  Musculoskeletal: Negative for myalgias.  Neurological: Negative for dizziness and headaches.  Endo/Heme/Allergies: Does not bruise/bleed easily.  Psychiatric/Behavioral: Negative.   All  other systems reviewed and are negative.   Prior to Admission medications   Medication Sig Start Date End Date Taking? Authorizing Provider  guaiFENesin 200 MG tablet Take 200 mg by mouth every 4 (four) hours as needed for cough or to loosen phlegm.   Yes Historical Provider, MD  hydrochlorothiazide (HYDRODIURIL) 25 MG tablet Take 25 mg by mouth daily.   Yes Historical Provider, MD  levothyroxine (SYNTHROID, LEVOTHROID) 100 MCG tablet Take 100 mcg by mouth daily before breakfast.   Yes Greta O'Buch, PA-C  nitroGLYCERIN (NITROSTAT) 0.4 MG SL tablet Place 1 tablet (0.4 mg total) under the tongue every 5 (five) minutes x 3 doses as needed for chest pain. 09/21/14  Yes Erlene Quan, PA-C  Omega-3 Fatty Acids (FISH OIL) 1000 MG CAPS Take 1,000 mg by mouth daily.   Yes Historical Provider, MD  Potassium Chloride ER 20 MEQ TBCR Take 40 mEq by mouth daily. 12/01/14  Yes Leonie Man, MD  PROAIR RESPICLICK 010 (90 BASE) MCG/ACT AEPB Inhale 1 puff into the lungs every 4 (four) hours as needed. (SHORTNESS OF BREATH) 09/11/14  Yes Historical Provider, MD  ticagrelor (BRILINTA) 90 MG TABS tablet Take 1 tablet (90 mg total) by mouth 2 (two) times daily. 09/21/14  Yes Erlene Quan, PA-C  acetaminophen (TYLENOL) 325 MG tablet Take 2 tablets (650 mg total) by mouth every 4 (four) hours as needed for headache or mild pain. 09/21/14   Erlene Quan, PA-C  aspirin EC 81 MG EC tablet Take 1 tablet (81 mg total) by mouth daily. 09/21/14   Erlene Quan, PA-C  atorvastatin (LIPITOR) 40 MG tablet Take 1 tablet (40 mg total) by mouth at bedtime. 12/01/14   Leonie Man, MD  benazepril (LOTENSIN) 20 MG tablet Take 20 mg by mouth daily.    Historical Provider, MD  benzonatate (TESSALON) 100 MG capsule Take 100 mg by mouth every 8 (eight) hours as needed for cough.    Historical Provider, MD  Biotin 1000 MCG tablet Take 1,000 mcg by mouth daily.    Historical Provider, MD  ergocalciferol (VITAMIN D2) 50000 UNITS capsule Take  50,000 Units by mouth once a week. Take on sundays    Historical Provider, MD  fluticasone (FLONASE) 50 MCG/ACT nasal spray Place 2 sprays into both nostrils daily. 09/12/14   Historical Provider, MD  metoprolol succinate (TOPROL-XL) 25 MG 24 hr tablet Take 1 tablet (25 mg total) by mouth daily. 09/21/14   Erlene Quan, PA-C  Atorvastatin just reduced from 80 mg to 40.  Allergies  Allergen Reactions  . Eggs Or Egg-Derived Products Swelling    ALLERGY: EGG WHITES  . Nickel Swelling and Other (See Comments)    REACTION:"BLISTERS"  . Adhesive [Tape] Itching  . Latex Itching    History   Social History  . Marital Status: Divorced    Spouse Name: N/A  . Number of Children: N/A  . Years of Education: N/A   Occupational History  . Not on file.   Social  History Main Topics  . Smoking status: Never Smoker   . Smokeless tobacco: Never Used  . Alcohol Use: No  . Drug Use: No  . Sexual Activity: Not on file   Other Topics Concern  . Not on file   Social History Narrative   Family History  Problem Relation Age of Onset  . CAD Neg Hx   . Hypertension Mother   . Stroke Mother   . Hypertension Father   . Kidney disease Father     Wt Readings from Last 3 Encounters:  12/01/14 89.954 kg (198 lb 5 oz)  09/27/14 85.73 kg (189 lb)  09/21/14 89.4 kg (197 lb 1.5 oz)    PHYSICAL EXAM BP 138/90 mmHg  Pulse 63  Ht 5\' 6"  (1.676 m)  Wt 89.954 kg (198 lb 5 oz)  BMI 32.02 kg/m2 General appearance: alert, cooperative, appears stated age, no distress and mildly obese; normal mood and affect. Neck: no adenopathy, no carotid bruit and no JVD Lungs: clear to auscultation bilaterally, normal percussion bilaterally and non-labored Heart: regular rate and rhythm, S1, S2 normal, no murmur, click, rub or gallop ; nondisplaced PMI Abdomen: soft, non-tender; bowel sounds normal; no masses,  no organomegaly; no HJR Extremities: extremities normal, atraumatic, no cyanosis, and edema Pulses: 2+  and symmetric;  Skin: normal Neurologic: Mental status: Alert, oriented, thought content appropriate Cranial nerves: normal (II-XII grossly intact)   Adult ECG Report  Rate: 63 ;  Rhythm: normal sinus rhythm, Left Axis Deviation, incomplete RBBB, CRO left atrial enlargement,.  Narrative Interpretation: stable EKG. Left axis not quite to the extent of the LAFB noted in previous EKG  Other studies Reviewed: Additional studies/ records that were reviewed today include:  Recent Labs:   Lab Results  Component Value Date   CHOL 98 10/25/2014   HDL 34.90* 10/25/2014   LDLCALC 54 10/25/2014   TRIG 48.0 10/25/2014   CHOLHDL 3 10/25/2014   Lab Results  Component Value Date   HGB 13.1 09/27/2014     ASSESSMENT / PLAN: Problem List Items Addressed This Visit    CAD S/P percutaneous coronary angioplasty    2 DES stents. On dual antiplatelet therapy with aspirin and Brilinta. I think the brief episodes of difficulty breathing is probably related to the Brilinta. It doesn't bother her, so for now we'll just simply continue with Brilinta. She is on an ACE inhibitor as home medication along with a beta blocker appropriately long-acting. She is on statin which I think we can reduce to 40 mg.      Relevant Medications   atorvastatin (LIPITOR) 40 MG tablet   Other Relevant Orders   EKG 12-Lead   Lipid panel   Comprehensive metabolic panel   Essential hypertension - Primary (Chronic)    Borderline pressures today.  Currently taking ACE inhibitor, HCTZ and beta blocker. I am reluctant at a new medication. Hoping that with her getting back into exercise, the blood pressure will stabilize. She thinks is usually lower than this at home. I will not add any medications at this time.      Relevant Medications   atorvastatin (LIPITOR) 40 MG tablet   Other Relevant Orders   EKG 12-Lead   Lipid panel   Comprehensive metabolic panel   Hyperlipidemia with target LDL less than 70 (Chronic)    Labs  were just checked. Dramatic improvement in overall numbers. I think we can safely reduce her pravastatin to 40 mg. We'll need to recheck labs in roughly December/January  timeframe - prior to her next followup visit.      Relevant Medications   atorvastatin (LIPITOR) 40 MG tablet   Hypokalemia (Chronic)    She is on chronic, standing potassium supplementation. This may be related to HCTZ. We can reassess her potassium level along with her lipid panel this winter.      NSTEMI (non-ST elevated myocardial infarction)   Relevant Medications   atorvastatin (LIPITOR) 40 MG tablet   Obesity (Chronic)    The patient understands the need to lose weight with diet and exercise. We have discussed specific strategies for this.         Current medicines are reviewed at length with the patient today. (+/- concerns):dose of atorvastatin; need for potassium; also asked about dietary supplementation coenzyme Q 10 and other vitamins. The following changes have been made: reducing to atorvastatin 40 mg  Patient Instructions: LABS - LIPID , CMP - DEC 2016- WILL SEND YOU THE LAB SLIP  IN NOV 2016  DECREASE ATORVASTATIN 40 MG  TAKE AT NIGHT START TAKING METOPROLOL AT  NIGHT  Your physician wants you to follow-up in [place] 2016 DR Frederich Montilla.   Leonie Man, M.D., M.S. Interventional Cardiologist   Pager # [phone]

## 2014-12-01 NOTE — Patient Instructions (Signed)
LABS - LIPID , CMP - DEC 2016- WILL SEND YOU THE LAB SLIP  IN NOV 2016  DECREASE ATORVASTATIN 40 MG  TAKE AT NIGHT START TAKING METOPROLOL AT  NIGHT  Your physician wants you to follow-up in [place] 2016 DR HARDING.  You will receive a reminder letter in the mail two months in advance. If you don't receive a letter, please call our office to schedule the follow-up appointment.

## 2014-12-03 ENCOUNTER — Encounter: Payer: Self-pay | Admitting: Cardiology

## 2014-12-03 DIAGNOSIS — E876 Hypokalemia: Secondary | ICD-10-CM | POA: Insufficient documentation

## 2014-12-03 NOTE — Assessment & Plan Note (Signed)
She is on chronic, standing potassium supplementation. This may be related to HCTZ. We can reassess her potassium level along with her lipid panel this winter.

## 2014-12-03 NOTE — Assessment & Plan Note (Signed)
2 DES stents. On dual antiplatelet therapy with aspirin and Brilinta. I think the brief episodes of difficulty breathing is probably related to the Brilinta. It doesn't bother her, so for now we'll just simply continue with Brilinta. She is on an ACE inhibitor as home medication along with a beta blocker appropriately long-acting. She is on statin which I think we can reduce to 40 mg.

## 2014-12-03 NOTE — Assessment & Plan Note (Signed)
Labs were just checked. Dramatic improvement in overall numbers. I think we can safely reduce her pravastatin to 40 mg. We'll need to recheck labs in roughly December/January timeframe - prior to her next followup visit.

## 2014-12-03 NOTE — Assessment & Plan Note (Signed)
Borderline pressures today.  Currently taking ACE inhibitor, HCTZ and beta blocker. I am reluctant at a new medication. Hoping that with her getting back into exercise, the blood pressure will stabilize. She thinks is usually lower than this at home. I will not add any medications at this time.

## 2014-12-03 NOTE — Assessment & Plan Note (Signed)
The patient understands the need to lose weight with diet and exercise. We have discussed specific strategies for this.  

## 2015-02-12 ENCOUNTER — Telehealth: Payer: Self-pay | Admitting: Cardiology

## 2015-02-12 NOTE — Telephone Encounter (Signed)
She need a note stating that it is all right for her to have dental work. Dr Rozell Searing is the dentist.her phone number [place] [phone]. She had a lot of blood in her urine last Friday,she does not know if it might be a side effect from some of her medicine.She is going to see a Facilities manager on Wednesday.Marland Kitchen

## 2015-02-12 NOTE — Telephone Encounter (Signed)
No need for Abx for dental procedures.  Has only been 5 months post PCI - would prefer not, to stop antiplatelets.  Leonie Man, MD

## 2015-02-12 NOTE — Telephone Encounter (Signed)
Pt called in stating that she needed to provider a fax number to her dentist office so that a letter could be sent to them stating that she can have dental work with needing to take an antibiotic. She would like this faxed to the office this morning if possible because she has an appt. The fax number is [phone].  THanks

## 2015-02-12 NOTE — Telephone Encounter (Signed)
Called home/mobile number. Rings w/ no VM pickup.

## 2015-02-13 ENCOUNTER — Encounter: Payer: Self-pay | Admitting: *Deleted

## 2015-02-13 NOTE — Telephone Encounter (Signed)
Faxed recommendations to dentist office at number provided - verified w office.

## 2015-03-20 ENCOUNTER — Telehealth: Payer: Self-pay | Admitting: *Deleted

## 2015-03-20 DIAGNOSIS — I2510 Atherosclerotic heart disease of native coronary artery without angina pectoris: Secondary | ICD-10-CM

## 2015-03-20 DIAGNOSIS — I10 Essential (primary) hypertension: Secondary | ICD-10-CM

## 2015-03-20 DIAGNOSIS — Z9861 Coronary angioplasty status: Secondary | ICD-10-CM

## 2015-03-20 NOTE — Telephone Encounter (Signed)
-----   Message from Raiford Simmonds, RN sent at 12/01/2014  6:50 PM EDT ----- [facility] IN NOV 2016 LIPID AND CMP DUE IN DEC 12 /29/16

## 2015-03-20 NOTE — Telephone Encounter (Signed)
Mail letter and lab slip. CMP,LIPID. 

## 2015-04-17 ENCOUNTER — Ambulatory Visit: Payer: 59 | Admitting: Cardiology

## 2015-06-03 ENCOUNTER — Other Ambulatory Visit: Payer: Self-pay | Admitting: Cardiology

## 2015-06-04 ENCOUNTER — Encounter: Payer: Self-pay | Admitting: Cardiology

## 2015-06-04 ENCOUNTER — Ambulatory Visit (INDEPENDENT_AMBULATORY_CARE_PROVIDER_SITE_OTHER): Payer: 59 | Admitting: Cardiology

## 2015-06-04 VITALS — BP 148/92 | HR 64 | Ht 66.0 in | Wt 214.0 lb

## 2015-06-04 DIAGNOSIS — I2510 Atherosclerotic heart disease of native coronary artery without angina pectoris: Secondary | ICD-10-CM

## 2015-06-04 DIAGNOSIS — I252 Old myocardial infarction: Secondary | ICD-10-CM | POA: Diagnosis not present

## 2015-06-04 DIAGNOSIS — E785 Hyperlipidemia, unspecified: Secondary | ICD-10-CM

## 2015-06-04 DIAGNOSIS — Z9861 Coronary angioplasty status: Secondary | ICD-10-CM

## 2015-06-04 DIAGNOSIS — I10 Essential (primary) hypertension: Secondary | ICD-10-CM | POA: Diagnosis not present

## 2015-06-04 DIAGNOSIS — E669 Obesity, unspecified: Secondary | ICD-10-CM

## 2015-06-04 MED ORDER — ATORVASTATIN CALCIUM 40 MG PO TABS: 40.0000 mg | ORAL_TABLET | Freq: Every day | ORAL | Status: DC

## 2015-06-04 MED ORDER — TICAGRELOR 90 MG PO TABS: 90.0000 mg | ORAL_TABLET | Freq: Two times a day (BID) | ORAL | Status: DC

## 2015-06-04 MED ORDER — HYDROCHLOROTHIAZIDE 25 MG PO TABS: 25.0000 mg | ORAL_TABLET | Freq: Every day | ORAL | Status: DC

## 2015-06-04 MED ORDER — BENAZEPRIL HCL 20 MG PO TABS: 20.0000 mg | ORAL_TABLET | Freq: Every day | ORAL | Status: DC

## 2015-06-04 MED ORDER — METOPROLOL SUCCINATE ER 25 MG PO TB24: 25.0000 mg | ORAL_TABLET | Freq: Every day | ORAL | Status: DC

## 2015-06-04 NOTE — Patient Instructions (Signed)
NO CHANGE WITH CURRENT MEDICATIONS  MEDICATIONS REFILLED -90 DAY SUPPLY.  Your physician wants you to follow-up in June 2017 WITH DR [facility].  If you need a refill on your cardiac medications before your next appointment, please call your pharmacy.

## 2015-06-04 NOTE — Progress Notes (Signed)
PCP: Nicola Girt, DO  Clinic Note: Chief Complaint  Patient presents with  . Follow-up    some chest pain, some shortness of breath, no swelling, no cramping, no dizziness or lightheadedness  . Coronary Artery Disease    PCI to LAD and RCA for non-STEMI (May 2016)    HPI: Brooke Proctor is a 65 y.o. female with a PMH below who presents today for six-month follow-up of CAD-PCI following non-STEMI 09/19/2014 --> PCI to both the LAD and RCA with DES.Marland Kitchen  Brooke Proctor was last seen on Dec 03, 2014 for 2nd post-NSTEMI f/u. She was doing relatively well, but stopped going to cardiac rehabilitation because of the timing. Noted some mild fluttering sensations but no significant symptoms.  Recent Hospitalizations: None  Studies Reviewed: See labs below  Her mother died yesterday after long struggle.    Interval History: Brooke Proctor presents doing pretty well. She had some occasional discomfort for the right side of her chest back January 20. She went to urgent care and had EKG that was relatively normal. Did not a positive markers and therefore did not have any further workup. She really hasn't had any more chest pain since that. Has been on a little bit of stress recently her mother just died yesterday which has her under some stress in that when she notices some palpitations and strains discomfort. Her blood pressure may be of little bit today because she's been more stressed than usual.  She is thinking that maybe now that she is not doing the digital with her mother, she may be a get back into doing some exercise. She is hoping to lose weight. Otherwise besides that one episode of chest discomfort she has not had any further chest discomfort or pressure with rest or exertion.  No PND, orthopnea or edema. Mild palpitations, but no lightheadedness, dizziness, weakness or syncope/near syncope.  No TIA/amaurosis fugax symptoms. No melena, hematochezia, or epstaxis. No  claudication.  Mild hematuria --> ~2 weeks ago --> CT with contrast showed small nephrolithiasis with inflammation (may have passed one in Nov - had some hematuria   ROS: A comprehensive was performed. Review of Systems  Constitutional: Negative for malaise/fatigue.  HENT: Negative for nosebleeds.   Respiratory: Negative for cough, shortness of breath and wheezing.   Cardiovascular: Negative for claudication.  Gastrointestinal: Negative for blood in stool and melena.  Genitourinary: Positive for hematuria.  Musculoskeletal: Positive for joint pain (normal ache and pains). Negative for myalgias and falls.  Neurological: Negative for dizziness, weakness and headaches.  Endo/Heme/Allergies: Negative.   Psychiatric/Behavioral: Negative for memory loss.       Somewhat sad and stressed with the recent death of her mother.  All other systems reviewed and are negative.   Past Medical History  Diagnosis Date  . Thyroid disease   . Breast cancer ([place])   . Status post chemotherapy   . History of radiation therapy   . Essential hypertension   . NSTEMI (non-ST elevated myocardial infarction) ([place]) 09/19/2014  . CAD S/P percutaneous coronary angioplasty     a. NSTEMI 09/2014: s/p DES to mLAD and mRCA. Normal EF.  Marland Kitchen Hyperlipidemia   . Mild valvular heart disease     a. Echo 09/2014: mild AI/MR.  . Obesity     Past Surgical History  Procedure Laterality Date  . Lymph node dissection    . Cardiac catheterization N/A 09/20/2014    Procedure: Left Heart Cath and Coronary Angiography;  Surgeon: Ander Slade  Martinique, MD;  Location: [facility];  Service: Cardiovascular; 80% mLAD, 90% mRCA  . Cardiac catheterization  09/20/2014    Procedure: Coronary Stent Intervention;  Surgeon: Peter M Martinique, MD;  Location: [facility];  Service: Cardiovascular;;mLAD Promus Premeir DES 2.5 mm x 16 mm , mRCA - Promus Premier DES 4.0 mm x 16 mm (4.5 mm)   . Transthoracic echocardiogram  09/21/2014    Normal  LV Size, mild LVH.  EF 60-65%, Gr 1 DD, mild AI & MR    Prior to Admission medications   Medication Sig Start Date End Date Taking? Authorizing Provider  acetaminophen (TYLENOL) 325 MG tablet Take 2 tablets (650 mg total) by mouth every 4 (four) hours as needed for headache or mild pain. 09/21/14  Yes Luke K Kilroy, PA-C  atorvastatin (LIPITOR) 40 MG tablet Take 1 tablet (40 mg total) by mouth at bedtime. 12/01/14  Yes Leonie Man, MD  benazepril (LOTENSIN) 20 MG tablet Take 20 mg by mouth daily.   Yes Historical Provider, MD  Biotin 1000 MCG tablet Take 1,000 mcg by mouth daily.   Yes Historical Provider, MD  ergocalciferol (VITAMIN D2) 50000 UNITS capsule Take 50,000 Units by mouth once a week. Take on sundays   Yes Historical Provider, MD  fluticasone (FLONASE) 50 MCG/ACT nasal spray Place 2 sprays into both nostrils daily. 09/12/14  Yes Historical Provider, MD  guaiFENesin 200 MG tablet Take 200 mg by mouth every 4 (four) hours as needed for cough or to loosen phlegm.   Yes Historical Provider, MD  hydrochlorothiazide (HYDRODIURIL) 25 MG tablet Take 25 mg by mouth daily.   Yes Historical Provider, MD  levothyroxine (SYNTHROID, LEVOTHROID) 100 MCG tablet Take 100 mcg by mouth daily before breakfast.   Yes Greta O'Buch, PA-C  meclizine (ANTIVERT) 12.5 MG tablet Take 12.5 mg by mouth 3 (three) times daily as needed for dizziness.   Yes Historical Provider, MD  metoprolol succinate (TOPROL-XL) 25 MG 24 hr tablet Take 1 tablet (25 mg total) by mouth daily. 09/21/14  Yes Luke K Kilroy, PA-C  nitroGLYCERIN (NITROSTAT) 0.4 MG SL tablet Place 1 tablet (0.4 mg total) under the tongue every 5 (five) minutes x 3 doses as needed for chest pain. 09/21/14  Yes Erlene Quan, PA-C  Omega-3 Fatty Acids (FISH OIL) 1000 MG CAPS Take 1,000 mg by mouth daily.   Yes Historical Provider, MD  Potassium Chloride ER 20 MEQ TBCR Take 40 mEq by mouth daily. 12/01/14  Yes Leonie Man, MD  ticagrelor (BRILINTA) 90 MG  TABS tablet Take 1 tablet (90 mg total) by mouth 2 (two) times daily. 09/21/14  Yes Erlene Quan, PA-C    Allergies  Allergen Reactions  . Eggs Or Egg-Derived Products Swelling    ALLERGY: EGG WHITES  . Nickel Swelling and Other (See Comments)    REACTION:"BLISTERS"  . Adhesive [Tape] Itching  . Latex Itching   Social History   Social History  . Marital Status: Divorced    Spouse Name: N/A  . Number of Children: N/A  . Years of Education: N/A   Social History Main Topics  . Smoking status: Never Smoker   . Smokeless tobacco: Never Used  . Alcohol Use: No  . Drug Use: No  . Sexual Activity: Not Asked   Other Topics Concern  . None   Social History Narrative   Family History  Problem Relation Age of Onset  . CAD Neg Hx   .  Hypertension Mother   . Stroke Mother   . Hypertension Father   . Kidney disease Father     Wt Readings from Last 3 Encounters:  06/04/15 214 lb (97.07 kg)  12/01/14 198 lb 5 oz (89.954 kg)  09/27/14 189 lb (85.73 kg)  -- Wgt. Gain over holidays.  Not exercising & not eating well.  PHYSICAL EXAM BP 148/92 mmHg  Pulse 64  Ht 5\' 6"  (1.676 m)  Wt 214 lb (97.07 kg)  BMI 34.56 kg/m2 General appearance: alert, cooperative, appears stated age, no distress and mildly obese; normal mood and affect. Neck: no adenopathy, no carotid bruit and no JVD Lungs: clear to auscultation bilaterally, normal percussion bilaterally and non-labored Heart: regular rate and rhythm, S1, S2 normal, no murmur, click, rub or gallop ; nondisplaced PMI Abdomen: soft, non-tender; bowel sounds normal; no masses, no organomegaly; no HJR Extremities: extremities normal, atraumatic, no cyanosis, and edema Pulses: 2+ and symmetric;  Skin: normal Neurologic: Mental status: Alert, oriented, thought content appropriate Cranial nerves: normal (II-XII grossly intact)   Adult ECG Report  Rate: 64 ;  Rhythm: normal sinus rhythm and LAD (-38), borderline iron RBBB, borderline  LVH. Otherwise normal axis, intervals and durations;   Narrative Interpretation: Stable EKG   Other studies Reviewed: Additional studies/ records that were reviewed today include:  Recent Labs:    Lab Results  Component Value Date   CHOL 98 10/25/2014   HDL 34.90* 10/25/2014   LDLCALC 54 10/25/2014   TRIG 48.0 10/25/2014   CHOLHDL 3 10/25/2014    ASSESSMENT / PLAN: Problem List Items Addressed This Visit    Obesity (Chronic)    She actually gained weight. Hoping to now get back into a better activity plan and lose weight back. Discussed dietary modifications as well as exercise.      Hyperlipidemia with target LDL less than 70 (Chronic)    Probably due for repeat labs this summer. Can order it next visit. She is on moderate dose atorvastatin. Was postdilated labs checked for this visit, but this was not done. Based on the way to the numbers looked on preliminary reports, I think we can wait summer.      Relevant Medications   atorvastatin (LIPITOR) 40 MG tablet   benazepril (LOTENSIN) 20 MG tablet   hydrochlorothiazide (HYDRODIURIL) 25 MG tablet   metoprolol succinate (TOPROL-XL) 25 MG 24 hr tablet   Other Relevant Orders   EKG 12-Lead (Completed)   History of non-STEMI (Chronic)    No recurrent symptoms of angina. Normal EF. No heart failure symptoms.      Essential hypertension - Primary (Chronic)    Pressures are little bit elevated today, but she is under a lot of stress with her mother death. On ACE inhibitor and HCTZ combo and beta blocker. With her heart rate, I can't really increase the beta blocker much. She is on high-dose benazepril. Continue to monitor.      Relevant Medications   atorvastatin (LIPITOR) 40 MG tablet   benazepril (LOTENSIN) 20 MG tablet   hydrochlorothiazide (HYDRODIURIL) 25 MG tablet   metoprolol succinate (TOPROL-XL) 25 MG 24 hr tablet   Other Relevant Orders   EKG 12-Lead (Completed)   CAD S/P percutaneous coronary angioplasty (Chronic)     Over half a year status post 2 vessel PCI of the setting of non-STEMI. On Brilinta without aspirin. No bleeding issues. On statin, ACE inhibitor, beta blocker.      Relevant Medications   atorvastatin (LIPITOR) 40 MG  tablet   benazepril (LOTENSIN) 20 MG tablet   hydrochlorothiazide (HYDRODIURIL) 25 MG tablet   metoprolol succinate (TOPROL-XL) 25 MG 24 hr tablet   Other Relevant Orders   EKG 12-Lead (Completed)      Current medicines are reviewed at length with the patient today. (+/- concerns) none The following changes have been made: None Refilled medications 90 day supply.  Studies Ordered:   Orders Placed This Encounter  Procedures  . EKG 12-Lead    ROV June 2017   Leonie Man, M.D., M.S. Interventional Cardiologist   Pager # [phone] Phone # [phone] [address] [place], [place] [address]

## 2015-06-04 NOTE — Telephone Encounter (Signed)
REFILL 

## 2015-06-06 ENCOUNTER — Encounter: Payer: Self-pay | Admitting: Cardiology

## 2015-06-06 NOTE — Assessment & Plan Note (Signed)
Probably due for repeat labs this summer. Can order it next visit. She is on moderate dose atorvastatin. Was postdilated labs checked for this visit, but this was not done. Based on the way to the numbers looked on preliminary reports, I think we can wait summer.

## 2015-06-06 NOTE — Assessment & Plan Note (Signed)
Over half a year status post 2 vessel PCI of the setting of non-STEMI. On Brilinta without aspirin. No bleeding issues. On statin, ACE inhibitor, beta blocker.

## 2015-06-06 NOTE — Assessment & Plan Note (Signed)
Pressures are little bit elevated today, but she is under a lot of stress with her mother death. On ACE inhibitor and HCTZ combo and beta blocker. With her heart rate, I can't really increase the beta blocker much. She is on high-dose benazepril. Continue to monitor.

## 2015-06-06 NOTE — Assessment & Plan Note (Signed)
No recurrent symptoms of angina. Normal EF. No heart failure symptoms.

## 2015-06-06 NOTE — Assessment & Plan Note (Signed)
She actually gained weight. Hoping to now get back into a better activity plan and lose weight back. Discussed dietary modifications as well as exercise.

## 2015-07-12 ENCOUNTER — Telehealth: Payer: Self-pay | Admitting: Cardiology

## 2015-07-12 NOTE — Telephone Encounter (Signed)
Received records from [facility] as requested by Dr Ellyn Hack.  Records given to Dr Ellyn Hack to review.

## 2015-09-27 ENCOUNTER — Other Ambulatory Visit: Payer: Self-pay | Admitting: Cardiology

## 2015-09-27 NOTE — Telephone Encounter (Signed)
Rx(s) sent to pharmacy electronically.  

## 2015-10-04 HISTORY — PX: OTHER SURGICAL HISTORY: SHX169

## 2015-10-15 ENCOUNTER — Other Ambulatory Visit: Payer: Self-pay | Admitting: Cardiology

## 2015-10-15 NOTE — Telephone Encounter (Signed)
Metoprolol succinate refills for 1 year supply in jan 2017

## 2015-10-16 ENCOUNTER — Ambulatory Visit: Payer: 59 | Admitting: Cardiology

## 2015-10-18 ENCOUNTER — Encounter: Payer: Self-pay | Admitting: Cardiology

## 2015-10-18 ENCOUNTER — Ambulatory Visit (INDEPENDENT_AMBULATORY_CARE_PROVIDER_SITE_OTHER): Payer: 59 | Admitting: Cardiology

## 2015-10-18 VITALS — BP 118/78 | HR 72 | Ht 66.0 in | Wt 211.0 lb

## 2015-10-18 DIAGNOSIS — E669 Obesity, unspecified: Secondary | ICD-10-CM

## 2015-10-18 DIAGNOSIS — I10 Essential (primary) hypertension: Secondary | ICD-10-CM

## 2015-10-18 DIAGNOSIS — E785 Hyperlipidemia, unspecified: Secondary | ICD-10-CM | POA: Diagnosis not present

## 2015-10-18 DIAGNOSIS — I252 Old myocardial infarction: Secondary | ICD-10-CM

## 2015-10-18 DIAGNOSIS — Z9861 Coronary angioplasty status: Secondary | ICD-10-CM

## 2015-10-18 DIAGNOSIS — I2510 Atherosclerotic heart disease of native coronary artery without angina pectoris: Secondary | ICD-10-CM | POA: Diagnosis not present

## 2015-10-18 DIAGNOSIS — R42 Dizziness and giddiness: Secondary | ICD-10-CM

## 2015-10-18 MED ORDER — METOPROLOL SUCCINATE ER 25 MG PO TB24: 25.0000 mg | ORAL_TABLET | Freq: Every day | ORAL | Status: DC

## 2015-10-18 MED ORDER — POTASSIUM CHLORIDE CRYS ER 20 MEQ PO TBCR: 40.0000 meq | EXTENDED_RELEASE_TABLET | Freq: Every day | ORAL | Status: DC

## 2015-10-18 MED ORDER — TICAGRELOR 90 MG PO TABS: 90.0000 mg | ORAL_TABLET | Freq: Two times a day (BID) | ORAL | Status: DC

## 2015-10-18 NOTE — Progress Notes (Signed)
Cardiology Office Note   Date:  10/18/2015   ID:  Brooke Proctor, DOB 1950/11/23, MRN [id number]  PCP:  Brooke Girt, DO  Cardiologist:  Dr. Ellyn Proctor.    Chief Complaint  Patient presents with  . Coronary Artery Disease      History of Present Illness: Brooke Proctor is a 65 y.o. female who presents for follow up of her CAD.   PMH below who presents today for follow-up of CAD-PCI following non-STEMI 09/19/2014 --> PCI to both the LAD and RCA with DES.Brooke Proctor  Today she has only complaint of SOB with climbing stairs and only at end of day and become woozy.  No chest pain.  She has to sit until not woozy anymore.  No awareness of rapid HR.  No chest pain.  She also complains of bruising with brilinta and is no longer on ASA.  Past Medical History  Diagnosis Date  . Thyroid disease   . Breast cancer ([place])   . Status post chemotherapy   . History of radiation therapy   . Essential hypertension   . NSTEMI (non-ST elevated myocardial infarction) (Brooke Proctor) 09/19/2014  . CAD S/P percutaneous coronary angioplasty     a. NSTEMI 09/2014: s/p DES to mLAD and mRCA. Normal EF.  Brooke Proctor Hyperlipidemia   . Mild valvular heart disease     a. Echo 09/2014: mild AI/MR.  . Obesity     Past Surgical History  Procedure Laterality Date  . Lymph node dissection    . Cardiac catheterization N/A 09/20/2014    Procedure: Left Heart Cath and Coronary Angiography;  Surgeon: Brooke M Martinique, MD;  Location: [facility];  Service: Cardiovascular; 80% mLAD, 90% mRCA  . Cardiac catheterization  09/20/2014    Procedure: Coronary Stent Intervention;  Surgeon: Brooke M Martinique, MD;  Location: [facility];  Service: Cardiovascular;;mLAD Promus Premeir DES 2.5 mm x 16 mm , mRCA - Promus Premier DES 4.0 mm x 16 mm (4.5 mm)   . Transthoracic echocardiogram  09/21/2014    Normal LV Size, mild LVH.  EF 60-65%, Gr 1 DD, mild AI & MR     Current Outpatient Prescriptions  Medication Sig Dispense Refill  .  acetaminophen (TYLENOL) 325 MG tablet Take 2 tablets (650 mg total) by mouth every 4 (four) hours as needed for headache or mild pain.    Brooke Proctor atorvastatin (LIPITOR) 40 MG tablet Take 1 tablet (40 mg total) by mouth at bedtime. 90 tablet 3  . benazepril (LOTENSIN) 20 MG tablet Take 1 tablet (20 mg total) by mouth daily. 90 tablet 3  . Biotin 1000 MCG tablet Take 1,000 mcg by mouth daily.    . ergocalciferol (VITAMIN D2) 50000 UNITS capsule Take 50,000 Units by mouth once a week. Take on sundays    . fluticasone (FLONASE) 50 MCG/ACT nasal spray Place 2 sprays into both nostrils daily.    Brooke Proctor guaiFENesin 200 MG tablet Take 200 mg by mouth every 4 (four) hours as needed for cough or to loosen phlegm.    . hydrochlorothiazide (HYDRODIURIL) 25 MG tablet Take 1 tablet (25 mg total) by mouth daily. 90 tablet 3  . levothyroxine (SYNTHROID, LEVOTHROID) 100 MCG tablet Take 100 mcg by mouth daily before breakfast.    . meclizine (ANTIVERT) 12.5 MG tablet Take 12.5 mg by mouth 3 (three) times daily as needed for dizziness.    . metoprolol succinate (TOPROL-XL) 25 MG 24 hr tablet Take 1 tablet (25 mg total)  by mouth daily. 90 tablet 1  . nitroGLYCERIN (NITROSTAT) 0.4 MG SL tablet PLACE 1 TABLET UNDER THE TONGUE EVERY 5 MINUTES FOR 3 DOSES AS NEEDED FOR CHEST PAIN 25 tablet 2  . Omega-3 Fatty Acids (FISH OIL) 1000 MG CAPS Take 1,000 mg by mouth daily.    . potassium chloride SA (K-DUR,KLOR-CON) 20 MEQ tablet Take 2 tablets (40 mEq total) by mouth daily. 180 tablet 1  . ticagrelor (BRILINTA) 90 MG TABS tablet Take 1 tablet (90 mg total) by mouth 2 (two) times daily. 180 tablet 1   No current facility-administered medications for this visit.    Allergies:   Eggs or egg-derived products; Nickel; Adhesive; and Latex    Social History:  The patient  reports that she has never smoked. She has never used smokeless tobacco. She reports that she does not drink alcohol or use illicit drugs.   Family History:  The  patient's family history includes Hypertension in her father and mother; Kidney disease in her father; Stroke in her mother. There is no history of CAD.    ROS:  General:no colds or fevers, no weight changes Skin:no rashes or ulcers HEENT:no blurred vision, no congestion CV:see HPI PUL:see HPI GI:no diarrhea constipation or melena, no indigestion GU:no further hematuria, no dysuria MS:no joint pain, no claudication Neuro:no syncope, + lightheadedness see HPI Endo:no diabetes, + thyroid disease  Wt Readings from Last 3 Encounters:  10/18/15 211 lb (95.709 kg)  06/04/15 214 lb (97.07 kg)  12/01/14 198 lb 5 oz (89.954 kg)     PHYSICAL EXAM: VS:  BP 118/78 mmHg  Pulse 72  Ht 5\' 6"  (1.676 m)  Wt 211 lb (95.709 kg)  BMI 34.07 kg/m2  SpO2 98% , BMI Body mass index is 34.07 kg/(m^2). General:Pleasant affect, NAD Skin:Warm and dry, brisk capillary refill HEENT:normocephalic, sclera clear, mucus membranes moist Neck:supple, no JVD, no bruits  Heart:S1S2 RRR without murmur, gallup, rub or click Lungs:clear without rales, rhonchi, or wheezes [id number], non tender, + BS, do not palpate liver spleen or masses Ext:no lower ext edema, 2+ pedal pulses, 2+ radial pulses Neuro:alert and oriented, MAE, follows commands, + facial symmetry    EKG:  EKG is NOT ordered today.    Recent Labs: 10/25/2014: ALT 18; BUN 15; Creatinine, Ser 0.88; Potassium 3.5; Sodium 140    Lipid Panel    Component Value Date/Time   CHOL 98 10/25/2014 0756   TRIG 48.0 10/25/2014 0756   HDL 34.90* 10/25/2014 0756   CHOLHDL 3 10/25/2014 0756   VLDL 9.6 10/25/2014 0756   LDLCALC 54 10/25/2014 0756       Other studies Reviewed: Additional studies/ records that were reviewed today include:previous cath and OV. Brooke Proctor ECHO 09/2014: Study Conclusions  - Left ventricle: The cavity size was normal. Wall thickness was  increased in a pattern of mild LVH. Systolic function was normal.  The estimated ejection  fraction was in the range of 60% to 65%.  Doppler parameters are consistent with abnormal left ventricular  relaxation (grade 1 diastolic dysfunction). - Aortic valve: There was mild regurgitation. - Mitral valve: There was mild regurgitation.  ASSESSMENT AND PLAN:  1. Dizziness with climbing stairs - 2 week event monitor to eval for arrthymias  2.  CAD with PCI with NSTEMI 2 vessel PCI on Brilinta, no ASA stable.  3. HTN essential controlled  4.  Hyperlipidemia LDL<70 continue statin will check lipid and hepatic.   5. Obesity is watching her diet, has plans to  begin exercise.    Follow up with Dr. Ellyn Proctor in 4 months.   Current medicines are reviewed with the patien t today.  The patient Has no concerns regarding medicines.  The following changes have been made:  See above Labs/ tests ordered today include:see above  Disposition:   FU:  see above  Signed, Cecilie Kicks, NP  10/18/2015 6:05 PM    [facility], [place], [place], [place] Phone: [phone]; Fax: [phone]

## 2015-10-18 NOTE — Patient Instructions (Signed)
Medication Instructions:  Your physician recommends that you continue on your current medications as directed. Please refer to the Current Medication list given to you today.   Labwork: Your physician recommends that you return for a FASTING lipid profile and lft   Testing/Procedures: Your physician has recommended that you wear an event monitor. Event monitors are medical devices that record the heart's electrical activity. Doctors most often [place] these monitors to diagnose arrhythmias. Arrhythmias are problems with the speed or rhythm of the heartbeat. The monitor is a small, portable device. You can wear one while you do your normal daily activities. This is usually used to diagnose what is causing palpitations/syncope (passing out).   Follow-Up: Your physician recommends that you schedule a follow-up appointment in: 4 months with Dr.Harding   Any Other Special Instructions Will Be Listed Below (If Applicable).     If you need a refill on your cardiac medications before your next appointment, please call your pharmacy.

## 2015-10-23 ENCOUNTER — Other Ambulatory Visit (INDEPENDENT_AMBULATORY_CARE_PROVIDER_SITE_OTHER): Payer: 59

## 2015-10-23 ENCOUNTER — Ambulatory Visit (INDEPENDENT_AMBULATORY_CARE_PROVIDER_SITE_OTHER): Payer: 59

## 2015-10-23 DIAGNOSIS — R42 Dizziness and giddiness: Secondary | ICD-10-CM | POA: Diagnosis not present

## 2015-10-23 DIAGNOSIS — E785 Hyperlipidemia, unspecified: Secondary | ICD-10-CM | POA: Diagnosis not present

## 2015-10-23 LAB — HEPATIC FUNCTION PANEL
ALT: 16 U/L (ref 6–29)
AST: 19 U/L (ref 10–35)
Albumin: 3.9 g/dL (ref 3.6–5.1)
Alkaline Phosphatase: 69 U/L (ref 33–130)
BILIRUBIN DIRECT: 0.2 mg/dL (ref <=0.2)
BILIRUBIN TOTAL: 0.8 mg/dL (ref 0.2–1.2)
Indirect Bilirubin: 0.6 mg/dL (ref 0.2–1.2)
Total Protein: 6.7 g/dL (ref 6.1–8.1)

## 2015-10-23 LAB — LIPID PANEL
CHOL/HDL RATIO: 2.6 ratio (ref <=5.0)
Cholesterol: 102 mg/dL — ABNORMAL LOW (ref 125–200)
HDL: 40 mg/dL — ABNORMAL LOW (ref >=46)
LDL CALC: 50 mg/dL (ref <130)
Triglycerides: 61 mg/dL (ref <150)
VLDL: 12 mg/dL (ref <30)

## 2015-12-05 ENCOUNTER — Encounter: Payer: Self-pay | Admitting: Podiatry

## 2015-12-05 ENCOUNTER — Ambulatory Visit (HOSPITAL_BASED_OUTPATIENT_CLINIC_OR_DEPARTMENT_OTHER)
Admission: RE | Admit: 2015-12-05 | Discharge: 2015-12-05 | Disposition: A | Discharge status: Home / Self Care | Payer: 59 | Source: Ambulatory Visit | Attending: Podiatry | Admitting: Podiatry

## 2015-12-05 ENCOUNTER — Ambulatory Visit (INDEPENDENT_AMBULATORY_CARE_PROVIDER_SITE_OTHER): Payer: 59 | Admitting: Podiatry

## 2015-12-05 VITALS — BP 135/86 | HR 85 | Resp 18

## 2015-12-05 DIAGNOSIS — R52 Pain, unspecified: Secondary | ICD-10-CM

## 2015-12-05 DIAGNOSIS — M7731 Calcaneal spur, right foot: Secondary | ICD-10-CM | POA: Insufficient documentation

## 2015-12-05 DIAGNOSIS — R937 Abnormal findings on diagnostic imaging of other parts of musculoskeletal system: Secondary | ICD-10-CM | POA: Insufficient documentation

## 2015-12-05 DIAGNOSIS — M2041 Other hammer toe(s) (acquired), right foot: Secondary | ICD-10-CM

## 2015-12-05 DIAGNOSIS — Z9889 Other specified postprocedural states: Secondary | ICD-10-CM | POA: Insufficient documentation

## 2015-12-05 NOTE — Progress Notes (Signed)
   Subjective:    Patient ID: Brooke Proctor, female    DOB: February 28, 1951, 65 y.o.   MRN: [id number]  HPI  65 year old female presents the office with concerns of her right second toe drawing inwards to the other toes. This area is painful "shoe gear and pressure. She's tried offloading pads in shoe changes without any relief of symptoms. At this time should discuss surgical intervention. She says numbness to her feet as she had chemotherapy in 2010 for breast cancer. No other complaints at this time.  Review of Systems  All other systems reviewed and are negative.      Objective:   Physical Exam General: AAO x3, NAD  Dermatological: Irritation of the distal aspect of the patient's right second toe without any open lesions identified this time. There is no other open lesions present lesions.  Vascular: Dorsalis Pedis artery and Posterior Tibial artery pedal pulses are 2/4 bilateral with immedate capillary fill time. Pedal hair growth present. There is no pain with calf compression, swelling, warmth, erythema.   Neruologic: Sensation decreased with Derrel Nip monofilament.  Musculoskeletal: Flexion contracture present of the right second toe with tenderness palpation to the distal aspect of the toe as well as the dorsal aspect. There is no other areas of tenderness present bilaterally. Scar from prior surgeries of the first MPJ is healed. There is somewhat decreased range of motion the first MTPJ.  Gait: Unassisted, Nonantalgic.      Assessment & Plan:  65 year old female right second digit hammertoe contracture -Treatment options discussed including all alternatives, risks, and complications -Etiology of symptoms were discussed -X-rays ordered and reviewed with the patient today. -I discussed both conservative and surgical treatment options. I discussed with her offloading pads in shoe gear modifications which she is attended without any relief of symptoms. This time she wishes  to proceed with surgical intervention. Discussed the right second digit hammertoe correction with K wire fixation. I discussed the risks and complications and she understands this and wishes to proceed. -The incision placement as well as the postoperative course was discussed with the patient. I discussed risks of the surgery which include, but not limited to, infection, bleeding, pain, swelling, need for further surgery, delayed or nonhealing, painful or ugly scar, numbness or sensation changes, over/under correction, recurrence, transfer lesions, further deformity, hardware failure, DVT/PE, loss of toe/foot. Patient understands these risks and wishes to proceed with surgery. The surgical consent was reviewed with the patient all 3 pages were signed. No promises or guarantees were given to the outcome of the procedure. All questions were answered to the best of my ability. Before the surgery the patient was encouraged to call the office if there is any further questions. The surgery will be performed at the [facility] on an outpatient basis.  Celesta Gentile, DPM

## 2015-12-05 NOTE — Patient Instructions (Signed)

## 2015-12-19 ENCOUNTER — Other Ambulatory Visit: Payer: Self-pay | Admitting: Cardiology

## 2015-12-19 NOTE — Telephone Encounter (Signed)
Rx(s) sent to pharmacy electronically.  

## 2015-12-20 DIAGNOSIS — M2041 Other hammer toe(s) (acquired), right foot: Secondary | ICD-10-CM | POA: Insufficient documentation

## 2015-12-26 ENCOUNTER — Encounter: Payer: Self-pay | Admitting: *Deleted

## 2016-01-02 ENCOUNTER — Telehealth: Payer: Self-pay | Admitting: *Deleted

## 2016-01-02 NOTE — Telephone Encounter (Signed)
Request for surgical clearance:  1. What type of surgery is being performed? HAMMER TOE  REPAIR 2ND RIGHT FOOT UNDER A LOCAL /IV SEDATION  2. When is this surgery scheduled? TBA  3. Are there any medications that need to be held prior to surgery and how long? BRILINTA  4. Name of physician performing surgery? DR Celesta Gentile ,DPM   5. What is your office phone and fax number? FAX [phone] , PHONE [phone]

## 2016-01-02 NOTE — Telephone Encounter (Signed)
Okay to hold Brilinta for 5-7 days pre-op. restart stable postop.  Glenetta Hew, MD

## 2016-01-03 NOTE — Telephone Encounter (Signed)
ROUTED INFORMATION TO DR [facility] OFFICE

## 2016-01-08 ENCOUNTER — Telehealth: Payer: Self-pay | Admitting: *Deleted

## 2016-01-08 NOTE — Telephone Encounter (Signed)
I am calling to see if you would like to schedule surgery.  "I'm not at home right now.  Can I call you back?"  That will be fine.  We received a message from Dr. Ellyn Hack.  He wants you to stop taking the Brilinta 5-7 days before surgery date.  "I kind of figured that.  I'll give you a call back."

## 2016-01-23 NOTE — Telephone Encounter (Signed)
"  I am calling back to schedule my surgery.  I am going to have to wait until November to have my surgery.  I am in the process of moving to [place].  I rent my house out during the furniture market so I need to hold off on the surgery.  I don't want to be hobbling along trying to get things done."  He can do it any Wednesday in November.  "Let us schedule it for November 29, that way I will have everything done.  I know I am to stop taking the Brilinta 5 days before.  Thank you so much for working with me.  Dr. Blenda Mounts had given me a handicap parking permit.  Is there any way Dr. Jacqualyn Posey can extend it?  It expires in November."  I will ask him.  "I also have a change in my insurance.  I now have Medicare as my primary and I have [employer] as my secondary."  Bring the cards by the office so we can update your information.  "Can I take it by the [facility] office?  It is closer to my job."  That will be fine.  Would you rather go there for you appointments?  "Yes I would like to go there."  I will schedule your follow up appointments there at that location.

## 2016-02-11 ENCOUNTER — Encounter (HOSPITAL_COMMUNITY): Payer: Self-pay | Admitting: *Deleted

## 2016-02-11 ENCOUNTER — Ambulatory Visit (HOSPITAL_COMMUNITY)
Admission: EM | Admit: 2016-02-11 | Discharge: 2016-02-11 | Disposition: A | Discharge status: Home / Self Care | Payer: 59 | Attending: Internal Medicine | Admitting: Internal Medicine

## 2016-02-11 DIAGNOSIS — T148XXA Other injury of unspecified body region, initial encounter: Secondary | ICD-10-CM | POA: Diagnosis not present

## 2016-02-11 NOTE — ED Triage Notes (Addendum)
Patient woke up this am with petechiae to left arm. Starts mid upper arm with large area noted (almost appears like bruise) then spreads down into forearm. 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

## 2016-02-11 NOTE — Discharge Instructions (Signed)
Apply moist heat to arm.  If symptoms worsen call PCP or go to the ER.   Keep elevated

## 2016-02-12 NOTE — ED Provider Notes (Signed)
CSN: [id number]     Arrival date & time 02/11/16  1856 History   First MD Initiated Contact with Patient 02/11/16 2029     Chief Complaint  Patient presents with  . Bleeding/Bruising   (Consider location/radiation/quality/duration/timing/severity/associated sxs/prior Treatment) HPI This is a new problem Pt is a 65 y/o female with history of breast CA and left lymphedema. Awoke this morning with superficial contusion to the left arm. No known trauma, no pain, no change with home treatment.no previous symptoms of this nature.  Past Medical History:  Diagnosis Date  . Breast cancer ([place])   . CAD S/P percutaneous coronary angioplasty    a. NSTEMI 09/2014: s/p DES to mLAD and mRCA. Normal EF.  . Essential hypertension   . History of radiation therapy   . Hyperlipidemia   . Mild valvular heart disease    a. Echo 09/2014: mild AI/MR.  . NSTEMI (non-ST elevated myocardial infarction) ([place]) 09/19/2014  . Obesity   . Status post chemotherapy   . Thyroid disease    Past Surgical History:  Procedure Laterality Date  . CARDIAC CATHETERIZATION N/A 09/20/2014   Procedure: Left Heart Cath and Coronary Angiography;  Surgeon: Peter M Martinique, MD;  Location: [facility];  Service: Cardiovascular; 80% mLAD, 90% mRCA  . CARDIAC CATHETERIZATION  09/20/2014   Procedure: Coronary Stent Intervention;  Surgeon: Peter M Martinique, MD;  Location: [facility];  Service: Cardiovascular;;mLAD Promus Premeir DES 2.5 mm x 16 mm , mRCA - Promus Premier DES 4.0 mm x 16 mm (4.5 mm)   . LYMPH NODE DISSECTION    . TRANSTHORACIC ECHOCARDIOGRAM  09/21/2014   Normal LV Size, mild LVH.  EF 60-65%, Gr 1 DD, mild AI & MR   Family History  Problem Relation Age of Onset  . Hypertension Mother   . Stroke Mother   . Hypertension Father   . Kidney disease Father   . CAD Neg Hx    Social History  Substance Use Topics  . Smoking status: Never Smoker  . Smokeless tobacco: Never Used  . Alcohol use No   OB History     No data available     Review of Systems  Denies: HEADACHE, NAUSEA, ABDOMINAL PAIN, CHEST PAIN, CONGESTION, DYSURIA, SHORTNESS OF BREATH  Allergies  Eggs or egg-derived products; Nickel; Adhesive [tape]; and Latex  Home Medications   Prior to Admission medications   Medication Sig Start Date End Date Taking? Authorizing Provider  atorvastatin (LIPITOR) 40 MG tablet Take 1 tablet (40 mg total) by mouth at bedtime. 06/04/15  Yes Leonie Man, MD  benazepril (LOTENSIN) 20 MG tablet Take 1 tablet (20 mg total) by mouth daily. 06/04/15  Yes Leonie Man, MD  ergocalciferol (VITAMIN D2) 50000 UNITS capsule Take 50,000 Units by mouth once a week. Take on sundays   Yes Historical Provider, MD  hydrochlorothiazide (HYDRODIURIL) 25 MG tablet Take 1 tablet (25 mg total) by mouth daily. 06/04/15  Yes Leonie Man, MD  levothyroxine (SYNTHROID, LEVOTHROID) 100 MCG tablet Take 100 mcg by mouth daily before breakfast.   Yes Greta O'Buch, PA-C  metoprolol succinate (TOPROL-XL) 25 MG 24 hr tablet Take 1 tablet (25 mg total) by mouth daily. 10/18/15  Yes Isaiah Serge, NP  ticagrelor (BRILINTA) 90 MG TABS tablet Take 1 tablet (90 mg total) by mouth 2 (two) times daily. 10/18/15  Yes Isaiah Serge, NP  acetaminophen (TYLENOL) 325 MG tablet Take 2 tablets (650 mg total) by mouth  every 4 (four) hours as needed for headache or mild pain. 09/21/14   Erlene Quan, PA-C  Biotin 1000 MCG tablet Take 1,000 mcg by mouth daily.    Historical Provider, MD  fluticasone (FLONASE) 50 MCG/ACT nasal spray Place 2 sprays into both nostrils daily. 09/12/14   Historical Provider, MD  guaiFENesin 200 MG tablet Take 200 mg by mouth every 4 (four) hours as needed for cough or to loosen phlegm.    Historical Provider, MD  meclizine (ANTIVERT) 12.5 MG tablet Take 12.5 mg by mouth 3 (three) times daily as needed for dizziness.    Historical Provider, MD  nitroGLYCERIN (NITROSTAT) 0.4 MG SL tablet PLACE 1 TABLET UNDER THE TONGUE  EVERY 5 MINUTES FOR 3 DOSES AS NEEDED FOR CHEST PAIN 09/27/15   Leonie Man, MD  Omega-3 Fatty Acids (FISH OIL) 1000 MG CAPS Take 1,000 mg by mouth daily.    Historical Provider, MD  potassium chloride SA (K-DUR,KLOR-CON) 20 MEQ tablet TAKE 2 TABLETS BY MOUTH DAILY 12/19/15   Leonie Man, MD   Meds Ordered and Administered this Visit  Medications - No data to display  BP 135/81 (BP Location: Left Arm)   Pulse 73   Temp 98.2 F (36.8 C) (Oral)   Resp 16   SpO2 99%  No data found.   Physical Exam NURSES NOTES AND VITAL SIGNS REVIEWED. CONSTITUTIONAL: Well developed, well nourished, no acute distress HEENT: normocephalic, atraumatic EYES: Conjunctiva normal NECK:normal ROM, supple, no adenopathy PULMONARY:No respiratory distress, normal effort ABDOMINAL: Soft, ND, NT BS+, No CVAT MUSCULOSKELETAL: Normal ROM of all extremities, Left arm small areas of petechiae. No heat. Non tender, no sign of cellulitis.  SKIN: warm and dry without rash PSYCHIATRIC: Mood and affect, behavior are normal  Urgent Care Course   Clinical Course    Procedures (including critical care time)  Labs Review Labs Reviewed - No data to display  Imaging Review No results found.   Visual Acuity Review  Right Eye Distance:   Left Eye Distance:   Bilateral Distance:    Right Eye Near:   Left Eye Near:    Bilateral Near:        Pt is advised to treat symptoms. With heat, ibuprofen.  Follow up with PCP the next day for follow up if not getting better  1. Superficial bruising     Patient is reassured that there are no issues that require transfer to higher level of care at this time or additional tests. Patient is advised to continue home symptomatic treatment. Patient is advised that if there are new or worsening symptoms to attend the emergency department, contact primary care provider, or return to UC. Instructions of care provided discharged home in stable condition.    THIS NOTE  WAS GENERATED USING A VOICE RECOGNITION SOFTWARE PROGRAM. ALL REASONABLE EFFORTS  WERE MADE TO PROOFREAD THIS DOCUMENT FOR ACCURACY.  I have verbally reviewed the discharge instructions with the patient. A printed AVS was given to the patient.  All questions were answered prior to discharge.      Konrad Felix, [place] 02/12/16 1352

## 2016-03-10 ENCOUNTER — Encounter: Payer: Self-pay | Admitting: Cardiology

## 2016-03-10 ENCOUNTER — Ambulatory Visit (INDEPENDENT_AMBULATORY_CARE_PROVIDER_SITE_OTHER): Payer: Medicare Other | Admitting: Cardiology

## 2016-03-10 VITALS — BP 126/84 | HR 72 | Ht 66.0 in | Wt 213.0 lb

## 2016-03-10 DIAGNOSIS — I252 Old myocardial infarction: Secondary | ICD-10-CM | POA: Diagnosis not present

## 2016-03-10 DIAGNOSIS — E876 Hypokalemia: Secondary | ICD-10-CM | POA: Diagnosis not present

## 2016-03-10 DIAGNOSIS — I38 Endocarditis, valve unspecified: Secondary | ICD-10-CM

## 2016-03-10 DIAGNOSIS — I2510 Atherosclerotic heart disease of native coronary artery without angina pectoris: Secondary | ICD-10-CM

## 2016-03-10 DIAGNOSIS — Z9861 Coronary angioplasty status: Secondary | ICD-10-CM

## 2016-03-10 DIAGNOSIS — I10 Essential (primary) hypertension: Secondary | ICD-10-CM | POA: Diagnosis not present

## 2016-03-10 DIAGNOSIS — E785 Hyperlipidemia, unspecified: Secondary | ICD-10-CM

## 2016-03-10 MED ORDER — TICAGRELOR 60 MG PO TABS: 60.0000 mg | ORAL_TABLET | Freq: Two times a day (BID) | ORAL | 3 refills | Status: DC

## 2016-03-10 NOTE — Progress Notes (Signed)
PCP: Nicola Girt, DO  Clinic Note: Chief Complaint  Patient presents with  . Follow-up  . Coronary Artery Disease    History of non-STEMI May 2016 LAD and RCA PCI    HPI: Brooke Proctor is a 65 y.o. female with a PMH below who presents today for Roughly 5 month follow-up for CAD-PCI. She had a history of a non-STEMI in May 2016 and had it PCI done to both LAD and RCA. I last saw her in late 06/04/1615., She is doing pretty well at that time just had some occasional mild chest discomfort.  Brooke Proctor was last seen on June 15 by Cecilie Kicks, NP with complaint of short of breath climbing stairs being dizzy. She had a 2 week event monitor that essentially revealed sinus rhythm with sinus tachycardia but no arrhythmias.  Recent Hospitalizations: none  Studies Reviewed: Monitor reviewed  Interval History: Brooke Proctor presents today overall doing quite well from a cardiac standpoint.   No chest pain or shortness of breath with rest or exertion. No PND, orthopnea or edema. No palpitations, lightheadedness, dizziness, weakness or syncope/near syncope. No TIA/amaurosis fugax symptoms. No claudication.   ROS: A comprehensive was performed. Review of Systems  Constitutional: Negative for malaise/fatigue and weight loss.  HENT: Negative for congestion and nosebleeds.   Eyes: Negative.   Respiratory: Negative for cough, shortness of breath and wheezing.   Cardiovascular: Negative.        Per history of present illness  Gastrointestinal: Negative for blood in stool and melena.  Genitourinary: Negative for hematuria.  Musculoskeletal: Negative.        No notable cramping  Skin: Negative.   Neurological: Positive for dizziness (Vertigo.).  Psychiatric/Behavioral: Negative.     Past Medical History:  Diagnosis Date  . Breast cancer ([place])   . CAD S/P percutaneous coronary angioplasty    a. NSTEMI 09/2014: s/p DES to mLAD and mRCA. Normal EF.  . Essential hypertension   .  History of radiation therapy   . Hyperlipidemia   . Mild valvular heart disease    a. Echo 09/2014: mild AI/MR.  . NSTEMI (non-ST elevated myocardial infarction) ([place]) 09/19/2014  . Obesity   . Status post chemotherapy   . Thyroid disease     Past Surgical History:  Procedure Laterality Date  . CARDIAC CATHETERIZATION N/A 09/20/2014   Procedure: Left Heart Cath and Coronary Angiography;  Surgeon: Peter M Martinique, MD;  Location: [facility];  Service: Cardiovascular; 80% mLAD, 90% mRCA  . CARDIAC CATHETERIZATION  09/20/2014   Procedure: Coronary Stent Intervention;  Surgeon: Peter M Martinique, MD;  Location: [facility];  Service: Cardiovascular;;mLAD Promus Premeir DES 2.5 mm x 16 mm , mRCA - Promus Premier DES 4.0 mm x 16 mm (4.5 mm)   . LYMPH NODE DISSECTION    . TRANSTHORACIC ECHOCARDIOGRAM  09/21/2014   Normal LV Size, mild LVH.  EF 60-65%, Gr 1 DD, mild AI & MR   Diagnostic Diagram       Post-Intervention Diagram         LAD: Promus DES 2.5 x 16 (~2.6 mm); RCA Promus DES 4.0 x 16 (4.6 mm)  Prior to Admission medications   Medication Sig Start Date Taking? Authorizing Provider  acetaminophen (TYLENOL) 325 MG tablet Take 2 tablets (650 mg total) by mouth every 4 (four) hours as needed for headache or mild pain. 09/21/14 Yes Luke K Kilroy, PA-C  atorvastatin (LIPITOR) 40 MG tablet Take 1  tablet (40 mg total) by mouth at bedtime. 06/04/15 Yes Leonie Man, MD  benazepril (LOTENSIN) 20 MG tablet Take 1 tablet (20 mg total) by mouth daily. 06/04/15 Yes Leonie Man, MD  Biotin 1000 MCG tablet Take 1,000 mcg by mouth daily.  Yes Historical Provider, MD  ergocalciferol (VITAMIN D2) 50000 UNITS capsule Take 50,000 Units by mouth once a week. Take on sundays  Yes Historical Provider, MD  fluticasone (FLONASE) 50 MCG/ACT nasal spray Place 2 sprays into both nostrils daily. 09/12/14 Yes Historical Provider, MD  guaiFENesin 200 MG tablet Take 200 mg by mouth every 4 (four) hours as  needed for cough or to loosen phlegm.  Yes Historical Provider, MD  hydrochlorothiazide (HYDRODIURIL) 25 MG tablet Take 1 tablet (25 mg total) by mouth daily. 06/04/15 Yes Leonie Man, MD  levothyroxine (SYNTHROID, LEVOTHROID) 100 MCG tablet Take 100 mcg by mouth daily before breakfast.  Yes Greta O'Buch, PA-C  meclizine (ANTIVERT) 12.5 MG tablet Take 12.5 mg by mouth 3 (three) times daily as needed for dizziness.  Yes Historical Provider, MD  metoprolol succinate (TOPROL-XL) 25 MG 24 hr tablet Take 1 tablet (25 mg total) by mouth daily. 10/18/15 Yes Isaiah Serge, NP  nitroGLYCERIN (NITROSTAT) 0.4 MG SL tablet PLACE 1 TABLET UNDER THE TONGUE EVERY 5 MINUTES FOR 3 DOSES AS NEEDED FOR CHEST PAIN 09/27/15 Yes Leonie Man, MD  potassium chloride SA (K-DUR,KLOR-CON) 20 MEQ tablet TAKE 2 TABLETS BY MOUTH DAILY 12/19/15 Yes Leonie Man, MD  ticagrelor (BRILINTA) 90 MG TABS tablet Take 1 tablet (90 mg total) by mouth 2 (two) times daily. 10/18/15 Yes Isaiah Serge, NP    Allergies  Allergen Reactions  . Eggs Or Egg-Derived Products Swelling    ALLERGY: EGG WHITES  . Nickel Swelling and Other (See Comments)    REACTION:"BLISTERS"  . Adhesive [Tape] Itching  . Latex Itching    Social History   Social History  . Marital status: Divorced    Spouse name: N/A  . Number of children: N/A  . Years of education: N/A   Social History Main Topics  . Smoking status: Never Smoker  . Smokeless tobacco: Never Used  . Alcohol use No  . Drug use: No  . Sexual activity: Not Asked   Other Topics Concern  . None   Social History Narrative  . None   family history includes Hypertension in her father and mother; Kidney disease in her father; Stroke in her mother.   Wt Readings from Last 3 Encounters:  03/10/16 96.6 kg (213 lb)  10/18/15 95.7 kg (211 lb)  06/04/15 97.1 kg (214 lb)    PHYSICAL EXAM BP 126/84   Pulse 72   Ht 5\' 6"  (1.676 m)   Wt 96.6 kg (213 lb)   BMI 34.38 kg/m    General appearance: alert, cooperative, appears stated age, no distress and mildly obese; normal mood and affect. Neck: no adenopathy, no carotid bruit and no JVD Lungs: clear to auscultation bilaterally, normal percussion bilaterally and non-labored Heart: regular rate and rhythm, S1 & S2 normal, no murmur, click, rub or gallop ; nondisplaced PMI Abdomen: soft, non-tender; bowel sounds normal; no masses, no organomegaly; no HJR Extremities: extremities normal, atraumatic, no cyanosis, and edema Pulses: 2+ and symmetric;  Skin: normal Neurologic: Mental status: Alert, oriented, thought content appropriate Cranial nerves: normal (II-XII grossly intact)    Adult ECG Report  Rate: 72 ;  Rhythm: normal sinus rhythm and LAFB (-53) ~incomplete  RBBB.  Otherwise normal intervals, durations and voltage.;   Narrative Interpretation: Relatively stable compared to last EKG. Axis is slightly more leftward and conduction abnormality more consistent with incomplete right bundle branch block.   Other studies Reviewed: Additional studies/ records that were reviewed today include:  Recent Labs:   Lab Results  Component Value Date   CHOL 102 (L) 10/23/2015   HDL 40 (L) 10/23/2015   LDLCALC 50 10/23/2015   TRIG 61 10/23/2015   CHOLHDL 2.6 10/23/2015   Lab Results  Component Value Date   K 3.5 10/25/2014    ASSESSMENT / PLAN: Problem List Items Addressed This Visit    History of non-STEMI (Chronic)   CAD S/P percutaneous coronary angioplasty - Primary (Chronic)    Over 1-1/2 year  following 2 vessel PCI in the setting of non-STEMI. No recurrent anginal symptoms.  Continues to be on Brilinta without aspirin, still notes bruising. --> Plan will be to reduce her to 60 mg BID -- In the interim, we will have her take her 90 mg tablets only once daily on Tuesdays, Thursdays and Saturdays. Otherwise take twice a day.  On beta blocker, ACE inhibitor and statin with no other issues.  She would like  to try to do maintenance Cardiac Rehabilitation, but switch to [facility]. I think this is a good idea. She will think about doing so. Would be happy to put in the referral.      Relevant Orders   EKG 12-Lead (Completed)   Basic metabolic panel   Hyperlipidemia with target LDL less than 70 (Chronic)    Repeat labs from June look great. Totally within goal. Would be due to recheck next June. If that looks to be the same, would probably 1 to order more definitive panel to confirm that we are truly at goal. Continue current dose of atorvastatin      Hypokalemia (Chronic)    She has been on replacement potassium for quite a long time. This may be related to HCTZ. We'll check a BMP level - depending on what the potassium looks like, she'll be able to back off on dosing. She is currently taking 2 tablets daily.  Last potassium was 3.5 in June.      Relevant Orders   EKG 12-Lead (Completed)   Basic metabolic panel   Essential hypertension (Chronic)    Well-controlled blood pressure today on current medications. Continue current dose of ACE inhibitor and beta blocker. She feels like her edema has improved since splitting the benazepril and HCTZ from the combined drug to individual drug.      Mild valvular heart disease    Mild MR noted on echo, but not heard on exam.      Relevant Orders   EKG 12-Lead (Completed)   Basic metabolic panel      Current medicines are reviewed at length with the patient today. (+/- concerns) still has bruising The following changes have been made: see below  Patient Instructions   CHANGE IN MEDICATIONS  SWITCH TO BRILINTA 60 MG ONE TABLET TWICE A DAY   COMPLETE THE BRILINTA 90 TABLET FIRST, TAKE ONE TABLET OF  BRILINTA 90 MG Tuesday,THURSDAY,SATURDAY -THE OTHER DAYS TAKE TWICE A DAY.  YOU MAY HOLD BRILINTAFOR 2-3 DAYS IF SEVERE BRUISING OCCURS  LABS-- BMP --CHECK POTASSIUM LEVEL   Your physician wants you to follow-up in: SEPT 2018 [place]. You will receive a reminder letter in the mail two months in advance. If you don't  receive a letter, please call our office to schedule the follow-up appointment.  If you need a refill on your cardiac medications before your next appointment, please call your pharmacy.   Studies Ordered:   Orders Placed This Encounter  Procedures  . Basic metabolic panel  . EKG 12-Lead      Glenetta Hew, M.D., M.S. Interventional Cardiologist   Pager # [phone] Phone # [phone] [address] [place], [place] [address]

## 2016-03-10 NOTE — Patient Instructions (Addendum)
CHANGE IN MEDICATIONS  SWITCH TO BRILINTA 60 MG ONE TABLET TWICE A DAY   COMPLETE THE BRILINTA 90 TABLET FIRST, TAKE ONE TABLET OF  BRILINTA 90 MG Tuesday,THURSDAY,SATURDAY -THE OTHER DAYS TAKE TWICE A DAY.  YOU MAY HOLD BRILINTAFOR 2-3 DAYS IF SEVERE BRUISING OCCURS  LABS-- BMP --CHECK POTASSIUM LEVEL   Your physician wants you to follow-up in: SEPT 2018 [place]. You will receive a reminder letter in the mail two months in advance. If you don't receive a letter, please call our office to schedule the follow-up appointment.  If you need a refill on your cardiac medications before your next appointment, please call your pharmacy.

## 2016-03-12 ENCOUNTER — Encounter: Payer: Self-pay | Admitting: Cardiology

## 2016-03-12 NOTE — Assessment & Plan Note (Signed)
Over 1-1/2 year  following 2 vessel PCI in the setting of non-STEMI. No recurrent anginal symptoms.  Continues to be on Brilinta without aspirin, still notes bruising. --> Plan will be to reduce her to 60 mg BID -- In the interim, we will have her take her 90 mg tablets only once daily on Tuesdays, Thursdays and Saturdays. Otherwise take twice a day.  On beta blocker, ACE inhibitor and statin with no other issues.  She would like to try to do maintenance Cardiac Rehabilitation, but switch to [facility]. I think this is a good idea. She will think about doing so. Would be happy to put in the referral.

## 2016-03-12 NOTE — Assessment & Plan Note (Signed)
Mild MR noted on echo, but not heard on exam.

## 2016-03-12 NOTE — Assessment & Plan Note (Addendum)
She has been on replacement potassium for quite a long time. This may be related to HCTZ. We'll check a BMP level - depending on what the potassium looks like, she'll be able to back off on dosing. She is currently taking 2 tablets daily.  Last potassium was 3.5 in June.

## 2016-03-12 NOTE — Assessment & Plan Note (Signed)
Repeat labs from June look great. Totally within goal. Would be due to recheck next June. If that looks to be the same, would probably 1 to order more definitive panel to confirm that we are truly at goal. Continue current dose of atorvastatin

## 2016-03-12 NOTE — Assessment & Plan Note (Signed)
Well-controlled blood pressure today on current medications. Continue current dose of ACE inhibitor and beta blocker. She feels like her edema has improved since splitting the benazepril and HCTZ from the combined drug to individual drug.

## 2016-03-24 ENCOUNTER — Telehealth: Payer: Self-pay | Admitting: *Deleted

## 2016-03-24 NOTE — Telephone Encounter (Signed)
"  I need to reschedule my surgery.  I forgot all about it.  I can't do it right now.  I am in the process of moving.  I want to do it the second week of January."  Okay, I will get it rescheduled and let Dr. Jacqualyn Posey know.  I called and informed Caren Griffins at [facility] to reschedule surgery from 04/02/2016 to 05/14/2016.

## 2016-04-07 ENCOUNTER — Telehealth: Payer: Self-pay | Admitting: *Deleted

## 2016-04-07 NOTE — Telephone Encounter (Signed)
"  You just called me."  I didn't call.  "Someone just called me."  It may have been about your appointment you are scheduled for tomorrow.  I need to cancel that appointment.  I am sorry.  "I still need to have it done because my foot is killing me.  I will call back later to reschedule."

## 2016-04-08 ENCOUNTER — Encounter: Payer: Medicare Other | Admitting: Podiatry

## 2016-04-15 ENCOUNTER — Encounter: Payer: Medicare Other | Admitting: Podiatry

## 2016-05-08 ENCOUNTER — Telehealth: Payer: Self-pay | Admitting: *Deleted

## 2016-05-08 NOTE — Telephone Encounter (Signed)
"  I got a call from you yesterday about a surgery next week."  I did not call.  "Well someone did.  I had called and canceled that surgery with you.  I just moved into a new house so I want to hold off for now.  I will call and make sure it is canceled at the surgical center.  I called and canceled surgery with Caren Griffins at [facility].

## 2016-05-23 ENCOUNTER — Encounter: Payer: 59 | Admitting: Podiatry

## 2016-05-30 ENCOUNTER — Encounter: Payer: 59 | Admitting: Podiatry

## 2016-06-27 ENCOUNTER — Other Ambulatory Visit: Payer: Self-pay | Admitting: Cardiology

## 2016-07-11 ENCOUNTER — Other Ambulatory Visit: Payer: Self-pay | Admitting: Cardiology

## 2016-07-23 ENCOUNTER — Other Ambulatory Visit: Payer: Self-pay | Admitting: Pharmacist Clinician (PhC)/ Clinical Pharmacy Specialist

## 2016-07-23 MED ORDER — TICAGRELOR 60 MG PO TABS: 60.0000 mg | ORAL_TABLET | Freq: Two times a day (BID) | ORAL | 3 refills | Status: DC

## 2016-08-11 ENCOUNTER — Telehealth: Payer: Self-pay | Admitting: *Deleted

## 2016-08-11 NOTE — Telephone Encounter (Signed)
"  I'd like to schedule a time to have surgery on my foot.  Please give me a call back."

## 2016-08-13 NOTE — Telephone Encounter (Signed)
I attempted to return patient's call.  I left her messages to give me a call.  I was returning her call to schedule surgery.   "I am returning your call."  I was returning your call regarding scheduling surgery.  I already talked to Dr. Leigh Aurora nurse.  She said I needed to come in to see Dr. Jacqualyn Posey.  I have an appointment scheduled for 08/19/2016."

## 2016-08-19 ENCOUNTER — Ambulatory Visit (INDEPENDENT_AMBULATORY_CARE_PROVIDER_SITE_OTHER): Payer: Medicare Other | Admitting: Podiatry

## 2016-08-19 DIAGNOSIS — L600 Ingrowing nail: Secondary | ICD-10-CM

## 2016-08-19 DIAGNOSIS — M2041 Other hammer toe(s) (acquired), right foot: Secondary | ICD-10-CM

## 2016-08-19 NOTE — Patient Instructions (Signed)

## 2016-08-20 NOTE — Progress Notes (Signed)
Subjective: Brooke Proctor the office today for concerns of continued pain to her right second digit hammertoe. She states that she had to cancel her surgery last year due to work concerns. She states the toe continues to hurt on a daily basis. She has continued with offloading, padding as well as shoe changes without any improvement. She wishes to proceed with surgical intervention. She also states that she has an ingrown toenail on the right big toe pointed to the medial nail border. Areas painful with pressure in shoes and she is asking this nail corner can be removed at the same time as a hammertoe surgery. She denies a Denies any systemic complaints such as fevers, chills, nausea, vomiting. No acute changes since last appointment, and no other complaints at this time.   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 There is irritation on the dorsal aspect of the PIPJ from irritation in shoes. There is no skin breakdown. There is incurvation along the medial aspect of the right hallux toenail with tenderness to palpation. This no edema, erythema, drainage or pus or any clinical signs of infection. No edema, erythema, increase in warmth to bilateral lower extremities.  No open lesions or pre-ulcerative lesions.  No pain with calf compression, swelling, warmth, erythema  Assessment: Ingrown toenail right medial hallux, rind digit hammertoe  Plan: -All treatment options discussed with the patient including all alternatives, risks, complications.  -At this time she is exhausted conservative treatment she is requesting surgical I Discussed with her right second digit hammertoe with either screw or wire fixation as chemical matrixectomy to the medial right hallux toenail. -The incision placement as well as the postoperative course was discussed with  the patient. I discussed risks of the surgery which include, but not limited to, infection, bleeding, pain, swelling, need for further surgery, delayed or nonhealing, painful or ugly scar, numbness or sensation changes, over/under correction, recurrence, transfer lesions, further deformity, hardware failure, DVT/PE, loss of toe/foot. Patient understands these risks and wishes to proceed with surgery. The surgical consent was reviewed with the patient all 3 pages were signed. No promises or guarantees were given to the outcome of the procedure. All questions were answered to the best of my ability. Before the surgery the patient was encouraged to call the office if there is any further questions. The surgery will be performed at the [facility] on an outpatient basis. -Surgical shoe dispensed.  -Patient encouraged to call the office with any questions, concerns, change in symptoms.   Celesta Gentile, DPM

## 2016-08-21 ENCOUNTER — Telehealth: Payer: Self-pay | Admitting: *Deleted

## 2016-08-21 NOTE — Telephone Encounter (Signed)
"  I'm am following up with you about my surgery that is supposed to be scheduled for 08/27/2016."  I have you scheduled.  "Where are they located, [place] or [place]?"  Neither one is correct.  They are located at [address] [place] in the [place] area.  "What time do I need to be there?"  Someone from the surgical center will call you a day or two prior to the surgery date with the arrival time.  You will need to register with the surgical center, instructions are in the surgical center brochure that was given to you.  "Is it possible for me to get another brochure sent to me?"  I will put one in the mail for you.

## 2016-08-21 NOTE — Telephone Encounter (Addendum)
I left patient a message to give me a call back.  I need to let her know to stop taking the Brilinta per Dr. Ellyn Hack.  He suggested she stop taking it 5- 7 days prior to surgery date.  "I'm returning your call."  I was calling to let you know that Dr. Ellyn Hack had told us previously that you would need to stop the Brilinta 5-7 days prior to surgery date.  "I guess I better stop today."  You can stop today or tomorrow.  "Okay, thank you."

## 2016-08-22 ENCOUNTER — Telehealth: Payer: Self-pay | Admitting: *Deleted

## 2016-08-22 NOTE — Telephone Encounter (Signed)
"  I need to know if patient has Medicare Part A and B as primary or secondary."  She has Medicare as her secondary.  "That's what I needed to know.  While I have you on the phone, I'll go ahead and advise you that her surgery has been approved, just use the reference code that was given to you as the authorization number."  Authorization number is [id number].

## 2016-08-27 ENCOUNTER — Encounter: Payer: Self-pay | Admitting: Podiatry

## 2016-08-27 DIAGNOSIS — L600 Ingrowing nail: Secondary | ICD-10-CM | POA: Diagnosis not present

## 2016-08-27 DIAGNOSIS — M2041 Other hammer toe(s) (acquired), right foot: Secondary | ICD-10-CM | POA: Diagnosis not present

## 2016-09-01 ENCOUNTER — Ambulatory Visit (INDEPENDENT_AMBULATORY_CARE_PROVIDER_SITE_OTHER): Payer: 59 | Admitting: Podiatry

## 2016-09-01 ENCOUNTER — Other Ambulatory Visit: Payer: Self-pay | Admitting: Podiatry

## 2016-09-01 ENCOUNTER — Ambulatory Visit (INDEPENDENT_AMBULATORY_CARE_PROVIDER_SITE_OTHER): Payer: Medicare Other

## 2016-09-01 ENCOUNTER — Encounter: Payer: Self-pay | Admitting: Podiatry

## 2016-09-01 VITALS — BP 146/100 | HR 75

## 2016-09-01 DIAGNOSIS — M2041 Other hammer toe(s) (acquired), right foot: Secondary | ICD-10-CM

## 2016-09-01 DIAGNOSIS — L600 Ingrowing nail: Secondary | ICD-10-CM

## 2016-09-02 ENCOUNTER — Encounter: Payer: 59 | Admitting: Podiatry

## 2016-09-02 NOTE — Progress Notes (Signed)
Subjective: Brooke Proctor is a 66 y.o. is seen today in office s/p right 2nd hammertoe repair with screw fixation and partial nail avulsion with chemical matrixectomy  preformed on 08/27/2016. They state their pain is much improved and she is only having minimal pain and not taking any pain medication. Denies any systemic complaints such as fevers, chills, nausea, vomiting. No calf pain, chest pain, shortness of breath.   Objective: General: No acute distress, AAOx3  DP/PT pulses palpable 2/4, CRT < 3 sec to all digits.  Protective sensation intact. Motor function intact.  Right foot: Incision is well coapted without any evidence of dehiscence and suture are intact. There is no surrounding erythema, ascending cellulitis, fluctuance, crepitus, malodor, drainage/purulence. There is minimal edema around the surgical site. There is mild pain along the surgical site. The toe sits in a rectus position. The partial nail avulsion site is healing well and there is no surrounding erythema, edema or clinical signs of infection. No other areas of tenderness to bilateral lower extremities.  No other open lesions or pre-ulcerative lesions.  No pain with calf compression, swelling, warmth, erythema.   Assessment and Plan:  Status post right foot surgery, doing well with no complications   -Treatment options discussed including all alternatives, risks, and complications -X-rays were obtained and reviewed with the patient. Hardware intact. No evidence of acute fracture.  -Antibiotic ointment and a bandage was applied of the second toe. I wrapped the hallux separately so she can change the bandage daily. -Remain in surgical shoe.  -Ice/elevation -Pain medication as needed. -Monitor for any clinical signs or symptoms of infection and DVT/PE and directed to call the office immediately should any occur or go to the ER. -Follow-up in 10 days for POSSIBLE suture removal or sooner if any problems arise. In the  meantime, encouraged to call the office with any questions, concerns, change in symptoms.   Celesta Gentile, DPM

## 2016-09-08 ENCOUNTER — Ambulatory Visit (INDEPENDENT_AMBULATORY_CARE_PROVIDER_SITE_OTHER): Payer: Self-pay | Admitting: Podiatry

## 2016-09-08 ENCOUNTER — Encounter: Payer: Self-pay | Admitting: Podiatry

## 2016-09-08 ENCOUNTER — Ambulatory Visit: Payer: 59

## 2016-09-08 VITALS — BP 132/83 | HR 78

## 2016-09-08 DIAGNOSIS — M2041 Other hammer toe(s) (acquired), right foot: Secondary | ICD-10-CM

## 2016-09-08 DIAGNOSIS — L600 Ingrowing nail: Secondary | ICD-10-CM

## 2016-09-08 DIAGNOSIS — Z9889 Other specified postprocedural states: Secondary | ICD-10-CM

## 2016-09-08 NOTE — Progress Notes (Signed)
Subjective: Brooke Proctor is a 66 y.o. is seen today in office s/p right 2nd hammertoe repair with screw fixation and partial nail avulsion with chemical matrixectomy  preformed on 08/27/2016. They state their pain is much improved and she is only having minimal pain and not taking any pain medication. She gets some pain to the right big toe still.  Denies any systemic complaints such as fevers, chills, nausea, vomiting. No calf pain, chest pain, shortness of breath.   Objective: General: No acute distress, AAOx3  DP/PT pulses palpable 2/4, CRT < 3 sec to all digits.  Protective sensation intact. Motor function intact.  Right foot: Incision is well coapted without any evidence of dehiscence and suture are intact. There is no surrounding erythema, ascending cellulitis, fluctuance, crepitus, malodor, drainage/purulence. There is mild edema around the surgical site. There is minimal pain along the surgical site. The toe sits in a rectus position. The partial nail avulsion site is healing well and there is no surrounding erythema, edema or clinical signs of infection. Mild discomfort along the distal medial aspect of the nail border. No other areas of tenderness to bilateral lower extremities.  No other open lesions or pre-ulcerative lesions.  No pain with calf compression, swelling, warmth, erythema.   Assessment and Plan:  Status post right foot surgery, doing well with no complications   -Treatment options discussed including all alternatives, risks, and complications -Sutures removed today without complications the right second toe. I showed her how to tape the toe to help with swelling. Continue antibiotic ointment dressing changes of this. -In regards to the hallux toenail continue with cleaning the area. On Wednesday she can start to soak in Epsom salts as well as incision of the second toe is doing well. She can leave the area uncovered at night to keep antibiotic ointment and a band  syndrome the day. -Remaining surgical shoe. -Ice and elevation -Monitor for any clinical signs or symptoms of infection and DVT/PE and directed to call the office immediately should any occur or go to the ER. -Follow-up in 2 weeks or sooner if any problems arise. In the meantime, encouraged to call the office with any questions, concerns, change in symptoms.   *x-ray next appointment   Celesta Gentile, DPM

## 2016-09-12 ENCOUNTER — Ambulatory Visit: Payer: 59

## 2016-09-23 NOTE — Progress Notes (Signed)
1. Right 2nd digit hammertoe repair with screw/wire fixation. 2.  Ingrown toenail removal right big toe (medial) with permanent removal at corner.

## 2016-09-25 ENCOUNTER — Ambulatory Visit (INDEPENDENT_AMBULATORY_CARE_PROVIDER_SITE_OTHER): Payer: Medicare Other

## 2016-09-25 ENCOUNTER — Encounter: Payer: Self-pay | Admitting: Podiatry

## 2016-09-25 ENCOUNTER — Ambulatory Visit (INDEPENDENT_AMBULATORY_CARE_PROVIDER_SITE_OTHER): Payer: Self-pay | Admitting: Podiatry

## 2016-09-25 DIAGNOSIS — M2041 Other hammer toe(s) (acquired), right foot: Secondary | ICD-10-CM

## 2016-09-25 DIAGNOSIS — Z9889 Other specified postprocedural states: Secondary | ICD-10-CM

## 2016-09-26 NOTE — Progress Notes (Signed)
Subjective: Brooke Proctor is a 66 y.o. is seen today in office s/p right 2nd hammertoe repair with screw fixation and partial nail avulsion with chemical matrixectomy  preformed on 08/27/2016. She's remain in the surgical shoe. She states that she has numbness but this is not new. She does get a burning sensation intermittently to the right second toe which she states is not severe enough to take pain medicine is not all the time. She does feel it is slowly improving. She hasn't tenderness or pain on the incision. Denies any other pain or any recent injury. She has no concerns today. Denies any systemic complaints such as fevers, chills, nausea, vomiting. No calf pain, chest pain, shortness of breath.   Objective: General: No acute distress, AAOx3  DP/PT pulses palpable 2/4, CRT < 3 sec to all digits.  Protective sensation intact. Motor function intact.  Right foot: Incision is well coapted without any evidence of dehiscence and a single suture remains intact. There is no surrounding erythema, ascending cellulitis, fluctuance, crepitus, malodor, drainage/purulence. There is mild edema around the surgical site. There is minimal pain along the surgical site. The toe sits in a rectus position. Nails on the right side appeared to be dystrophic, discolored, elongated and she is asking to be trimmed today. No surrounding redness or drainage. The procedure site of the partial nail avulsion is healed.  No other areas of tenderness to bilateral lower extremities.  No other open lesions or pre-ulcerative lesions.  No pain with calf compression, swelling, warmth, erythema.   Assessment and Plan:  Status post right foot surgery, doing well with no complications   -Treatment options discussed including all alternatives, risks, and complications -X-rays of tenderness.Marland Kitchen Hardware intact. Status post hammertoe repair. Noticed acute fracture. -I removed about one last suture today. Recommend continue antibiotic  ointment dressing changes although the incision is healing well. She can return to regular shoe as able -Continue ice and elevation. -When she returns to regular shoe discussed gradual return to activity. -Debrided the nails the right foot with her request without complications or bleeding. -Monitor for any clinical signs or symptoms of infection and DVT/PE and directed to call the office immediately should any occur or go to the ER. -Follow-up as scheduled  or sooner if any problems arise. In the meantime, encouraged to call the office with any questions, concerns, change in symptoms.   Celesta Gentile, DPM

## 2016-10-03 NOTE — Progress Notes (Signed)
DOS 04.25.2018 Right 2nd digit hammertoe repair with screw/wire fixation, ingrown toenail removal right big toe (medial) with permanent removal at corner.

## 2016-10-23 ENCOUNTER — Ambulatory Visit (INDEPENDENT_AMBULATORY_CARE_PROVIDER_SITE_OTHER): Payer: Medicare Other

## 2016-10-23 ENCOUNTER — Ambulatory Visit (INDEPENDENT_AMBULATORY_CARE_PROVIDER_SITE_OTHER): Payer: Medicare Other | Admitting: Podiatry

## 2016-10-23 ENCOUNTER — Encounter: Payer: Self-pay | Admitting: Podiatry

## 2016-10-23 ENCOUNTER — Ambulatory Visit: Payer: 59 | Admitting: Podiatry

## 2016-10-23 DIAGNOSIS — Z9889 Other specified postprocedural states: Secondary | ICD-10-CM | POA: Diagnosis not present

## 2016-10-23 DIAGNOSIS — M79674 Pain in right toe(s): Secondary | ICD-10-CM

## 2016-10-23 DIAGNOSIS — M2041 Other hammer toe(s) (acquired), right foot: Secondary | ICD-10-CM

## 2016-10-23 DIAGNOSIS — M79675 Pain in left toe(s): Secondary | ICD-10-CM | POA: Diagnosis not present

## 2016-10-23 DIAGNOSIS — B351 Tinea unguium: Secondary | ICD-10-CM

## 2016-10-29 NOTE — Progress Notes (Signed)
Subjective: Brooke Proctor is a 66 y.o. is seen today in office s/p right 2nd hammertoe repair with screw fixation and partial nail avulsion with chemical matrixectomy  preformed on 08/27/2016. She states that she is doing well. She is able to wear a regular shoe without any issues. She still gets some swelling to the 2nd toe but is improving. She states that she is happy with how it looks. She is asking for her nails to be trimmed today as they are thick and elongated and she has difficulty doing them as she has neuropathy and cannot feel them well. Denies any other pain or any recent injury. She has no concerns today. Denies any systemic complaints such as fevers, chills, nausea, vomiting. No calf pain, chest pain, shortness of breath.   Objective: General: No acute distress, AAOx3  DP/PT pulses palpable 2/4, CRT < 3 sec to all digits.  Protective sensation intact. Motor function intact.  Right foot: Incision is well coapted without any evidence of dehiscence and a scar has formed. There is no surrounding erythema, ascending cellulitis, fluctuance, crepitus, malodor, drainage/purulence. There is minimal edema around the surgical site. There is no pain along the surgical site. The toe sits in a rectus position. Mild scar contracture of the PIPJ area but she states she is not concerned about this.  Nails on the right side appeared to be dystrophic, discolored, elongated and she is asking to be trimmed today. No surrounding redness or drainage. The procedure site of the partial nail avulsion is healed.  No other areas of tenderness to bilateral lower extremities.  No other open lesions or pre-ulcerative lesions.  No pain with calf compression, swelling, warmth, erythema.   Assessment and Plan:  Status post right foot surgery, doing well with no complications; symptomatic onychomycosis   -Treatment options discussed including all alternatives, risks, and complications -In the course of the second  toe she is doing well. Continue with compression to help with swelling. She can continue regular shoe gear gradual increase activity level. Continue icing the area as well. -Nails are sharply debrided 10 without complications or bleeding. -RTC in 6 weeks or sooner if needed.  Celesta Gentile, DPM

## 2016-12-04 ENCOUNTER — Encounter: Payer: Self-pay | Admitting: Podiatry

## 2016-12-04 ENCOUNTER — Ambulatory Visit (INDEPENDENT_AMBULATORY_CARE_PROVIDER_SITE_OTHER): Payer: Medicare Other | Admitting: Podiatry

## 2016-12-04 DIAGNOSIS — L600 Ingrowing nail: Secondary | ICD-10-CM | POA: Diagnosis not present

## 2016-12-04 NOTE — Progress Notes (Signed)
Subjective: Brooke Proctor presents the office they for concerns of recurrent ingrown toenail to the right big toe on the medial aspect. She states that she only gets occasional discomfort at the tip of the toenail. She denies any redness or drainage or any swelling. She's the areas painful pressure in shoes. She states her second toe and versus surgery is doing very well and having no complaints or issues of the toe and her only issues ingrown toenail today. She presented have an ingrown toenail procedure performed in April 2018. Denies any systemic complaints such as fevers, chills, nausea, vomiting. No acute changes since last appointment, and no other complaints at this time.   Objective: AAO x3, NAD DP/PT pulses palpable bilaterally, CRT less than 3 seconds Incision from the surgery on the right second toe is well-healed the ptosis in rectus position. No pain of the toe there is no specific swelling or redness or any drainage or any signs of infection. No pain.  There is mild incurvation along the medial aspect of the right hallux toenail distally there some minimal discomfort of the distal medial aspect of the nail. There is no significant incurvation along the proximal medial aspect of the toenail. The nail and general has some dystrophy and this incurvated. There is Gallery discoloration of the nails. No open lesions or pre-ulcerative lesions.  No pain with calf compression, swelling, warmth, erythema  Assessment: Ingrown toenail right medial hallux without signs of infection  Plan: -All treatment options discussed with the patient including all alternatives, risks, complications.  -Discussed repeat procedure however the area of ingrowing is only the distal aspect of the nail is really her nails growing into the nail fungus. I sharply debrided the distal portion the nails without any complications or bleeding. In void due to another procedure and the toenail. There is no signs of infection but  continues to monitor closely. There is no pain to the area debrided. -In regards to surgery she is doing well and discharged from the postoperative course. There is any issues to call the office. -Patient encouraged to call the office with any questions, concerns, change in symptoms.   Celesta Gentile, DPM

## 2016-12-04 NOTE — Patient Instructions (Signed)
You can get fungi-nail at the drug store to put on your toenails daily.   Have a good rest of your week!  Call me if you need anything

## 2017-01-01 ENCOUNTER — Telehealth: Payer: Self-pay | Admitting: *Deleted

## 2017-01-01 NOTE — Telephone Encounter (Signed)
Per dr Ellyn Hack , will need appointment for cardiac clearance  Left knee : left knee arthroscopy with menisectomy---- Dr Latanya Maudlin  Okay to follow up with extender  Appointment schedule for 01/06/17 at 3:30 pm. Patient verbalized understanding.  Left message for [place] ( dr gioffre's office)of pending appointment

## 2017-01-02 DIAGNOSIS — Z5321 Procedure and treatment not carried out due to patient leaving prior to being seen by health care provider: Secondary | ICD-10-CM | POA: Insufficient documentation

## 2017-01-02 DIAGNOSIS — R079 Chest pain, unspecified: Secondary | ICD-10-CM | POA: Insufficient documentation

## 2017-01-02 NOTE — ED Triage Notes (Signed)
The pt is c/o chest pain since 1300 today no  Sob n or dizziness.  Mi in 2016 and she has stents in her heart

## 2017-01-03 ENCOUNTER — Emergency Department (HOSPITAL_COMMUNITY)
Admission: EM | Admit: 2017-01-03 | Discharge: 2017-01-03 | Disposition: A | Discharge status: Home / Self Care | Payer: Medicare Other | Attending: Emergency Medicine | Admitting: Emergency Medicine

## 2017-01-03 ENCOUNTER — Encounter (HOSPITAL_COMMUNITY): Payer: Self-pay | Admitting: *Deleted

## 2017-01-03 ENCOUNTER — Emergency Department (HOSPITAL_COMMUNITY): Payer: Medicare Other

## 2017-01-03 DIAGNOSIS — R079 Chest pain, unspecified: Secondary | ICD-10-CM | POA: Diagnosis not present

## 2017-01-03 HISTORY — PX: NM MYOVIEW LTD: HXRAD82

## 2017-01-03 LAB — CBC
HEMATOCRIT: 38.4 % (ref 36.0–46.0)
HEMOGLOBIN: 12.1 g/dL (ref 12.0–15.0)
MCH: 27.1 pg (ref 26.0–34.0)
MCHC: 31.5 g/dL (ref 30.0–36.0)
MCV: 86.1 fL (ref 78.0–100.0)
PLATELETS: 251 10*3/uL (ref 150–400)
RBC: 4.46 MIL/uL (ref 3.87–5.11)
RDW: 13.2 % (ref 11.5–15.5)
WBC: 6.5 10*3/uL (ref 4.0–10.5)

## 2017-01-03 LAB — BASIC METABOLIC PANEL
ANION GAP: 10 (ref 5–15)
BUN: 14 mg/dL (ref 6–20)
CO2: 24 mmol/L (ref 22–32)
Calcium: 9.7 mg/dL (ref 8.9–10.3)
Chloride: 106 mmol/L (ref 101–111)
Creatinine, Ser: 0.88 mg/dL (ref 0.44–1.00)
GFR calc Af Amer: >60 mL/min (ref >60)
GFR calc non Af Amer: >60 mL/min (ref >60)
GLUCOSE: 88 mg/dL (ref 65–99)
POTASSIUM: 3.3 mmol/L — AB (ref 3.5–5.1)
Sodium: 140 mmol/L (ref 135–145)

## 2017-01-03 LAB — TROPONIN I: Troponin I: <0.03 ng/mL (ref <0.03)

## 2017-01-03 NOTE — ED Notes (Signed)
The pt came to desk /  She is very angry that she has waited for over 4 hours and has not been seen  She reports that she is not waiting any longer she was the next to go back.  Pt left

## 2017-01-03 NOTE — ED Triage Notes (Signed)
She also has had some feet and leg swelling

## 2017-01-06 ENCOUNTER — Encounter: Payer: Self-pay | Admitting: Physician Assistant

## 2017-01-06 ENCOUNTER — Ambulatory Visit (INDEPENDENT_AMBULATORY_CARE_PROVIDER_SITE_OTHER): Payer: Medicare Other | Admitting: Physician Assistant

## 2017-01-06 VITALS — BP 128/86 | HR 78 | Ht 66.0 in | Wt 214.0 lb

## 2017-01-06 DIAGNOSIS — Z01818 Encounter for other preprocedural examination: Secondary | ICD-10-CM | POA: Diagnosis not present

## 2017-01-06 DIAGNOSIS — E039 Hypothyroidism, unspecified: Secondary | ICD-10-CM

## 2017-01-06 DIAGNOSIS — E876 Hypokalemia: Secondary | ICD-10-CM | POA: Diagnosis not present

## 2017-01-06 DIAGNOSIS — E785 Hyperlipidemia, unspecified: Secondary | ICD-10-CM

## 2017-01-06 DIAGNOSIS — I2510 Atherosclerotic heart disease of native coronary artery without angina pectoris: Secondary | ICD-10-CM | POA: Diagnosis not present

## 2017-01-06 DIAGNOSIS — I10 Essential (primary) hypertension: Secondary | ICD-10-CM | POA: Diagnosis not present

## 2017-01-06 MED ORDER — POTASSIUM CHLORIDE CRYS ER 20 MEQ PO TBCR: 20.0000 meq | EXTENDED_RELEASE_TABLET | Freq: Every day | ORAL | 3 refills | Status: DC

## 2017-01-06 NOTE — Patient Instructions (Addendum)
Medication Instructions:   RESTART POTASSIUM 20 MEQ ONCE DAILY  LAB WORK:  Your physician recommends that you return for lab work in: 2 WEEKS  Testing/Procedures:  Your physician has requested that you have a lexiscan myoview. For further information please visit [email or web address]. Please follow instruction sheet, as given.    Follow-Up:  Your physician wants you to follow-up in: [place] will receive a reminder letter in the mail two months in advance. If you don't receive a letter, please call our office to schedule the follow-up appointment.   If you need a refill on your cardiac medications before your next appointment, please call your pharmacy.

## 2017-01-06 NOTE — Progress Notes (Signed)
Cardiology Office Note    Date:  01/08/2017   ID:  Brooke Proctor, DOB 1951/04/03, MRN [id number]  PCP:  Nicola Girt, DO  Cardiologist:  Dr. Ellyn Hack   Chief Complaint  Patient presents with  . Follow-up    Cardiac clearance.  . Shortness of Breath    Walking a distance.  . Chest Pain    Swallowing she has a pain in her chest.  . Headache   Preoperative clearance requested by Dr. Latanya Maudlin for upcoming knee surgery  History of Present Illness:  Brooke Proctor is a 66 y.o. female with PMH of HTN, breast CA, obesity, hypothyroidism, HLD and CAD. He had NSTEMI in May 2016 and had PCI of LAD and RCA. Cardiac catheterization performed on 09/20/2014 showed 20% proximal to mid LAD, 50% OM1, 50% OM 2, 90% mid LAD lesion treated with DES, 80% mid RCA lesion treated with DES complicated by transient no flow phenomenon. Echocardiogram obtained on the following day showed EF 60-65%, mild LVH, grade 1 DD, mild AI/MR. She also had a 2 week event monitor in June 2017 that showed sinus rhythm with sinus tachycardia of unknown arrhythmia.   Patient planned to undergo left knee arthroscopy with menisectomy by Dr. Latanya Maudlin and presented today for cardiology office visit. Of note, she went to the ED on 01/02/2017 with chest pain and lower extremity edema. However she left before she could be evaluated.  Patient presents today for cardiology office visit. She says the only lower extremity edema she has is in the left knee. As far as chest pain, she also has been complaining of chest pain at rest. It does not appears to occur with exertion. She has no heart failure symptoms including lower extremity edema, orthopnea or PND. I recommended Lexiscan Myoview prior to her knee surgery. Lab work obtained during the most he recently ED visit shows hypokalemia, she has not been taking her potassium supplement. She will need to restart on potassium supplement and obtain lab work in 2 weeks.   Past  Medical History:  Diagnosis Date  . Breast cancer ([place])   . CAD S/P percutaneous coronary angioplasty    a. NSTEMI 09/2014: s/p DES to mLAD and mRCA. Normal EF.  . Essential hypertension   . History of radiation therapy   . Hyperlipidemia   . Mild valvular heart disease    a. Echo 09/2014: mild AI/MR.  . NSTEMI (non-ST elevated myocardial infarction) ([place]) 09/19/2014  . Obesity   . Status post chemotherapy   . Thyroid disease     Past Surgical History:  Procedure Laterality Date  . CARDIAC CATHETERIZATION N/A 09/20/2014   Procedure: Left Heart Cath and Coronary Angiography;  Surgeon: Peter M Martinique, MD;  Location: [facility];  Service: Cardiovascular; 80% mLAD, 90% mRCA  . CARDIAC CATHETERIZATION  09/20/2014   Procedure: Coronary Stent Intervention;  Surgeon: Peter M Martinique, MD;  Location: [facility];  Service: Cardiovascular;;mLAD Promus Premeir DES 2.5 mm x 16 mm , mRCA - Promus Premier DES 4.0 mm x 16 mm (4.5 mm)   . LYMPH NODE DISSECTION    . TRANSTHORACIC ECHOCARDIOGRAM  09/21/2014   Normal LV Size, mild LVH.  EF 60-65%, Gr 1 DD, mild AI & MR    Current Medications: Outpatient Medications Prior to Visit  Medication Sig Dispense Refill  . acetaminophen (TYLENOL) 325 MG tablet Take 2 tablets (650 mg total) by mouth every 4 (four) hours as needed for  headache or mild pain.    Marland Kitchen atorvastatin (LIPITOR) 40 MG tablet TAKE 1 TABLET(40 MG) BY MOUTH AT BEDTIME 90 tablet 2  . benazepril (LOTENSIN) 20 MG tablet TAKE 1 TABLET(20 MG) BY MOUTH DAILY 90 tablet 2  . Biotin 1000 MCG tablet Take 1,000 mcg by mouth daily.    . cephALEXin (KEFLEX) 500 MG capsule Take 500 mg by mouth 3 (three) times daily.    . ergocalciferol (VITAMIN D2) 50000 UNITS capsule Take 50,000 Units by mouth once a week. Take on sundays    . fluticasone (FLONASE) 50 MCG/ACT nasal spray Place 2 sprays into both nostrils daily.    Marland Kitchen guaiFENesin 200 MG tablet Take 200 mg by mouth every 4 (four) hours as needed  for cough or to loosen phlegm.    . hydrochlorothiazide (HYDRODIURIL) 25 MG tablet TAKE 1 TABLET(25 MG) BY MOUTH DAILY 90 tablet 2  . HYDROcodone-acetaminophen (NORCO/VICODIN) 5-325 MG tablet Take 1 tablet by mouth every 6 (six) hours as needed for moderate pain. 1-2 tablets po q4-6 hours as needed for pain    . levothyroxine (SYNTHROID, LEVOTHROID) 100 MCG tablet Take 100 mcg by mouth daily before breakfast.    . meclizine (ANTIVERT) 12.5 MG tablet Take 12.5 mg by mouth 3 (three) times daily as needed for dizziness.    . metoprolol succinate (TOPROL-XL) 25 MG 24 hr tablet Take 1 tablet (25 mg total) by mouth daily. 90 tablet 1  . metoprolol succinate (TOPROL-XL) 25 MG 24 hr tablet TAKE 1 TABLET(25 MG) BY MOUTH DAILY 90 tablet 2  . nitroGLYCERIN (NITROSTAT) 0.4 MG SL tablet PLACE 1 TABLET UNDER THE TONGUE EVERY 5 MINUTES FOR 3 DOSES AS NEEDED FOR CHEST PAIN 25 tablet 2  . promethazine (PHENERGAN) 25 MG tablet Take 25 mg by mouth every 8 (eight) hours as needed for nausea or vomiting.    . ticagrelor (BRILINTA) 60 MG TABS tablet Take 1 tablet (60 mg total) by mouth 2 (two) times daily. 180 tablet 3  . potassium chloride SA (K-DUR,KLOR-CON) 20 MEQ tablet TAKE 2 TABLETS BY MOUTH DAILY 180 tablet 3   No facility-administered medications prior to visit.      Allergies:   Eggs or egg-derived products; Nickel; Adhesive [tape]; and Latex   Social History   Social History  . Marital status: Divorced    Spouse name: N/A  . Number of children: N/A  . Years of education: N/A   Social History Main Topics  . Smoking status: Never Smoker  . Smokeless tobacco: Never Used  . Alcohol use No  . Drug use: No  . Sexual activity: Not Asked   Other Topics Concern  . None   Social History Narrative  . None     Family History:  The patient's family history includes Hypertension in her father and mother; Kidney disease in her father; Stroke in her mother.   ROS:   Please see the history of present  illness.    ROS All other systems reviewed and are negative.   PHYSICAL EXAM:   VS:  BP 128/86   Pulse 78   Ht 5\' 6"  (1.676 m)   Wt 214 lb (97.1 kg)   BMI 34.54 kg/m    GEN: Well nourished, well developed, in no acute distress  HEENT: normal  Neck: no JVD, carotid bruits, or masses Cardiac: RRR; no murmurs, rubs, or gallops,no edema  Respiratory:  clear to auscultation bilaterally, normal work of breathing GI: soft, nontender, nondistended, + BS MS:  no deformity or atrophy  Skin: warm and dry, no rash Neuro:  Alert and Oriented x 3, Strength and sensation are intact Psych: euthymic mood, full affect  Wt Readings from Last 3 Encounters:  01/06/17 214 lb (97.1 kg)  01/02/17 209 lb (94.8 kg)  03/10/16 213 lb (96.6 kg)      Studies/Labs Reviewed:   EKG:  EKG is not ordered today.    Recent Labs: 01/03/2017: BUN 14; Creatinine, Ser 0.88; Hemoglobin 12.1; Platelets 251; Potassium 3.3; Sodium 140   Lipid Panel    Component Value Date/Time   CHOL 102 (L) 10/23/2015 1105   TRIG 61 10/23/2015 1105   HDL 40 (L) 10/23/2015 1105   CHOLHDL 2.6 10/23/2015 1105   VLDL 12 10/23/2015 1105   LDLCALC 50 10/23/2015 1105    Additional studies/ records that were reviewed today include:   Cath 09/20/2014 Conclusion    Prox LAD to Mid LAD lesion, 20% stenosed.  1st Mrg lesion, 30% stenosed.  2nd Mrg lesion, 30% stenosed.  Mid LAD lesion, 90% stenosed. There is a 0% residual stenosis post intervention.  A drug-eluting stent was placed.  Mid RCA to Dist RCA lesion, 80% stenosed. There is a 0% residual stenosis post intervention.  A drug-eluting stent was placed.   1. 2 vessel obstructive CAD 2. Normal LV function 3. Successful stenting of the mid LAD with DES 4. Successful stenting of the mid RCA with DES. Transient no reflow phenomenon.   Recommendations: continue DAPT for one year. Risk factor modification.    Echo 09/21/2014 LV EF: 60% -   65%  Study  Conclusions  - Left ventricle: The cavity size was normal. Wall thickness was   increased in a pattern of mild LVH. Systolic function was normal.   The estimated ejection fraction was in the range of 60% to 65%.   Doppler parameters are consistent with abnormal left ventricular   relaxation (grade 1 diastolic dysfunction). - Aortic valve: There was mild regurgitation. - Mitral valve: There was mild regurgitation.    ASSESSMENT:    1. Pre-op testing   2. Coronary artery disease involving native coronary artery of native heart without angina pectoris   3. Hyperlipidemia, unspecified hyperlipidemia type   4. Hypokalemia   5. Essential hypertension   6. Hypothyroidism, unspecified type      PLAN:  In order of problems listed above:  1. Preoperative clearance prior to upcoming knee surgery: She will need to hold Brilinta for 7 days prior to the surgery. She has been having some recent chest pain. We will proceed with a Lexiscan Myoview.  2. CAD: She does have occasional chest pain, it occurs at rest and does not occur with exertion. She does not exert herself too much due to knee pain.  3. Hypertension: Well-controlled  4. Hyperlipidemia: She is due for repeat fasting lipid panel.  5. Hypokalemia: Seen on recent lab work obtained in the ED. She was never treated as she left before she was seen by ED physician. She has not been taking her potassium supplement, she will need to restart and obtain repeat lab work in 2 weeks  6. Hypothyroidism: On Synthroid    Medication Adjustments/Labs and Tests Ordered: Current medicines are reviewed at length with the patient today.  Concerns regarding medicines are outlined above.  Medication changes, Labs and Tests ordered today are listed in the Patient Instructions below. Patient Instructions  Medication Instructions:   RESTART POTASSIUM 20 MEQ ONCE DAILY  LAB WORK:  Your physician recommends that you return for lab work in: 2  WEEKS  Testing/Procedures:  Your physician has requested that you have a lexiscan myoview. For further information please visit [email or web address]. Please follow instruction sheet, as given.    Follow-Up:  Your physician wants you to follow-up in: [place] will receive a reminder letter in the mail two months in advance. If you don't receive a letter, please call our office to schedule the follow-up appointment.   If you need a refill on your cardiac medications before your next appointment, please call your pharmacy.     Hilbert Corrigan, [place]  01/08/2017 5:12 AM    [address], [place], [place]  [address] Phone: [phone]; Fax: [phone]

## 2017-01-07 ENCOUNTER — Telehealth (HOSPITAL_COMMUNITY): Payer: Self-pay

## 2017-01-07 NOTE — Telephone Encounter (Signed)
Encounter complete. 

## 2017-01-08 ENCOUNTER — Encounter: Payer: Self-pay | Admitting: Physician Assistant

## 2017-01-08 ENCOUNTER — Ambulatory Visit (HOSPITAL_COMMUNITY)
Admission: RE | Admit: 2017-01-08 | Discharge: 2017-01-08 | Disposition: A | Discharge status: Home / Self Care | Payer: Medicare Other | Source: Ambulatory Visit | Attending: Internal Medicine | Admitting: Internal Medicine

## 2017-01-08 ENCOUNTER — Telehealth: Payer: Self-pay | Admitting: *Deleted

## 2017-01-08 DIAGNOSIS — E039 Hypothyroidism, unspecified: Secondary | ICD-10-CM | POA: Insufficient documentation

## 2017-01-08 DIAGNOSIS — I252 Old myocardial infarction: Secondary | ICD-10-CM | POA: Insufficient documentation

## 2017-01-08 DIAGNOSIS — Z6834 Body mass index (BMI) 34.0-34.9, adult: Secondary | ICD-10-CM | POA: Insufficient documentation

## 2017-01-08 DIAGNOSIS — E669 Obesity, unspecified: Secondary | ICD-10-CM | POA: Insufficient documentation

## 2017-01-08 DIAGNOSIS — R5383 Other fatigue: Secondary | ICD-10-CM | POA: Insufficient documentation

## 2017-01-08 DIAGNOSIS — I2510 Atherosclerotic heart disease of native coronary artery without angina pectoris: Secondary | ICD-10-CM | POA: Insufficient documentation

## 2017-01-08 DIAGNOSIS — Z01818 Encounter for other preprocedural examination: Secondary | ICD-10-CM | POA: Diagnosis present

## 2017-01-08 DIAGNOSIS — Z8249 Family history of ischemic heart disease and other diseases of the circulatory system: Secondary | ICD-10-CM | POA: Diagnosis not present

## 2017-01-08 DIAGNOSIS — I10 Essential (primary) hypertension: Secondary | ICD-10-CM | POA: Insufficient documentation

## 2017-01-08 DIAGNOSIS — Z79899 Other long term (current) drug therapy: Secondary | ICD-10-CM

## 2017-01-08 DIAGNOSIS — R079 Chest pain, unspecified: Secondary | ICD-10-CM | POA: Insufficient documentation

## 2017-01-08 DIAGNOSIS — E785 Hyperlipidemia, unspecified: Secondary | ICD-10-CM

## 2017-01-08 LAB — MYOCARDIAL PERFUSION IMAGING
CHL CUP RESTING HR STRESS: 71 {beats}/min
CSEPPHR: 108 {beats}/min
LV sys vol: 28 mL
LVDIAVOL: 84 mL (ref 46–106)
SDS: 0
SRS: 0
SSS: 0
TID: 1.08

## 2017-01-08 MED ORDER — TECHNETIUM TC 99M TETROFOSMIN IV KIT
30.9000 | PACK | Freq: Once | INTRAVENOUS | Status: AC | PRN
  Administered 2017-01-08: 30.9 via INTRAVENOUS
  Filled 2017-01-08: qty 31

## 2017-01-08 MED ORDER — TECHNETIUM TC 99M TETROFOSMIN IV KIT
10.2000 | PACK | Freq: Once | INTRAVENOUS | Status: AC | PRN
  Administered 2017-01-08: 10.2 via INTRAVENOUS
  Filled 2017-01-08: qty 11

## 2017-01-08 MED ORDER — REGADENOSON 0.4 MG/5ML IV SOLN
0.4000 mg | Freq: Once | INTRAVENOUS | Status: AC
  Administered 2017-01-08: 0.4 mg via INTRAVENOUS

## 2017-01-08 NOTE — Telephone Encounter (Signed)
I will followup on result today, and potentially clear her for surgery if it is normal

## 2017-01-08 NOTE — Telephone Encounter (Signed)
Detailed instructions left on voicemail per authorization on file. Pt to return call for any questions or concerns.

## 2017-01-08 NOTE — Telephone Encounter (Signed)
S/w pt she states that she is stressing about her pre-op clearance she states that she will have the lab work done in 2 weeks and had stress test done today. She is asking if she is going to have to wait 2 weeks to be cleared for the surgery, she is "way stressed out she is not sleeping". Tried to re-assure her that we only do necessary testing for surgical clearance. Please advise on status of pre-op clearance and stress test results.

## 2017-01-08 NOTE — Telephone Encounter (Signed)
She does not have to wait 2 weeks for surgery, that's to followup potassium level and check cholesterol, they will not interfere with surgery. As for preop, only thing we need is stress test result.

## 2017-01-08 NOTE — Telephone Encounter (Signed)
New Message     Pt returning [place] call

## 2017-01-08 NOTE — Telephone Encounter (Signed)
-----   Message from [place], [place] sent at 01/08/2017  5:15 AM EDT ----- Regarding: add on lab She will need a FLP in 2 weeks when she return for BMET. Please add-on and inform patient  Thanks

## 2017-01-08 NOTE — Telephone Encounter (Signed)
She has this done today

## 2017-01-08 NOTE — Telephone Encounter (Signed)
Left detailed message(DPR) to come in and have fasting lab done on 01-22-17

## 2017-01-09 NOTE — Telephone Encounter (Signed)
Brooke Proctor contacted patient -- see results note.

## 2017-02-09 ENCOUNTER — Other Ambulatory Visit: Payer: 59

## 2017-02-09 LAB — BASIC METABOLIC PANEL WITH GFR
BUN: 12 mg/dL (ref 7–25)
CALCIUM: 9.6 mg/dL (ref 8.6–10.4)
CHLORIDE: 108 mmol/L (ref 98–110)
CO2: 27 mmol/L (ref 20–32)
Creat: 0.89 mg/dL (ref 0.50–0.99)
GFR, Est African American: 78 mL/min/{1.73_m2} (ref >=60)
GFR, Est Non African American: 68 mL/min/{1.73_m2} (ref >=60)
GLUCOSE: 105 mg/dL — AB (ref 65–99)
Potassium: 4.7 mmol/L (ref 3.5–5.3)
Sodium: 142 mmol/L (ref 135–146)

## 2017-02-16 IMAGING — CT CT ANGIO CHEST
2 of 8 series · 18 of 46 positions shown · IV contrast (omnipaque)
Comparison: None

CLINICAL DATA: Chest pain through to back, dyspnea, recent cold
with productive cough, history breast cancer in remission for 5
years post chemotherapy and radiation therapy, hypertension

EXAM:
CT ANGIOGRAPHY CHEST WITH CONTRAST
TECHNIQUE: Multidetector CT imaging of the chest was performed using the
standard protocol during bolus administration of intravenous
contrast. Multiplanar CT image reconstructions and MIPs were
obtained to evaluate the vascular anatomy.
CONTRAST:  80mL OMNIPAQUE IOHEXOL 350 MG/ML SOLN IV

[Series 6: thins · axial · 0.78mm/px · z∈[-287,-14]mm · 15 of 301 slices shown]
[im 14/301  lung]
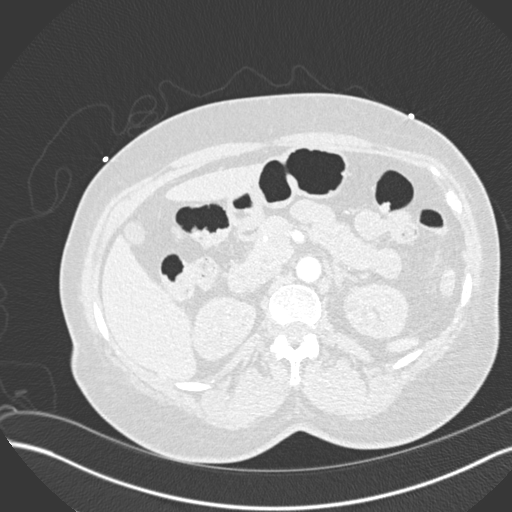
[im 41/301  soft-tissue]
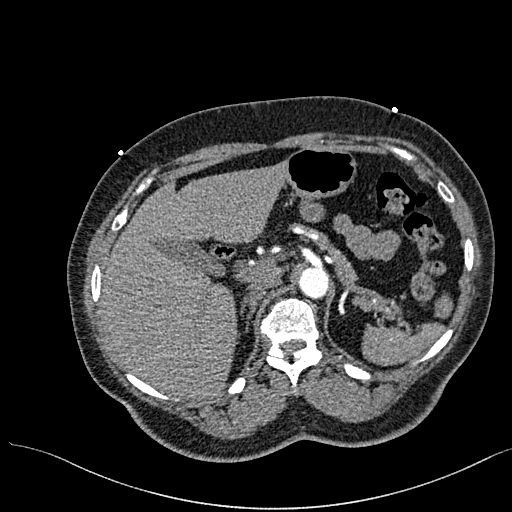
[im 55/301  lung]
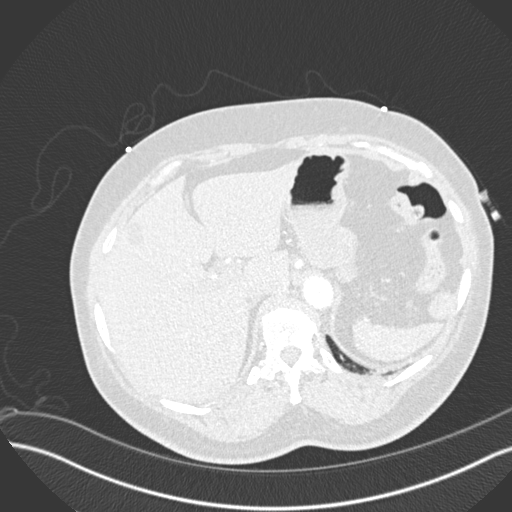
[im 69/301  soft-tissue]
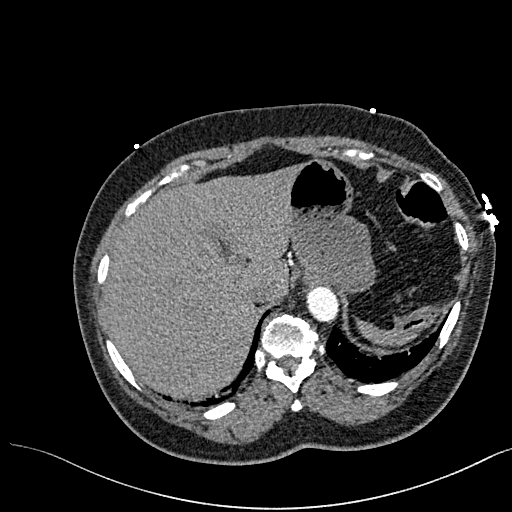
[im 96/301  lung]
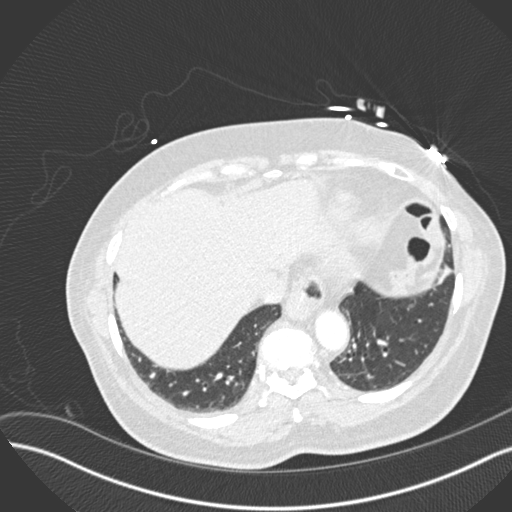
[im 110/301  soft-tissue]
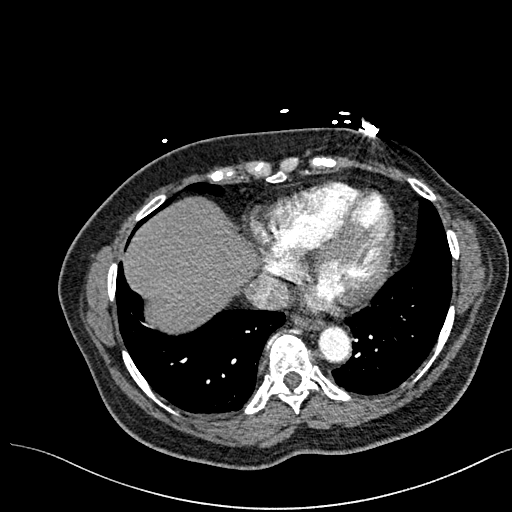
[im 137/301  lung]
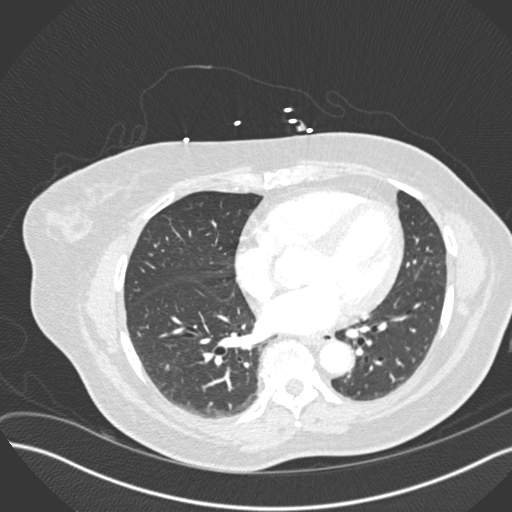
[im 151/301  soft-tissue]
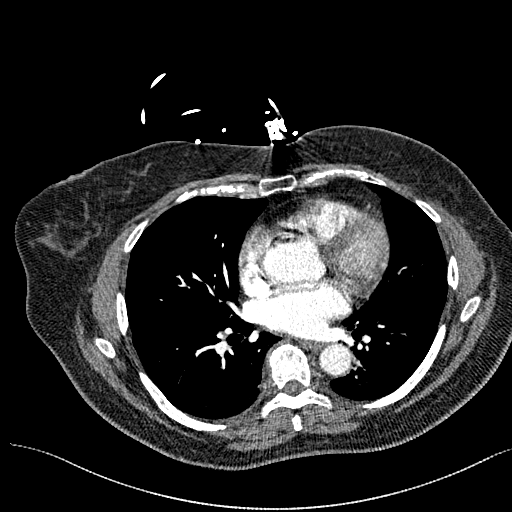
[im 164/301  lung]
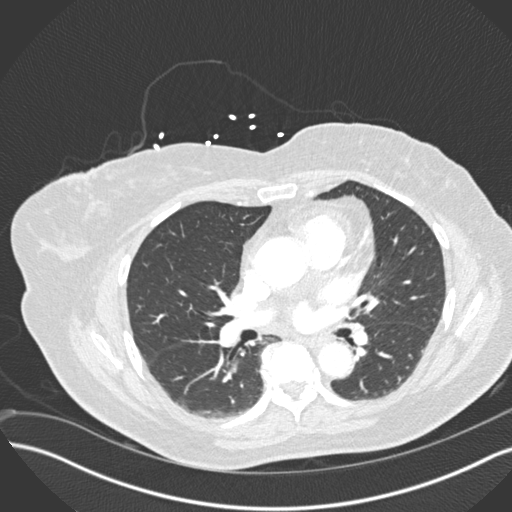
[im 191/301  soft-tissue]
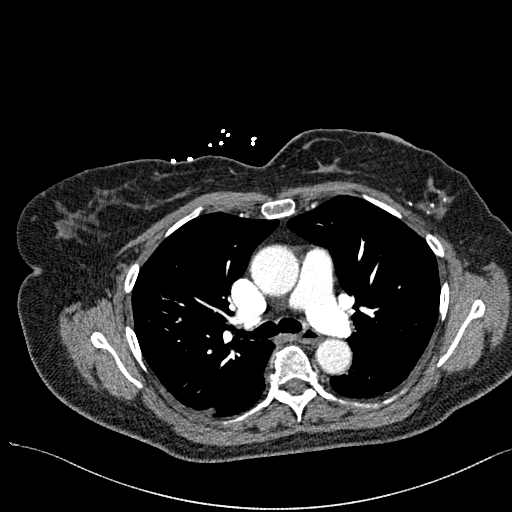
[im 205/301  lung]
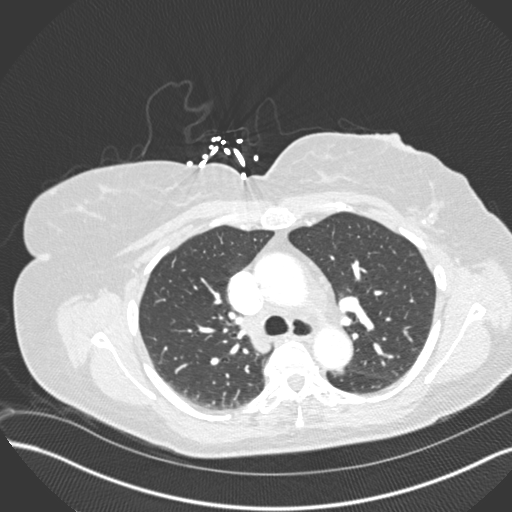
[im 232/301  soft-tissue]
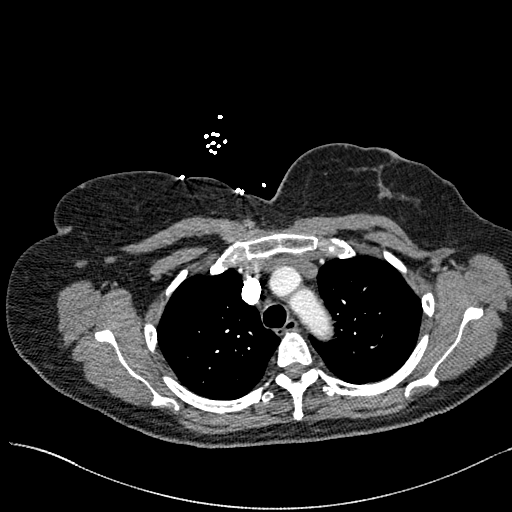
[im 246/301  lung]
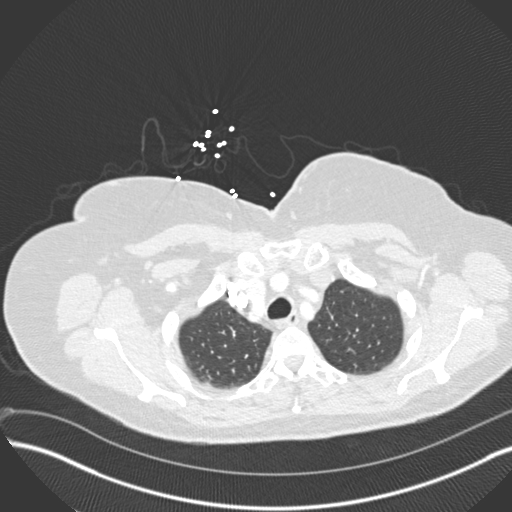
[im 260/301  soft-tissue]
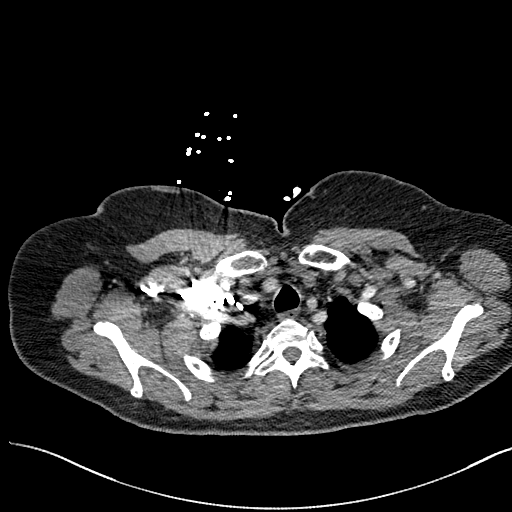
[im 287/301  lung]
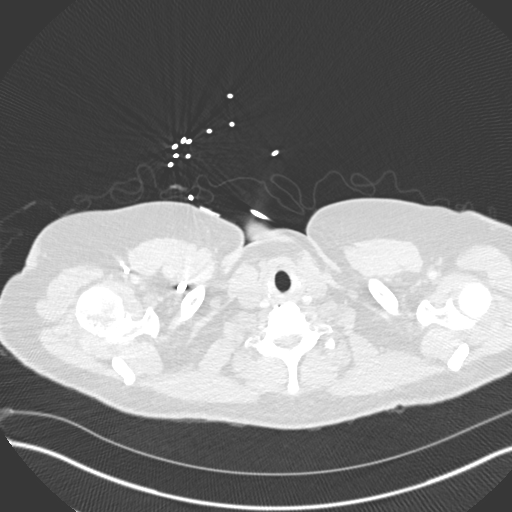

[Series 8: coronal mpr · coronal · 0.59mm/px · 3 of 140 slices shown]
[im 35/140  soft-tissue]
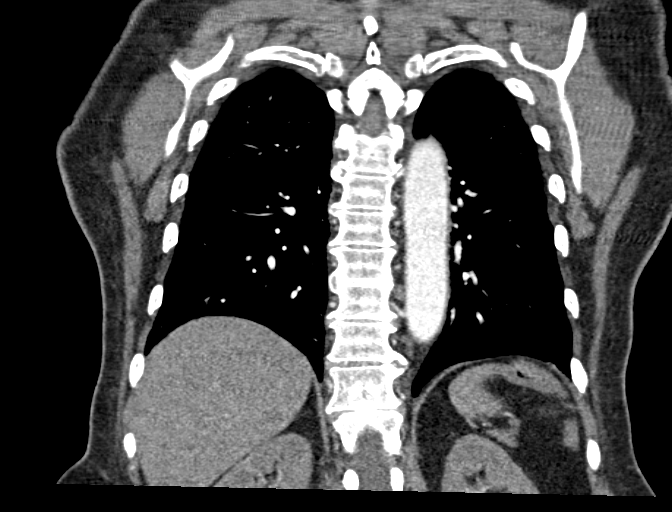
[im 70/140  soft-tissue]
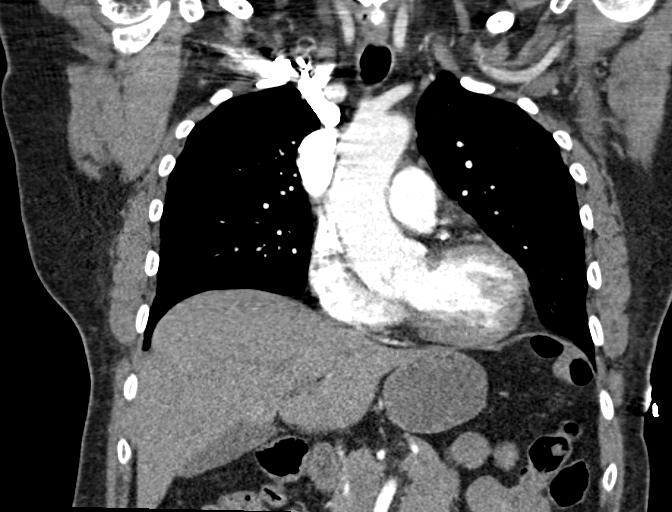
[im 105/140  soft-tissue]
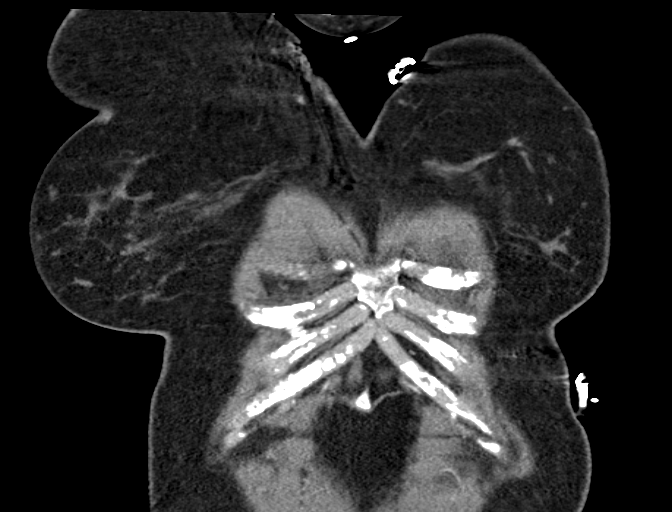

[18 of 46 positions shown; findings below may reference images not displayed]

FINDINGS: Aorta normal caliber without aneurysm or dissection.

Pulmonary arteries well opacified and patent.

No evidence of pulmonary embolism.

No thoracic adenopathy.

Tiny fluid collection LEFT axilla 21 x 10 mm question related to
prior surgery.

Focal density centrally within the LEFT breast could represent
scarring at a site of previous biopsy though mass not excluded, 10
mm diameter image 25.

16 x 13 mm probable cyst RIGHT lobe liver image 84.

Visualized upper abdomen otherwise normal appearance.

Small hiatal hernia.

4 mm RIGHT lower lobe nodule image 74, calcified.

Remaining lungs clear.

No infiltrate, pleural effusion or pneumothorax.

Scattered degenerative disc disease changes inferior cervical and
thoracic spine.

No acute osseous abnormalities.

Review of the MIP images confirms the above findings.
IMPRESSION: No evidence of pulmonary embolism.

No acute intra-abdominal or intrapelvic abnormalities.

Small hiatal hernia.

Probable small RIGHT lobe hepatic cyst.

Small nodular focus centrally in LEFT breast 10 mm diameter question
related to previous biopsy or scarring, recommend mammographic
follow-up and breast exam to exclude tumor.

Small fluid collection LEFT axilla question related to prior
surgery, recommend correlation with patient history.

## 2017-03-18 ENCOUNTER — Other Ambulatory Visit: Payer: Self-pay | Admitting: Podiatry

## 2017-04-03 ENCOUNTER — Other Ambulatory Visit: Payer: Self-pay | Admitting: Cardiology

## 2017-04-13 ENCOUNTER — Other Ambulatory Visit: Payer: Self-pay | Admitting: Cardiology

## 2017-04-15 NOTE — Telephone Encounter (Signed)
Rx has been sent to the pharmacy electronically. ° °

## 2017-04-20 ENCOUNTER — Telehealth: Payer: Self-pay | Admitting: Cardiology

## 2017-04-20 NOTE — Telephone Encounter (Signed)
New message      Pt c/o medication issue:  1. Name of Medication:  Tylenol arthriitis   2. How are you currently taking this medication (dosage and times per day)? Not taking it yet   3. Are you having a reaction (difficulty breathing--STAT)?  no  4. What is your medication issue?  Will this OTC  Medication interfere with her heart medication ? Is it ok for her to take ?

## 2017-04-20 NOTE — Telephone Encounter (Signed)
Spoke with pt, aware it is okay to take tylenol arthritis.

## 2017-05-11 ENCOUNTER — Telehealth: Payer: Self-pay | Admitting: Cardiology

## 2017-05-11 NOTE — Telephone Encounter (Signed)
Spoke with patient and she states that she is taking a medication that has a diuretic in it along with the HCTZ. Patient is unsure what it is but has it written down at home. Stated she will call back tomorrow with information .

## 2017-05-11 NOTE — Telephone Encounter (Signed)
New Message    Pt c/o medication issue:  1. Name of Medication:  hydrochlorothiazide (HYDRODIURIL)   2.How are you currently taking this medication (dosage and times per day)? 25 MG tablet   3. Are you having a reaction (difficulty breathing--STAT)? no  4. What is your medication issue? Patient is wanting to discontinue this medication due to the frequency of urination. She would like to discuss this further. Please call.

## 2017-05-27 DIAGNOSIS — M1712 Unilateral primary osteoarthritis, left knee: Secondary | ICD-10-CM | POA: Insufficient documentation

## 2017-05-27 DIAGNOSIS — S83242A Other tear of medial meniscus, current injury, left knee, initial encounter: Secondary | ICD-10-CM | POA: Insufficient documentation

## 2017-07-06 ENCOUNTER — Ambulatory Visit: Payer: 59 | Admitting: Cardiology

## 2017-07-10 ENCOUNTER — Other Ambulatory Visit: Payer: Self-pay | Admitting: Cardiology

## 2017-07-10 NOTE — Telephone Encounter (Signed)
REFILL 

## 2017-07-17 ENCOUNTER — Ambulatory Visit (INDEPENDENT_AMBULATORY_CARE_PROVIDER_SITE_OTHER): Payer: Medicare Other | Admitting: Cardiology

## 2017-07-17 ENCOUNTER — Encounter: Payer: Self-pay | Admitting: Cardiology

## 2017-07-17 VITALS — BP 144/90 | HR 69 | Ht 66.0 in | Wt 212.8 lb

## 2017-07-17 DIAGNOSIS — I252 Old myocardial infarction: Secondary | ICD-10-CM | POA: Diagnosis not present

## 2017-07-17 DIAGNOSIS — I38 Endocarditis, valve unspecified: Secondary | ICD-10-CM

## 2017-07-17 DIAGNOSIS — Z9861 Coronary angioplasty status: Secondary | ICD-10-CM

## 2017-07-17 DIAGNOSIS — I2510 Atherosclerotic heart disease of native coronary artery without angina pectoris: Secondary | ICD-10-CM

## 2017-07-17 DIAGNOSIS — E785 Hyperlipidemia, unspecified: Secondary | ICD-10-CM

## 2017-07-17 DIAGNOSIS — I10 Essential (primary) hypertension: Secondary | ICD-10-CM

## 2017-07-17 MED ORDER — NITROGLYCERIN 0.4 MG SL SUBL: SUBLINGUAL_TABLET | SUBLINGUAL | 2 refills | Status: DC

## 2017-07-17 MED ORDER — HYDROCHLOROTHIAZIDE 12.5 MG PO TABS: 12.5000 mg | ORAL_TABLET | Freq: Every day | ORAL | 3 refills | Status: DC

## 2017-07-17 MED ORDER — HYDROCHLOROTHIAZIDE 25 MG PO TABS: 12.5000 mg | ORAL_TABLET | Freq: Every day | ORAL | 3 refills | Status: DC

## 2017-07-17 NOTE — Progress Notes (Signed)
PCP: Nicola Girt, DO (replaced by a new MD)  Clinic Note: Chief Complaint  Patient presents with  . Follow-up    No complaints  . Coronary Artery Disease    PCI LAD & RCA in setting of NSTEMI    HPI: Brooke Proctor is a 67 y.o. female with a PMH below who presents today for delayed annual follow-up for CAD-PCI to the LAD and RCA.  Non-STEMI in May 2016 and had DES PCI to both LAD (90%) and RCA (80%- transient no-reflow).   Marliss Buttacavoli was last seen  in September 2018 by Almyra Deforest, PA in part for preop clearance for toe & knee surgery.  She had been evaluated with a 2 Wk event monitor back in June 2017 that revealed sinus rhythm and sinus tachycardia but no arrhythmia.  Brilinta reduced to 60 mg. --> B/C of an ER visit with nonexertional CP - Myoview ST ordered = Non-ischemic  Recent Hospitalizations: n/a  Studies Personally Reviewed - (if available, images/films reviewed: From Epic Chart or Care Everywhere)  Myoview September 2018: Normal/low risk study.  No ischemia or infarction.  EF 67%.  Interval History: [name] presents here today for follow-up doing fairly well.  She still has some knee discomfort since her knee surgery that limits her walking.  Because of pain, she has a little bit of dyspnea with exertion, but she also indicates that she is quite out of shape is not been able to do any exercise.  She has not had any further chest tightness or pressure with rest or exertion. She only gets a little bit dizzy when there is changes in weather and that she is hot.  Otherwise she denies any syncope/near syncope or TIA/amaurosis fugax. No further episodes of palpitations or rapid irregular heartbeats.   She still has some swelling associated with her knee surgery, but has no real edema.  No PND or orthopnea.  No claudication.  Relatively astigmatic from a cardiac standpoint.  ROS: A comprehensive was performed. Review of Systems  Constitutional: Negative for chills,  fever and malaise/fatigue (Just deconditioned).  HENT: Negative for congestion, nosebleeds and sore throat.   Respiratory: Negative for cough and shortness of breath.   Gastrointestinal: Negative for abdominal pain, blood in stool, heartburn and melena.  Musculoskeletal: Positive for joint pain (Knee pain).  Neurological: Negative for weakness.  Psychiatric/Behavioral: Negative for depression and memory loss. The patient is not nervous/anxious and does not have insomnia.   All other systems reviewed and are negative.  I have reviewed and (if needed) personally updated the patient's problem list, medications, allergies, past medical and surgical history, social and family history.   Past Medical History:  Diagnosis Date  . Breast cancer ([place])   . CAD S/P percutaneous coronary angioplasty    a. NSTEMI 09/2014: s/p DES to mLAD and mRCA. Normal EF.  . Essential hypertension   . History of radiation therapy   . Hyperlipidemia   . Mild valvular heart disease    a. Echo 09/2014: mild AI/MR.  . NSTEMI (non-ST elevated myocardial infarction) ([place]) 09/19/2014  . Obesity   . Status post chemotherapy   . Thyroid disease     Past Surgical History:  Procedure Laterality Date  . CARDIAC CATHETERIZATION N/A 09/20/2014   Procedure: Left Heart Cath and Coronary Angiography;  Surgeon: Peter M Martinique, MD;  Location: [facility];  Service: Cardiovascular; 80% mLAD, 90% mRCA  . CARDIAC CATHETERIZATION  09/20/2014   Procedure: Coronary  Stent Intervention;  Surgeon: Peter M Martinique, MD;  Location: [facility];  Service: Cardiovascular;;mLAD Promus Premeir DES 2.5 mm x 16 mm , mRCA - Promus Premier DES 4.0 mm x 16 mm (4.5 mm)   . CARDIAC EVENT MONITOR  10/2015   SR & S Tachycardia. Occasional PVCs.  No arrhythmias  . LYMPH NODE DISSECTION    . NM MYOVIEW LTD  01/2017   Normal/low risk study.  No ischemia or infarction.  EF 67%.  . TRANSTHORACIC ECHOCARDIOGRAM  09/21/2014   Normal LV Size, mild  LVH.  EF 60-65%, Gr 1 DD, mild AI & MR     Current Meds  Medication Sig  . acetaminophen (TYLENOL) 325 MG tablet Take 2 tablets (650 mg total) by mouth every 4 (four) hours as needed for headache or mild pain.  Marland Kitchen atorvastatin (LIPITOR) 40 MG tablet TAKE 1 TABLET(40 MG) BY MOUTH AT BEDTIME  . benazepril (LOTENSIN) 20 MG tablet TAKE 1 TABLET(20 MG) BY MOUTH DAILY  . Biotin 1000 MCG tablet Take 1,000 mcg by mouth daily.  Marland Kitchen BRILINTA 60 MG TABS tablet TAKE 1 TABLET(60 MG) BY MOUTH TWICE DAILY  . celecoxib (CELEBREX) 200 MG capsule Take 1 capsule by mouth as directed.  . cephALEXin (KEFLEX) 500 MG capsule Take 500 mg by mouth 3 (three) times daily.  . ergocalciferol (VITAMIN D2) 50000 UNITS capsule Take 50,000 Units by mouth once a week. Take on sundays  . fluticasone (FLONASE) 50 MCG/ACT nasal spray Place 2 sprays into both nostrils daily.  Marland Kitchen guaiFENesin 200 MG tablet Take 200 mg by mouth every 4 (four) hours as needed for cough or to loosen phlegm.  . hydrochlorothiazide (HYDRODIURIL) 12.5 MG tablet Take 1 tablet (12.5 mg total) by mouth daily.  Marland Kitchen HYDROcodone-acetaminophen (NORCO/VICODIN) 5-325 MG tablet Take 1 tablet by mouth every 6 (six) hours as needed for moderate pain. 1-2 tablets po q4-6 hours as needed for pain  . levothyroxine (SYNTHROID, LEVOTHROID) 100 MCG tablet Take 100 mcg by mouth daily before breakfast.  . meclizine (ANTIVERT) 12.5 MG tablet Take 12.5 mg by mouth 3 (three) times daily as needed for dizziness.  . metoprolol succinate (TOPROL-XL) 25 MG 24 hr tablet Take 1 tablet (25 mg total) by mouth daily.  . nitroGLYCERIN (NITROSTAT) 0.4 MG SL tablet PLACE 1 TABLET UNDER THE TONGUE EVERY 5 MINUTES FOR 3 DOSES AS NEEDED FOR CHEST PAIN  . potassium chloride SA (K-DUR,KLOR-CON) 20 MEQ tablet Take 1 tablet (20 mEq total) by mouth daily.  . promethazine (PHENERGAN) 25 MG tablet Take 25 mg by mouth every 8 (eight) hours as needed for nausea or vomiting.  . [DISCONTINUED]  hydrochlorothiazide (HYDRODIURIL) 25 MG tablet TAKE 1 TABLET(25 MG) BY MOUTH DAILY  . [DISCONTINUED] hydrochlorothiazide (HYDRODIURIL) 25 MG tablet Take 0.5 tablets (12.5 mg total) by mouth daily.  . [DISCONTINUED] metoprolol succinate (TOPROL-XL) 25 MG 24 hr tablet TAKE 1 TABLET(25 MG) BY MOUTH DAILY  . [DISCONTINUED] nitroGLYCERIN (NITROSTAT) 0.4 MG SL tablet PLACE 1 TABLET UNDER THE TONGUE EVERY 5 MINUTES FOR 3 DOSES AS NEEDED FOR CHEST PAIN  . [DISCONTINUED] ticagrelor (BRILINTA) 60 MG TABS tablet Take 1 tablet (60 mg total) by mouth 2 (two) times daily.    Allergies  Allergen Reactions  . Eggs Or Egg-Derived Products Swelling    ALLERGY: EGG WHITES  . Nickel Swelling and Other (See Comments)    REACTION:"BLISTERS"  . Adhesive [Tape] Itching  . Latex Itching    Social History   Tobacco Use  .  Smoking status: Never Smoker  . Smokeless tobacco: Never Used  Substance Use Topics  . Alcohol use: No    Alcohol/week: 0.0 oz  . Drug use: No   Social History   Social History Narrative  . Not on file    family history includes Hypertension in her father and mother; Kidney disease in her father; Stroke in her mother.  Wt Readings from Last 3 Encounters:  07/17/17 212 lb 12.8 oz (96.5 kg)  01/08/17 214 lb (97.1 kg)  01/06/17 214 lb (97.1 kg)    PHYSICAL EXAM BP (!) 144/90   Pulse 69   Ht 5\' 6"  (1.676 m)   Wt 212 lb 12.8 oz (96.5 kg)   BMI 34.35 kg/m  Physical Exam  Constitutional: She is oriented to person, place, and time. She appears well-developed and well-nourished. No distress.  Moderately obese.  Well-groomed  HENT:  Head: Normocephalic and atraumatic.  Neck: Normal range of motion. Neck supple. No hepatojugular reflux and no JVD present. Carotid bruit is not present.  Cardiovascular: Normal rate, regular rhythm, normal heart sounds, intact distal pulses and normal pulses.  No extrasystoles are present. PMI is not displaced. Exam reveals no gallop and no friction  rub.  No murmur heard. Pulmonary/Chest: Effort normal and breath sounds normal. No respiratory distress. She has no wheezes. She has no rales.  Abdominal: Soft. Bowel sounds are normal. She exhibits no distension. There is no tenderness.  Musculoskeletal: Normal range of motion. She exhibits no edema.  Lymphadenopathy:    She has no cervical adenopathy.  Neurological: She is alert and oriented to person, place, and time.  Psychiatric: She has a normal mood and affect. Her behavior is normal. Judgment and thought content normal.     Adult ECG Report n/a  Other studies Reviewed: Additional studies/ records that were reviewed today include:  Recent Labs:  Labs checked by PCP last visit in DEc 2018 (reviwed in [place]) Lab Results  Component Value 6/'20 April 2017   CHOL 102 (L) 130   HDL 40 (L) 35   LDLCALC 50 Direct LDL 86   TRIG 61 95    ASSESSMENT / PLAN: Problem List Items Addressed This Visit    Mild valvular heart disease    Nothing heard on exam.  Minimal MR on echo.  At this point not an active issue.      Relevant Medications   hydrochlorothiazide (HYDRODIURIL) 12.5 MG tablet   nitroGLYCERIN (NITROSTAT) 0.4 MG SL tablet   Hyperlipidemia with target LDL less than 70 (Chronic)    Her lipid panel looks a little worse this year than last.  Direct LDL was reported as opposed to calculate LDL.  --She remains on 40 mg of atorvastatin discussed dietary modification and continued followed up..  if it does appear the LDL is going up, then she may require additional medication.        Relevant Medications   hydrochlorothiazide (HYDRODIURIL) 12.5 MG tablet   nitroGLYCERIN (NITROSTAT) 0.4 MG SL tablet   History of non-STEMI (Chronic)    History of non-STEMI with two-vessel PCI no active anginal heart failure symptoms.  No significant regional wall motion normality noted on echo and no infarct noted on Myoview.      Essential hypertension (Chronic)    Upper limit of  normal blood pressure today with a diastolic pressure of 90.  We will restart low-dose HCTZ.  Apparently she stopped taking it because of frequent urination.  I will have her  restart at 1/2 dose (12.5 mg).      Relevant Medications   hydrochlorothiazide (HYDRODIURIL) 12.5 MG tablet   nitroGLYCERIN (NITROSTAT) 0.4 MG SL tablet   CAD S/P percutaneous coronary angioplasty (Chronic)    2/12 years s/p 2 Vessel PCI in setting of NSTEMI - No recurrent Anginal CP & recent Myoview with no evidence of ischemia or infarction.  Continue Brilinta at low-dose without aspirin. --After next visit, would likely be able to stop antiplatelet agent.  Return to aspirin.  Is okay to hold Brilinta for procedures.  Continue low-dose beta-blocker and benazepril.  Continue statin.      Relevant Medications   hydrochlorothiazide (HYDRODIURIL) 12.5 MG tablet   nitroGLYCERIN (NITROSTAT) 0.4 MG SL tablet      Current medicines are reviewed at length with the patient today. (+/- concerns) not taking HCTZ b/s freq diuresis. The following changes have been made: restart @ 1/2 dose  Patient Instructions  Medication Instructions: DECREASE the Hydrochlorothiazide 12.5 mg tablet daily  If you need a refill on your cardiac medications before your next appointment, please call your pharmacy.    Follow-Up: Your physician wants you to follow-up in 6 months with Almyra Deforest, PA and 12 months with Dr. Ellyn Hack.  You will receive a reminder letter in the mail two months in advance. If you don't receive a letter, please call our office at [phone] to schedule this follow-up appointment.   Thank you for choosing Heart[facility] at [facility]!!        Studies Ordered:   No orders of the defined types were placed in this encounter.     Glenetta Hew, M.D., M.S. Interventional Cardiologist   Pager # [phone] Phone # [phone] [address] [place], [place] [address]   Thank you for choosing  Heart[facility] at [facility]!!

## 2017-07-17 NOTE — Patient Instructions (Addendum)
Medication Instructions: DECREASE the Hydrochlorothiazide 12.5 mg tablet daily  If you need a refill on your cardiac medications before your next appointment, please call your pharmacy.    Follow-Up: Your physician wants you to follow-up in 6 months with Almyra Deforest, PA and 12 months with Dr. Ellyn Hack.  You will receive a reminder letter in the mail two months in advance. If you don't receive a letter, please call our office at [phone] to schedule this follow-up appointment.   Thank you for choosing Heartcare at [facility]!!

## 2017-07-20 ENCOUNTER — Encounter: Payer: Self-pay | Admitting: Cardiology

## 2017-07-20 NOTE — Assessment & Plan Note (Signed)
Her lipid panel looks a little worse this year than last.  Direct LDL was reported as opposed to calculate LDL.  --She remains on 40 mg of atorvastatin discussed dietary modification and continued followed up..  if it does appear the LDL is going up, then she may require additional medication.

## 2017-07-20 NOTE — Assessment & Plan Note (Addendum)
2/12 years s/p 2 Vessel PCI in setting of NSTEMI - No recurrent Anginal CP & recent Myoview with no evidence of ischemia or infarction.  Continue Brilinta at low-dose without aspirin. --After next visit, would likely be able to stop antiplatelet agent.  Return to aspirin.  Is okay to hold Brilinta for procedures.  Continue low-dose beta-blocker and benazepril.  Continue statin.

## 2017-07-20 NOTE — Assessment & Plan Note (Addendum)
Upper limit of normal blood pressure today with a diastolic pressure of 90.  We will restart low-dose HCTZ.  Apparently she stopped taking it because of frequent urination.  I will have her restart at 1/2 dose (12.5 mg).

## 2017-07-20 NOTE — Assessment & Plan Note (Signed)
The patient understands the need to lose weight with diet and exercise. We have discussed specific strategies for this.  

## 2017-07-20 NOTE — Assessment & Plan Note (Signed)
Nothing heard on exam.  Minimal MR on echo.  At this point not an active issue.

## 2017-07-20 NOTE — Assessment & Plan Note (Signed)
History of non-STEMI with two-vessel PCI no active anginal heart failure symptoms.  No significant regional wall motion normality noted on echo and no infarct noted on Myoview.

## 2017-07-22 ENCOUNTER — Telehealth: Payer: Self-pay | Admitting: *Deleted

## 2017-07-22 NOTE — Telephone Encounter (Addendum)
STARTED PRIOR AUTHORIZATION FOR BRILINTA 60 MG TWICE A DAY - LOWER TIER EXCEPTION REQUEST CAME FROM PHARMACY  [facility]  KEY # [place] DOB [id number]  AWAITING ON RESPONSE

## 2017-07-31 NOTE — Telephone Encounter (Signed)
per cover mymed - prior aut. is not needed  pharmacy aware . States patient has picked up medications

## 2017-11-20 ENCOUNTER — Other Ambulatory Visit: Payer: Self-pay | Admitting: Cardiology

## 2017-11-22 ENCOUNTER — Other Ambulatory Visit: Payer: Self-pay | Admitting: Cardiology

## 2017-12-11 ENCOUNTER — Other Ambulatory Visit: Payer: Self-pay | Admitting: Oncology

## 2017-12-11 ENCOUNTER — Telehealth: Payer: Self-pay | Admitting: Oncology

## 2017-12-11 DIAGNOSIS — C50412 Malignant neoplasm of upper-outer quadrant of left female breast: Secondary | ICD-10-CM

## 2017-12-11 NOTE — Telephone Encounter (Signed)
Called pt re appts that were added per 8/9 sch msg - left vm for pt re appts.  °

## 2017-12-18 ENCOUNTER — Inpatient Hospital Stay: Payer: Medicare Other

## 2017-12-18 ENCOUNTER — Encounter: Payer: Self-pay | Admitting: Adult Health

## 2017-12-18 ENCOUNTER — Inpatient Hospital Stay: Payer: Medicare Other | Attending: Adult Health | Admitting: Adult Health

## 2017-12-18 ENCOUNTER — Telehealth: Payer: Self-pay | Admitting: Adult Health

## 2017-12-18 VITALS — BP 164/89 | HR 83 | Temp 98.2°F | Resp 18 | Ht 66.0 in | Wt 211.3 lb

## 2017-12-18 DIAGNOSIS — Z853 Personal history of malignant neoplasm of breast: Secondary | ICD-10-CM | POA: Diagnosis present

## 2017-12-18 DIAGNOSIS — C50412 Malignant neoplasm of upper-outer quadrant of left female breast: Secondary | ICD-10-CM

## 2017-12-18 DIAGNOSIS — I890 Lymphedema, not elsewhere classified: Secondary | ICD-10-CM | POA: Diagnosis not present

## 2017-12-18 LAB — CBC WITH DIFFERENTIAL/PLATELET
BASOS ABS: 0 10*3/uL (ref 0.0–0.1)
BASOS PCT: 0 %
EOS ABS: 0.2 10*3/uL (ref 0.0–0.5)
Eosinophils Relative: 4 %
HEMATOCRIT: 39.7 % (ref 34.8–46.6)
Hemoglobin: 12.5 g/dL (ref 11.6–15.9)
Lymphocytes Relative: 13 %
Lymphs Abs: 0.6 10*3/uL — ABNORMAL LOW (ref 0.9–3.3)
MCH: 28 pg (ref 25.1–34.0)
MCHC: 31.5 g/dL (ref 31.5–36.0)
MCV: 89 fL (ref 79.5–101.0)
MONO ABS: 0.5 10*3/uL (ref 0.1–0.9)
Monocytes Relative: 11 %
NEUTROS ABS: 3.6 10*3/uL (ref 1.5–6.5)
NEUTROS PCT: 72 %
Platelets: 239 10*3/uL (ref 145–400)
RBC: 4.46 MIL/uL (ref 3.70–5.45)
RDW: 13.4 % (ref 11.2–14.5)
WBC: 5 10*3/uL (ref 3.9–10.3)

## 2017-12-18 LAB — COMPREHENSIVE METABOLIC PANEL
ALK PHOS: 82 U/L (ref 38–126)
ALT: 15 U/L (ref 0–44)
ANION GAP: 8 (ref 5–15)
AST: 17 U/L (ref 15–41)
Albumin: 3.7 g/dL (ref 3.5–5.0)
BILIRUBIN TOTAL: 0.6 mg/dL (ref 0.3–1.2)
BUN: 16 mg/dL (ref 8–23)
CALCIUM: 10 mg/dL (ref 8.9–10.3)
CO2: 28 mmol/L (ref 22–32)
CREATININE: 0.94 mg/dL (ref 0.44–1.00)
Chloride: 107 mmol/L (ref 98–111)
GFR calc non Af Amer: >60 mL/min (ref >60)
Glucose, Bld: 104 mg/dL — ABNORMAL HIGH (ref 70–99)
Potassium: 3.7 mmol/L (ref 3.5–5.1)
Sodium: 143 mmol/L (ref 135–145)
TOTAL PROTEIN: 6.9 g/dL (ref 6.5–8.1)

## 2017-12-18 NOTE — Telephone Encounter (Signed)
Gave avs and calendar ° °

## 2017-12-18 NOTE — Progress Notes (Signed)
ID: Brooke Proctor OB: 02-27-1951  MR#: [id number]  GDJ#:[id number]  PCP: Nicola Girt, DO  Dr Mali Snow GYN:   SU:  OTHER MD:   CHIEF COMPLAINT: "I used to be followed by Dr. Mercie Eon, but she left."  HISTORY OF PRESENT ILLNESS: Brooke Proctor developed pain in her left breast sometime in the spring of 2009. She had mammography at [facility] around that time, but it was nondiagnostic. Soon thereafter she retired and moved to [place] [employer]. As the pain persisted, she was evaluated there and repeat left mammography in July of 2009 showed an abnormality requiring further evaluation. She had ultrasound and MRI which showed a suspicious lesion in the upper outer quadrant of the left breast. 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 The tumor was an invasive ductal carcinoma, grade 3, measuring 1.5 cm. It involved 1 of 13 axillary lymph nodes. 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  Adjuvantly the patient was treated by Dr. Bruna Potter with 6 cycles of cyclophosphamide and docetaxel. She then received adjuvant radiation therapy, completed in February of 2000.    Her subsequent history is as detailed below  INTERVAL HISTORY: Brooke Proctor is here today for follow up of her triple negative breast cancer.  She is doing moderately well today.  She doe shave some intermittent pain under her left arm.  She denies a pronounced pain in her right breast.  She has new onset left arm swelling, she has a h/o lymphedema and her sleeve is too small.  She has not been to PT in a while.   Brooke Proctor sees Brooke Proctor her PCP regularly.  She walks a lot, but notes she would enjoy getting back in the gym.  She would also like a script for her bras.  She cannot recall where she had her last mammogram.  I cannot locate it in Epic.  She will find it out and get Brooke Proctor a copy for our records.  She tells me it  was done this year.    REVIEW OF SYSTEMS: Other than what is noted above, [name] is doing well.  She denies any headaches, vision changes, fatigue, night sweats or unintentional weight loss.  She is without chest pain, palpitations, shortness of breath, or cough.  She denies nausea, vomiting, constipation, diarrhea, dysphagia.  Other than what is noted in the interval history, a detailed ROS was non contributory today.    PAST MEDICAL HISTORY: Past Medical History:  Diagnosis Date  . Breast cancer ([place])   . CAD S/P percutaneous coronary angioplasty    a. NSTEMI 09/2014: s/p DES to mLAD and mRCA. Normal EF.  . Essential hypertension   . History of radiation therapy   . Hyperlipidemia   . Mild valvular heart disease    a. Echo 09/2014: mild AI/MR.  . NSTEMI (non-ST elevated myocardial infarction) ([place]) 09/19/2014  . Obesity   . Status post chemotherapy   . Thyroid disease    Peripheral neuropathy secondary to chemotherapy, history of vertigo, history of low vitamin D, history of hypothyroidism  PAST SURGICAL HISTORY: Past Surgical History:  Procedure Laterality Date  . CARDIAC CATHETERIZATION N/A 09/20/2014   Procedure: Left Heart Cath and Coronary Angiography;  Surgeon: Peter M Martinique, MD;  Location: [facility];  Service: Cardiovascular; 80% mLAD, 90% mRCA  . CARDIAC CATHETERIZATION  09/20/2014   Procedure: Coronary Stent Intervention;  Surgeon: Peter M Martinique, MD;  Location: [facility];  Service: Cardiovascular;;mLAD Promus Premeir DES 2.5 mm x 16 mm , mRCA - Promus Premier DES 4.0 mm x 16 mm (4.5 mm)   . CARDIAC EVENT MONITOR  10/2015   SR & S Tachycardia. Occasional PVCs.  No arrhythmias  . LYMPH NODE DISSECTION    . NM MYOVIEW LTD  01/2017   Normal/low risk study.  No ischemia or infarction.  EF 67%.  . TRANSTHORACIC ECHOCARDIOGRAM  09/21/2014   Normal LV Size, mild LVH.  EF 60-65%, Gr 1 DD, mild AI & MR   Status post foot surgery, shoulder arthroscopy, simple  hysterectomy in her 65s (no salpingo-oophorectomy and of course history of left breast surgery  FAMILY HISTORY Family History  Problem Relation Age of Onset  . Hypertension Mother   . Stroke Mother   . Hypertension Father   . Kidney disease Father   . CAD Neg Hx    The patient's father died from renal failure at the age of 58. Her mother is living, age 12. The patient had one brother, no sisters. There is no history of breast or ovarian cancer in the family  GYNECOLOGIC HISTORY:   menarche age 70, first live birth age 67, she is GX P3. She underwent hysterectomy approximately age 50. She did not undergo salpingo-oophorectomy. She did not use hormone replacement.   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 She is divorced and lives alone, with no pets. Son Brooke Saxon "Darrell" arms Junior works as a Quarry manager in [place]. Daughter Brooke Proctor works for [employer] as an Programme researcher, broadcasting/film/video in [place]. Daughter Brooke Proctor lives in [place], [place] where she works as an Solicitor. The patient has one granddaughter, unfortunately the subject of a custom to battle currently. She attends a new [employer] locally    ADVANCED DIRECTIVES:  in place. The patient has named her lawyer, Modena Nunnery, as her healthcare power of attorney    HEALTH MAINTENANCE: Social History   Tobacco Use  . Smoking status: Never Smoker  . Smokeless tobacco: Never Used  Substance Use Topics  . Alcohol use: No    Alcohol/week: 0.0 standard drinks  . Drug use: No     Colonoscopy: 2010  PAP: Status post hysterectomy   Bone density: 2011/ osteopenia per patient report  Lipid panel:  Allergies  Allergen Reactions  . Eggs Or Egg-Derived Products Swelling    ALLERGY: EGG WHITES  . Nickel Swelling and Other (See Comments)    REACTION:"BLISTERS"  . Adhesive [Tape] Itching  . Latex Itching    Current Outpatient Medications  Medication Sig  Dispense Refill  . acetaminophen (TYLENOL) 325 MG tablet Take 2 tablets (650 mg total) by mouth every 4 (four) hours as needed for headache or mild pain.    Marland Kitchen atorvastatin (LIPITOR) 40 MG tablet TAKE 1 TABLET(40 MG) BY MOUTH AT BEDTIME 90 tablet 1  . benazepril (LOTENSIN) 20 MG tablet TAKE 1 TABLET(20 MG) BY MOUTH DAILY 90 tablet 2  . Biotin 1000 MCG tablet Take 1,000 mcg by mouth daily.    Marland Kitchen BRILINTA 60 MG TABS tablet TAKE 1 TABLET(60 MG) BY MOUTH TWICE DAILY 180 tablet 2  . celecoxib (CELEBREX) 200 MG capsule Take 1 capsule by mouth as directed.  0  . ergocalciferol (VITAMIN D2) 50000 UNITS capsule Take 50,000 Units by mouth once a week. Take on sundays    .  hydrochlorothiazide (HYDRODIURIL) 12.5 MG tablet Take 1 tablet (12.5 mg total) by mouth daily. 90 tablet 3  . levothyroxine (SYNTHROID, LEVOTHROID) 100 MCG tablet Take 100 mcg by mouth daily before breakfast.    . metoprolol succinate (TOPROL-XL) 25 MG 24 hr tablet TAKE 1 TABLET(25 MG) BY MOUTH DAILY 90 tablet 1  . nitroGLYCERIN (NITROSTAT) 0.4 MG SL tablet PLACE 1 TABLET UNDER THE TONGUE EVERY 5 MINUTES FOR 3 DOSES AS NEEDED FOR CHEST PAIN 25 tablet 2  . potassium chloride SA (K-DUR,KLOR-CON) 20 MEQ tablet Take 1 tablet (20 mEq total) by mouth daily. 90 tablet 3  . promethazine (PHENERGAN) 25 MG tablet Take 25 mg by mouth every 8 (eight) hours as needed for nausea or vomiting.     No current facility-administered medications for this visit.     OBJECTIVE: Middle-aged [place] American woman who appears stated age  67:   12/18/17 1333  BP: (!) 164/89  Pulse: 83  Resp: 18  Temp: 98.2 F (36.8 C)  SpO2: 100%     Body mass index is 34.1 kg/m.    ECOG FS:1 - Symptomatic but completely ambulatory GENERAL: Patient is a well appearing female in no acute distress HEENT:  Sclerae anicteric.  Oropharynx clear and moist. No ulcerations or evidence of oropharyngeal candidiasis. Neck is supple.  NODES:  No cervical, supraclavicular, or  axillary lymphadenopathy palpated.  BREAST EXAM:  Left breast s/p lumpectomy, mild amt of scar tissue present, no nodules or masses noted, right breast without nodes, masses, skin or nipple changes.  No sign of recurrence, benign breast exam LUNGS:  Clear to auscultation bilaterally.  No wheezes or rhonchi. HEART:  Regular rate and rhythm. No murmur appreciated. ABDOMEN:  Soft, nontender.  Positive, normoactive bowel sounds. No organomegaly palpated. MSK:  No focal spinal tenderness to palpation. Full range of motion bilaterally in the upper extremities. EXTREMITIES:  + left arm swelling  SKIN:  Clear with no obvious rashes or skin changes. No nail dyscrasia. NEURO:  Nonfocal. Well oriented.  Appropriate affect.    LAB RESULTS:  CMP     Component Value Date/Time   NA 143 12/18/2017 1310   NA 142 04/19/2013 1617   K 3.7 12/18/2017 1310   K 3.8 04/19/2013 1617   CL 107 12/18/2017 1310   CO2 28 12/18/2017 1310   CO2 26 04/19/2013 1617   GLUCOSE 104 (H) 12/18/2017 1310   GLUCOSE 98 04/19/2013 1617   BUN 16 12/18/2017 1310   BUN 13.8 04/19/2013 1617   CREATININE 0.94 12/18/2017 1310   CREATININE 0.89 02/09/2017 0918   CREATININE 0.8 04/19/2013 1617   CALCIUM 10.0 12/18/2017 1310   CALCIUM 9.8 04/19/2013 1617   PROT 6.9 12/18/2017 1310   PROT 7.3 04/19/2013 1617   ALBUMIN 3.7 12/18/2017 1310   ALBUMIN 3.7 04/19/2013 1617   AST 17 12/18/2017 1310   AST 17 04/19/2013 1617   ALT 15 12/18/2017 1310   ALT 14 04/19/2013 1617   ALKPHOS 82 12/18/2017 1310   ALKPHOS 72 04/19/2013 1617   BILITOT 0.6 12/18/2017 1310   BILITOT 0.44 04/19/2013 1617   GFRNONAA >60 12/18/2017 1310   GFRNONAA 68 02/09/2017 0918   GFRAA >60 12/18/2017 1310   GFRAA 78 02/09/2017 0918    I No results found for: SPEP  Lab Results  Component Value Date   WBC 5.0 12/18/2017   NEUTROABS 3.6 12/18/2017   HGB 12.5 12/18/2017   HCT 39.7 12/18/2017   MCV 89.0 12/18/2017   PLT  239 12/18/2017       Chemistry      Component Value Date/Time   NA 143 12/18/2017 1310   NA 142 04/19/2013 1617   K 3.7 12/18/2017 1310   K 3.8 04/19/2013 1617   CL 107 12/18/2017 1310   CO2 28 12/18/2017 1310   CO2 26 04/19/2013 1617   BUN 16 12/18/2017 1310   BUN 13.8 04/19/2013 1617   CREATININE 0.94 12/18/2017 1310   CREATININE 0.89 02/09/2017 0918   CREATININE 0.8 04/19/2013 1617      Component Value Date/Time   CALCIUM 10.0 12/18/2017 1310   CALCIUM 9.8 04/19/2013 1617   ALKPHOS 82 12/18/2017 1310   ALKPHOS 72 04/19/2013 1617   AST 17 12/18/2017 1310   AST 17 04/19/2013 1617   ALT 15 12/18/2017 1310   ALT 14 04/19/2013 1617   BILITOT 0.6 12/18/2017 1310   BILITOT 0.44 04/19/2013 1617       No results found for: LABCA2  No components found for: LABCA125  No results for input(s): INR in the last 168 hours.  Urinalysis No results found for: COLORURINE  STUDIES: Mm Digital Diagnostic Bilat  04/14/2013   CLINICAL DATA:  Status post left lumpectomy for breast cancer in 2009.  EXAM: DIGITAL DIAGNOSTIC  BILATERAL MAMMOGRAM WITH CAD  COMPARISON:  11/13/2008. More recent examinations at [facility] are being obtained for comparison.  ACR Breast Density Category c: The breasts are heterogeneously dense, which may obscure small masses.  FINDINGS: Post lumpectomy changes on the left with progressive calcifications compatible with fat necrosis in the upper outer left breast. There is a stellate density at the location of the previously seen postoperative seroma. No findings elsewhere in either breast suspicious for malignancy.  Mammographic images were processed with CAD.  IMPRESSION: Probable normal postlumpectomy scar in the upper outer left breast. Once the patient's previous mammograms from [facility] are obtained comparison, a report addendum with followup recommendations will be issued.  RECOMMENDATION: Comparison with previous studies.  I have  discussed the findings and recommendations with the patient. Results were also provided in writing at the conclusion of the visit. If applicable, a reminder letter will be sent to the patient regarding the next appointment.  BI-RADS CATEGORY  0: Incomplete. Need additional imaging evaluation and/or prior mammograms for comparison.   Electronically Signed   By: Enrique Sack M.D.   On: 04/14/2013 08:37    ASSESSMENT: 67 y.o. [place] woman  (1) status post left lumpectomy and axillary lymph node dissection 12/03/2007 for a pT1c pNi, stage IIA invasive ductal carcinoma, grade 3, triple negative, with an MIB-1 of 58%.  (2) status post cyclophosphamide and docetaxel x6  (3) status post adjuvant radiation completed February of 2010.  PLAN: Krystian is doing well today.  She has no clinical sign of recurrence.  I asked her to get Brooke Proctor a copy of her mammogram for our records.  The last one we have on record was in 2014.  She is experiencing lymphedema.  I did write for her another sleeve, and for some more compression bras.  I also referred her back to physical therapy to help her.  We reviewed briefly that Cherity had a difficult time getting in here for assistance in getting to the right place for her lymphedema.  Since this is a chronic issue, I suggested she consider participating in our long term survivorship program.  She is interested and will participate.  I recommended healthy  diet and exercise to her today.  She will return in one year for LTS follow up.   Raileigh knows to call for any questions or concerns prior to her next appointment with Brooke Proctor.    A total of (30) minutes of face-to-face time was spent with this patient with greater than 50% of that time in counseling and care-coordination.   Scot Dock, NP   12/18/2017 2:09 PM

## 2017-12-28 DIAGNOSIS — T753XXA Motion sickness, initial encounter: Secondary | ICD-10-CM | POA: Insufficient documentation

## 2017-12-30 ENCOUNTER — Ambulatory Visit: Payer: Medicare Other | Attending: Adult Health | Admitting: Rehabilitation

## 2018-01-12 ENCOUNTER — Encounter

## 2018-01-20 ENCOUNTER — Other Ambulatory Visit: Payer: Self-pay | Admitting: *Deleted

## 2018-01-20 MED ORDER — TICAGRELOR 60 MG PO TABS: 60.0000 mg | ORAL_TABLET | Freq: Two times a day (BID) | ORAL | 1 refills | Status: DC

## 2018-01-27 ENCOUNTER — Inpatient Hospital Stay: Admit: 2018-01-27 | Payer: MEDICARE | Primary: Internal Medicine

## 2018-01-27 DIAGNOSIS — Z13820 Encounter for screening for osteoporosis: Secondary | ICD-10-CM

## 2018-02-03 ENCOUNTER — Other Ambulatory Visit: Payer: Self-pay | Admitting: Physician Assistant

## 2018-03-04 DIAGNOSIS — R310 Gross hematuria: Secondary | ICD-10-CM | POA: Insufficient documentation

## 2018-05-08 ENCOUNTER — Other Ambulatory Visit: Payer: Self-pay | Admitting: Physician Assistant

## 2018-05-28 ENCOUNTER — Other Ambulatory Visit: Payer: Self-pay | Admitting: Cardiology

## 2018-05-28 MED ORDER — POTASSIUM CHLORIDE CRYS ER 20 MEQ PO TBCR: 20.0000 meq | EXTENDED_RELEASE_TABLET | Freq: Every day | ORAL | 0 refills | Status: DC

## 2018-05-28 NOTE — Telephone Encounter (Signed)
°*  STAT* If patient is at the pharmacy, call can be transferred to refill team.   1. Which medications need to be refilled? (please list name of each medication and dose if known) Potassium  2. Which pharmacy/location (including street and city if local pharmacy) is medication to be sent to [facility] [place]  3. Do they need a 30 day or 90 day supply? 90 days and refills

## 2018-06-04 ENCOUNTER — Ambulatory Visit: Payer: Medicare Other | Admitting: Physician Assistant

## 2018-06-04 ENCOUNTER — Encounter: Payer: Self-pay | Admitting: Physician Assistant

## 2018-06-04 VITALS — BP 140/78 | HR 84 | Ht 66.0 in | Wt 210.6 lb

## 2018-06-04 DIAGNOSIS — I10 Essential (primary) hypertension: Secondary | ICD-10-CM | POA: Diagnosis not present

## 2018-06-04 DIAGNOSIS — I2510 Atherosclerotic heart disease of native coronary artery without angina pectoris: Secondary | ICD-10-CM

## 2018-06-04 DIAGNOSIS — R0600 Dyspnea, unspecified: Secondary | ICD-10-CM

## 2018-06-04 DIAGNOSIS — E039 Hypothyroidism, unspecified: Secondary | ICD-10-CM

## 2018-06-04 DIAGNOSIS — I4510 Unspecified right bundle-branch block: Secondary | ICD-10-CM

## 2018-06-04 DIAGNOSIS — R011 Cardiac murmur, unspecified: Secondary | ICD-10-CM

## 2018-06-04 NOTE — Progress Notes (Signed)
Cardiology Office Note    Date:  06/07/2018   ID:  Brooke Proctor, Ken 15-Mar-1951, MRN [id number]  PCP:  Nicola Girt, DO  Cardiologist:  Dr. Ellyn Hack   Chief Complaint  Patient presents with  . Follow-up    seen for Dr. Ellyn Hack    History of Present Illness:  Brooke Proctor is a 68 y.o. female with PMH of HTN, breast CA, obesity, hypothyroidism, HLD and CAD. He had NSTEMI in May 2016 and had PCI of LAD and RCA. Cardiac catheterization performed on 09/20/2014 showed 20% proximal to mid LAD, 50% OM1, 50% OM 2, 90% mid LAD lesion treated with DES, 80% mid RCA lesion treated with DES complicated by transient no flow phenomenon. Echocardiogram obtained on the following day showed EF 60-65%, mild LVH, grade 1 DD, mild AI/MR. She also had a 2 week event monitor in June 2017 that showed sinus rhythm with sinus tachycardia of unknown arrhythmia.  She had a stress test on 01/08/2017 for preop clearance that was low risk, EF 67%, no ischemia or scar.  She recently passed a kidney stone and had gross hematuria as result of that.  Urine culture was negative.  This is being followed by urology service at [facility].  Patient presents today for cardiology office visit.  She says sometimes when she is trying to lay down at night, she may have 2 seconds of shortness of breath before resolving.  She also notices occasional shortness of breath with exertion.  However the shortness of the breath does not correlate with the degree of exertion.  She said she occasionally have shortness of breath when walking to her car to the parking lot, however she can exercise on the stationary bicycle in the gym without any issue.  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  Furthermore, she has a loud murmur near the mitral valve area.  I plan to obtain an echocardiogram.  Last CBC obtained by her urologist on 02/27/2018 was negative for  significant anemia.   Past Medical History:  Diagnosis Date  . Breast cancer ([place])   . CAD S/P percutaneous coronary angioplasty    a. NSTEMI 09/2014: s/p DES to mLAD and mRCA. Normal EF.  . Essential hypertension   . History of radiation therapy   . Hyperlipidemia   . Mild valvular heart disease    a. Echo 09/2014: mild AI/MR.  . NSTEMI (non-ST elevated myocardial infarction) ([place]) 09/19/2014  . Obesity   . Status post chemotherapy   . Thyroid disease     Past Surgical History:  Procedure Laterality Date  . CARDIAC CATHETERIZATION N/A 09/20/2014   Procedure: Left Heart Cath and Coronary Angiography;  Surgeon: Peter M Martinique, MD;  Location: [place] [facility];  Service: Cardiovascular; 80% mLAD, 90% mRCA  . CARDIAC CATHETERIZATION  09/20/2014   Procedure: Coronary Stent Intervention;  Surgeon: Peter M Martinique, MD;  Location: [place] [facility];  Service: Cardiovascular;;mLAD Promus Premeir DES 2.5 mm x 16 mm , mRCA - Promus Premier DES 4.0 mm x 16 mm (4.5 mm)   . CARDIAC EVENT MONITOR  10/2015   SR & S Tachycardia. Occasional PVCs.  No arrhythmias  . LYMPH NODE DISSECTION    . NM MYOVIEW LTD  01/2017   Normal/low risk study.  No ischemia or infarction.  EF 67%.  . TRANSTHORACIC ECHOCARDIOGRAM  09/21/2014   Normal LV Size, mild LVH.  EF  60-65%, Gr 1 DD, mild AI & MR    Current Medications: Outpatient Medications Prior to Visit  Medication Sig Dispense Refill  . acetaminophen (TYLENOL) 325 MG tablet Take 2 tablets (650 mg total) by mouth every 4 (four) hours as needed for headache or mild pain.    Marland Kitchen atorvastatin (LIPITOR) 40 MG tablet TAKE 1 TABLET(40 MG) BY MOUTH AT BEDTIME 90 tablet 1  . benazepril (LOTENSIN) 20 MG tablet TAKE 1 TABLET(20 MG) BY MOUTH DAILY 90 tablet 2  . Biotin 1000 MCG tablet Take 1,000 mcg by mouth daily.    . celecoxib (CELEBREX) 200 MG capsule Take 1 capsule by mouth as directed.  0  . ergocalciferol (VITAMIN D2) 50000 UNITS capsule Take 50,000 Units by mouth  once a week. Take on sundays    . hydrochlorothiazide (HYDRODIURIL) 12.5 MG tablet Take 1 tablet (12.5 mg total) by mouth daily. 90 tablet 3  . levothyroxine (SYNTHROID, LEVOTHROID) 100 MCG tablet Take 100 mcg by mouth daily before breakfast.    . metoprolol succinate (TOPROL-XL) 25 MG 24 hr tablet TAKE 1 TABLET(25 MG) BY MOUTH DAILY 90 tablet 1  . potassium chloride SA (K-DUR,KLOR-CON) 20 MEQ tablet Take 1 tablet (20 mEq total) by mouth daily. KEEP OV. 90 tablet 0  . promethazine (PHENERGAN) 25 MG tablet Take 25 mg by mouth every 8 (eight) hours as needed for nausea or vomiting.    . ticagrelor (BRILINTA) 60 MG TABS tablet Take 1 tablet (60 mg total) by mouth 2 (two) times daily. 180 tablet 1  . nitroGLYCERIN (NITROSTAT) 0.4 MG SL tablet PLACE 1 TABLET UNDER THE TONGUE EVERY 5 MINUTES FOR 3 DOSES AS NEEDED FOR CHEST PAIN (Patient not taking: Reported on 06/04/2018) 25 tablet 2   No facility-administered medications prior to visit.      Allergies:   Eggs or egg-derived products; Nickel; Adhesive [tape]; and Latex   Social History   Socioeconomic History  . Marital status: Divorced    Spouse name: Not on file  . Number of children: Not on file  . Years of education: Not on file  . Highest education level: Not on file  Occupational History  . Not on file  Social Needs  . Financial resource strain: Not on file  . Food insecurity:    Worry: Not on file    Inability: Not on file  . Transportation needs:    Medical: Not on file    Non-medical: Not on file  Tobacco Use  . Smoking status: Never Smoker  . Smokeless tobacco: Never Used  Substance and Sexual Activity  . Alcohol use: No    Alcohol/week: 0.0 standard drinks  . Drug use: No  . Sexual activity: Not on file  Lifestyle  . Physical activity:    Days per week: Not on file    Minutes per session: Not on file  . Stress: Not on file  Relationships  . Social connections:    Talks on phone: Not on file    Gets together: Not on  file    Attends religious service: Not on file    Active member of club or organization: Not on file    Attends meetings of clubs or organizations: Not on file    Relationship status: Not on file  Other Topics Concern  . Not on file  Social History Narrative  . Not on file     Family History:  The patient's family history includes Hypertension in her father and mother; Kidney  disease in her father; Stroke in her mother.   ROS:   Please see the history of present illness.    ROS All other systems reviewed and are negative.   PHYSICAL EXAM:   VS:  BP 140/78   Pulse 84   Ht 5\' 6"  (1.676 m)   Wt 210 lb 9.6 oz (95.5 kg)   BMI 33.99 kg/m    GEN: Well nourished, well developed, in no acute distress  HEENT: normal  Neck: no JVD, carotid bruits, or masses Cardiac: RRR; no rubs, or gallops,no edema  3/6 systolic murmur Respiratory:  clear to auscultation bilaterally, normal work of breathing GI: soft, nontender, nondistended, + BS MS: no deformity or atrophy  Skin: warm and dry, no rash Neuro:  Alert and Oriented x 3, Strength and sensation are intact Psych: euthymic mood, full affect  Wt Readings from Last 3 Encounters:  06/04/18 210 lb 9.6 oz (95.5 kg)  12/18/17 211 lb 4.8 oz (95.8 kg)  07/17/17 212 lb 12.8 oz (96.5 kg)      Studies/Labs Reviewed:   EKG:  EKG is ordered today.  The ekg ordered today demonstrates sinus rhythm, right bundle branch block  Recent Labs: 12/18/2017: ALT 15; BUN 16; Creatinine, Ser 0.94; Hemoglobin 12.5; Platelets 239; Potassium 3.7; Sodium 143   Lipid Panel    Component Value Date/Time   CHOL 102 (L) 10/23/2015 1105   TRIG 61 10/23/2015 1105   HDL 40 (L) 10/23/2015 1105   CHOLHDL 2.6 10/23/2015 1105   VLDL 12 10/23/2015 1105   LDLCALC 50 10/23/2015 1105    Additional studies/ records that were reviewed today include:   Myoview 01/08/2017 Study Highlights    Normal perfusion No ischemia or scar  Nuclear stress EF: 67%.  The study  is normal.  This is a low risk study.         ASSESSMENT:    1. Dyspnea, unspecified type   2. Heart murmur   3. Essential hypertension   4. Coronary artery disease involving native coronary artery of native heart without angina pectoris   5. Hypothyroidism, unspecified type   6. RBBB      PLAN:  In order of problems listed above:  1. Dyspnea: Her dyspnea only seems to be occurring the moment she is trying to lay down, this only last about 2 seconds before resolving.  She can sleep the rest of the night without any issue.  Her dyspnea also does not correlate with exertion either.  Sometimes she has dyspnea walking to the parking lot, whereas other times she can ride a stationary bicycle without any issue.  She does have a heart murmur on physical exam, again her dyspnea does not seems to be correlating with exertion, therefore I am not sure if this is an incidental finding  2. Heart murmur: Appears to be near the mitral area, she has never been told she had a heart murmur in the past.  The heart murmur is actually quite loud, 3 out of 6 in intensity.  3. Right bundle branch block: She had incomplete right bundle branch block in the past, this has turned into a complete right bundle branch block by today's EKG.  4. CAD: Previous cardiac catheterization was in 2016, no recent chest discomfort.  She described her previous anginal symptom as a epigastric and substernal xyphoid pain radiating to the back.  There has been no recurrence since then.  5. Hypertension: Blood pressure stable  6. Hypothyroidism: Managed by primary care  provider    Medication Adjustments/Labs and Tests Ordered: Current medicines are reviewed at length with the patient today.  Concerns regarding medicines are outlined above.  Medication changes, Labs and Tests ordered today are listed in the Patient Instructions below. Patient Instructions  Medication Instructions:  Your physician recommends that you  continue on your current medications as directed. Please refer to the Current Medication list given to you today.  If you need a refill on your cardiac medications before your next appointment, please call your pharmacy.   Testing/Procedures: Your physician has requested that you have an echocardiogram. Echocardiography is a painless test that uses sound waves to create images of your heart. It provides your doctor with information about the size and shape of your heart and how well your heart's chambers and valves are working. This procedure takes approximately one hour. There are no restrictions for this procedure.  Follow-Up: At [facility], you and your health needs are our priority.  As part of our continuing mission to provide you with exceptional heart care, we have created designated Provider Care Teams.  These Care Teams include your primary Cardiologist (physician) and Advanced Practice Providers (APPs -  Physician Assistants and Nurse Practitioners) who all work together to provide you with the care you need, when you need it. . You will need a follow up appointment in 4 weeks with Dr. Ellyn Hack or Almyra Deforest, [place].  Any Other Special Instructions Will Be Listed Below (If Applicable). None       Hilbert Corrigan, [place]  06/07/2018 1:31 AM    [facility] [place], [place], [place]  [address] Phone: [phone]; Fax: [phone]

## 2018-06-04 NOTE — Patient Instructions (Signed)
Medication Instructions:  Your physician recommends that you continue on your current medications as directed. Please refer to the Current Medication list given to you today.  If you need a refill on your cardiac medications before your next appointment, please call your pharmacy.   Testing/Procedures: Your physician has requested that you have an echocardiogram. Echocardiography is a painless test that uses sound waves to create images of your heart. It provides your doctor with information about the size and shape of your heart and how well your heart's chambers and valves are working. This procedure takes approximately one hour. There are no restrictions for this procedure.  Follow-Up: At [facility], you and your health needs are our priority.  As part of our continuing mission to provide you with exceptional heart care, we have created designated Provider Care Teams.  These Care Teams include your primary Cardiologist (physician) and Advanced Practice Providers (APPs -  Physician Assistants and Nurse Practitioners) who all work together to provide you with the care you need, when you need it. . You will need a follow up appointment in 4 weeks with Dr. Ellyn Hack or Almyra Deforest, [place].  Any Other Special Instructions Will Be Listed Below (If Applicable). None

## 2018-06-05 HISTORY — PX: TRANSTHORACIC ECHOCARDIOGRAM: SHX275

## 2018-06-07 ENCOUNTER — Encounter: Payer: Self-pay | Admitting: Physician Assistant

## 2018-06-14 ENCOUNTER — Ambulatory Visit (HOSPITAL_COMMUNITY): Payer: Medicare Other | Attending: Cardiology

## 2018-06-14 DIAGNOSIS — R011 Cardiac murmur, unspecified: Secondary | ICD-10-CM | POA: Insufficient documentation

## 2018-06-25 ENCOUNTER — Other Ambulatory Visit: Payer: Self-pay

## 2018-06-25 DIAGNOSIS — I422 Other hypertrophic cardiomyopathy: Secondary | ICD-10-CM

## 2018-06-25 NOTE — Progress Notes (Signed)
Discussed with Dr. Ellyn Hack, will need limited echo with valsalva to rule out hypertrophic cardiomyopathy

## 2018-06-25 NOTE — Progress Notes (Signed)
I have informed the patient, please cancel her office visit with Dr. Ellyn Hack next week and push back the appointment. Please arrange the echo after 3/3 and Dr. Allison Quarry visit. MD only visit

## 2018-06-28 ENCOUNTER — Other Ambulatory Visit: Payer: Self-pay | Admitting: Cardiology

## 2018-06-29 ENCOUNTER — Ambulatory Visit: Payer: Medicare Other | Admitting: Cardiology

## 2018-07-12 ENCOUNTER — Other Ambulatory Visit (HOSPITAL_COMMUNITY): Payer: Medicare Other

## 2018-07-22 ENCOUNTER — Other Ambulatory Visit (HOSPITAL_COMMUNITY): Payer: Medicare Other

## 2018-08-13 ENCOUNTER — Telehealth: Payer: Self-pay | Admitting: *Deleted

## 2018-08-13 NOTE — Telephone Encounter (Signed)
   Primary Cardiologist:  DR Glenetta Hew   Patient contacted.  History reviewed.  No symptoms to suggest any unstable cardiac conditions.  Based on discussion, with current pandemic situation, we will be postponing this appointment for Brooke Proctor with a plan for f/u SEPT 11 ,2020,AT 8 AM or sooner if feasible/necessary.  If symptoms change, she has been instructed to contact our office.     Raiford Simmonds, RN  08/13/2018 9:56 AM         .

## 2018-08-17 ENCOUNTER — Ambulatory Visit: Payer: Medicare Other | Admitting: Cardiology

## 2018-08-28 ENCOUNTER — Other Ambulatory Visit: Payer: Self-pay | Admitting: Cardiology

## 2018-08-30 ENCOUNTER — Other Ambulatory Visit: Payer: Self-pay | Admitting: Cardiology

## 2018-08-30 NOTE — Telephone Encounter (Signed)
Benazepril 20 mg refilled.

## 2018-08-30 NOTE — Telephone Encounter (Signed)
Brilinta refilled. 

## 2018-09-03 ENCOUNTER — Other Ambulatory Visit: Payer: Self-pay | Admitting: Cardiology

## 2018-09-03 NOTE — Telephone Encounter (Signed)
k-dur refilled. 

## 2018-09-10 ENCOUNTER — Encounter: Payer: Self-pay | Admitting: Podiatry

## 2018-09-10 ENCOUNTER — Ambulatory Visit (INDEPENDENT_AMBULATORY_CARE_PROVIDER_SITE_OTHER): Payer: Medicare Other | Admitting: Podiatry

## 2018-09-10 ENCOUNTER — Other Ambulatory Visit: Payer: Self-pay

## 2018-09-10 VITALS — Temp 97.9°F

## 2018-09-10 DIAGNOSIS — C50919 Malignant neoplasm of unspecified site of unspecified female breast: Secondary | ICD-10-CM | POA: Insufficient documentation

## 2018-09-10 DIAGNOSIS — L600 Ingrowing nail: Secondary | ICD-10-CM | POA: Diagnosis not present

## 2018-09-10 NOTE — Patient Instructions (Signed)

## 2018-09-12 NOTE — Progress Notes (Signed)
Subjective: 68 year old female presents the office today for concerns of a recurrent ingrown toenail to the right big toe, medial aspect.  This is been ongoing for some time but is been keeping her awake at night for the last few nights.  Pressure..  Denies any drainage or pus.  No recent.  She previously did have a partial nail avulsion of this area a few times years ago.  Denies any systemic complaints such as fevers, chills, nausea, vomiting. No acute changes since last appointment, and no other complaints at this time.   Objective: AAO x3, NAD DP/PT pulses palpable bilaterally, CRT less than 3 seconds Along the medial aspect the right hallux toenail there is mild incurvation and tenderness of the distal and middle portion of the nail.  Does not appear the nail that was removed previously is gone back however a new portion of nail started become ingrown.  There is no edema, erythema, drainage or pus there is no signs of infection.  She has had the same nail polish on since November.  No open lesions or pre-ulcerative lesions.  No pain with calf compression, swelling, warmth, erythema  Assessment: Ingrown toenail right medial hallux  Plan: -All treatment options discussed with the patient including all alternatives, risks, complications.  -At this time, the patient is requesting partial nail removal with chemical matricectomy to the symptomatic portion of the nail. Risks and complications were discussed with the patient for which they understand and written consent was obtained. Under sterile conditions a total of 3 mL of a mixture of 2% lidocaine plain and 0.5% Marcaine plain was infiltrated in a hallux block fashion. Once anesthetized, the skin was prepped in sterile fashion. A tourniquet was then applied. Next the medial aspect of hallux nail border was then sharply excised making sure to remove the entire offending nail border. Once the nails were ensured to be removed area was debrided and the  underlying skin was intact. There is no purulence identified in the procedure. Next phenol was then applied under standard conditions and copiously irrigated. Silvadene was applied. A dry sterile dressing was applied. After application of the dressing the tourniquet was removed and there is found to be an immediate capillary refill time to the digit. The patient tolerated the procedure well any complications. Post procedure instructions were discussed the patient for which he verbally understood. Follow-up in one week for nail check or sooner if any problems are to arise. Discussed signs/symptoms of infection and directed to call the office immediately should any occur or go directly to the emergency room. In the meantime, encouraged to call the office with any questions, concerns, changes symptoms. -As a courtesy debrided the other nails at her request without any complications or bleeding.  -Patient encouraged to call the office with any questions, concerns, change in symptoms.   Trula Slade DPM

## 2018-09-24 ENCOUNTER — Other Ambulatory Visit: Payer: Self-pay

## 2018-09-24 ENCOUNTER — Ambulatory Visit: Payer: Medicare Other | Admitting: Podiatry

## 2018-09-24 ENCOUNTER — Encounter: Payer: Self-pay | Admitting: Podiatry

## 2018-09-24 ENCOUNTER — Encounter: Payer: Self-pay | Admitting: Physician Assistant

## 2018-09-24 VITALS — Temp 97.3°F

## 2018-09-24 DIAGNOSIS — R269 Unspecified abnormalities of gait and mobility: Secondary | ICD-10-CM | POA: Diagnosis not present

## 2018-09-24 DIAGNOSIS — L600 Ingrowing nail: Secondary | ICD-10-CM | POA: Diagnosis not present

## 2018-09-24 NOTE — Patient Instructions (Addendum)

## 2018-09-24 NOTE — Progress Notes (Signed)
Subjective: Brooke Proctor is a 68 y.o.  Female returns to office today for follow up evaluation after having right medial Hallux partial nail avulsion performed. Patient has been soaking using epsom salts and applying topical antibiotic covered with bandaid daily.  She does state that last week her grandson stepped on it and she put ointment and peroxide on it.  She also thinks from the way she is walking screen pressure on the big toe.  She was looking her shoes and she is worn out but she was going outside.  Denies any significant drainage.  patient denies fevers, chills, nausea, vomiting. Denies any calf pain, chest pain, SOB.   Objective:  Vitals: Reviewed  General: Well developed, nourished, in no acute distress, alert and oriented x3   Dermatology: Skin is warm, dry and supple bilateral. Right medial hallux nail border appears to be clean, dry, with mild granular tissue and surrounding scab. There is no surrounding erythema, edema, drainage/purulence. The remaining nails appear unremarkable at this time. There are no other lesions or other signs of infection present.  Neurovascular status: Intact. No lower extremity swelling; No pain with calf compression bilateral.  Musculoskeletal: No tenderness to palpation of the medial hallux nail fold. Muscular strength within normal limits bilateral.  On evaluation of her shoes there does appear to be pronation in the shoes and she is wearing the lateral aspect of the shoes as well on the midfoot area.  Assesement and Plan: S/p partial nail avulsion, doing well.   -Continue soaking in epsom salts twice a day followed by antibiotic ointment and a band-aid. Can leave uncovered at night. Continue this until completely healed.  -If the area has not healed in 2 weeks, call the office for follow-up appointment, or sooner if any problems arise.  -We discussed shoe modifications and also shoes with again over-the-counter inserts inside the shoes for light  arch support. -Monitor for any signs/symptoms of infection. Call the office immediately if any occur or go directly to the emergency room. Call with any questions/concerns.  Celesta Gentile, DPM

## 2018-10-07 ENCOUNTER — Other Ambulatory Visit: Payer: Self-pay | Admitting: Cardiology

## 2018-10-20 ENCOUNTER — Telehealth (HOSPITAL_COMMUNITY): Payer: Self-pay | Admitting: Radiology

## 2018-10-20 NOTE — Telephone Encounter (Signed)
Left message to call office-Patient needs to schedule an echocardiogram.  

## 2018-12-09 ENCOUNTER — Other Ambulatory Visit (HOSPITAL_COMMUNITY): Payer: Medicare Other

## 2018-12-17 ENCOUNTER — Telehealth: Payer: Self-pay | Admitting: Adult Health

## 2018-12-17 NOTE — Telephone Encounter (Signed)
Returned call re rescheduling 8/17 appointment. Not able to reach patient. Left message with new appointment for 9/2. Schedule mailed.

## 2018-12-20 ENCOUNTER — Encounter: Payer: Medicare Other | Admitting: Adult Health

## 2018-12-29 ENCOUNTER — Other Ambulatory Visit: Payer: Self-pay | Admitting: Cardiology

## 2019-01-03 ENCOUNTER — Telehealth: Payer: Self-pay | Admitting: Oncology

## 2019-01-03 NOTE — Telephone Encounter (Signed)
Returned call re rescheduling 9/2 f/u. Confirmed 10/14 LTS visit with patient. Patient rescheduling due to eye surgery.

## 2019-01-04 HISTORY — PX: TRANSTHORACIC ECHOCARDIOGRAM: SHX275

## 2019-01-05 ENCOUNTER — Encounter: Payer: Medicare Other | Admitting: Adult Health

## 2019-01-14 ENCOUNTER — Ambulatory Visit: Payer: Medicare Other | Admitting: Cardiology

## 2019-01-25 ENCOUNTER — Other Ambulatory Visit: Payer: Self-pay

## 2019-01-25 ENCOUNTER — Ambulatory Visit (HOSPITAL_COMMUNITY): Payer: Medicare Other | Attending: Cardiology

## 2019-01-25 DIAGNOSIS — I422 Other hypertrophic cardiomyopathy: Secondary | ICD-10-CM | POA: Diagnosis present

## 2019-01-28 NOTE — Progress Notes (Signed)
The patient has been notified of the result and verbalized understanding.  All questions (if any) were answered. Jacqulynn Cadet, [place] 01/28/2019 11:47 AM

## 2019-02-16 ENCOUNTER — Encounter: Payer: Self-pay | Admitting: Adult Health

## 2019-02-16 ENCOUNTER — Inpatient Hospital Stay: Payer: Medicare Other | Attending: Adult Health | Admitting: Adult Health

## 2019-02-16 ENCOUNTER — Other Ambulatory Visit: Payer: Self-pay

## 2019-02-16 VITALS — BP 148/86 | HR 67 | Temp 98.2°F | Resp 18 | Ht 66.0 in | Wt 202.1 lb

## 2019-02-16 DIAGNOSIS — C50412 Malignant neoplasm of upper-outer quadrant of left female breast: Secondary | ICD-10-CM | POA: Diagnosis not present

## 2019-02-16 DIAGNOSIS — Z1231 Encounter for screening mammogram for malignant neoplasm of breast: Secondary | ICD-10-CM | POA: Diagnosis not present

## 2019-02-16 DIAGNOSIS — Z853 Personal history of malignant neoplasm of breast: Secondary | ICD-10-CM | POA: Diagnosis not present

## 2019-02-16 NOTE — Progress Notes (Signed)
CLINIC:  Survivorship   REASON FOR VISIT:  Routine follow-up for history of breast cancer.   BRIEF ONCOLOGIC HISTORY:   (1) status post left lumpectomy and axillary lymph node dissection 12/03/2007 for a pT1c pNi, stage IIA invasive ductal carcinoma, grade 3, triple negative, with an MIB-1 of 58%.  (2) status post cyclophosphamide and docetaxel x6  (3) status post adjuvant radiation completed February of 2010.  INTERVAL HISTORY:  Ms. Silverthorn presents to the Survivorship [facility] today for routine follow-up for her history of breast cancer.  Overall, she reports feeling quite well.   She says she last had a mammogram 2 years ago.  She had the last one in [facility].  She cannot recall the name.  She notes that she has right breast pain, focal, underneath the nipple.  She has no breast changes.  She admits she drinks increased amounts of caffeine and needs to increase her water intake.  She is not exercising.  She says she sees her PCP regularly, and is up to date on colon cancer and skin cancer screening.  She says she no longer requires gyn cancer screening.     REVIEW OF SYSTEMS:  Review of Systems  Constitutional: Negative for appetite change, chills, fatigue, fever and unexpected weight change.  HENT:   Negative for hearing loss, lump/mass, sore throat and trouble swallowing.   Eyes: Negative for eye problems and icterus.  Respiratory: Negative for chest tightness, cough and shortness of breath.   Cardiovascular: Negative for chest pain, leg swelling and palpitations.  Gastrointestinal: Negative for abdominal distention, abdominal pain, constipation, diarrhea, nausea and vomiting.  Endocrine: Negative for hot flashes.  Genitourinary: Negative for difficulty urinating.   Musculoskeletal: Negative for arthralgias.  Skin: Negative for itching and rash.  Neurological: Negative for dizziness and numbness.  Hematological: Negative for adenopathy. Does not bruise/bleed easily.   Psychiatric/Behavioral: Negative for depression. The patient is not nervous/anxious.    Breast: Denies any new nodularity, masses, tenderness, nipple changes, or nipple discharge.     PAST MEDICAL/SURGICAL HISTORY:  Past Medical History:  Diagnosis Date  . Breast cancer ([place])   . CAD S/P percutaneous coronary angioplasty    a. NSTEMI 09/2014: s/p DES to mLAD and mRCA. Normal EF.  . Essential hypertension   . History of radiation therapy   . Hyperlipidemia   . Mild valvular heart disease    a. Echo 09/2014: mild AI/MR.  . NSTEMI (non-ST elevated myocardial infarction) ([place]) 09/19/2014  . Obesity   . Status post chemotherapy   . Thyroid disease    Past Surgical History:  Procedure Laterality Date  . CARDIAC CATHETERIZATION N/A 09/20/2014   Procedure: Left Heart Cath and Coronary Angiography;  Surgeon: Peter M Martinique, MD;  Location: [place] [facility];  Service: Cardiovascular; 80% mLAD, 90% mRCA  . CARDIAC CATHETERIZATION  09/20/2014   Procedure: Coronary Stent Intervention;  Surgeon: Peter M Martinique, MD;  Location: [place] [facility];  Service: Cardiovascular;;mLAD Promus Premeir DES 2.5 mm x 16 mm , mRCA - Promus Premier DES 4.0 mm x 16 mm (4.5 mm)   . CARDIAC EVENT MONITOR  10/2015   SR & S Tachycardia. Occasional PVCs.  No arrhythmias  . LYMPH NODE DISSECTION    . NM MYOVIEW LTD  01/2017   Normal/low risk study.  No ischemia or infarction.  EF 67%.  . TRANSTHORACIC ECHOCARDIOGRAM  09/21/2014   Normal LV Size, mild LVH.  EF 60-65%, Gr 1 DD, mild AI & MR  ALLERGIES:  Allergies  Allergen Reactions  . Eggs Or Egg-Derived Products Swelling    ALLERGY: EGG WHITES  . Nickel Swelling and Other (See Comments)    REACTION:"BLISTERS"  . Adhesive [Tape] Itching  . Latex Itching     CURRENT MEDICATIONS:  Outpatient Encounter Medications as of 02/16/2019  Medication Sig  . acetaminophen (TYLENOL) 325 MG tablet Take 2 tablets (650 mg total) by mouth every 4 (four) hours as needed  for headache or mild pain.  Marland Kitchen amoxicillin-clavulanate (AUGMENTIN) 875-125 MG tablet TK 1 T PO BID FOR 10 DAYS  . atorvastatin (LIPITOR) 40 MG tablet TAKE 1 TABLET(40 MG) BY MOUTH AT BEDTIME  . benazepril (LOTENSIN) 20 MG tablet TAKE 1 TABLET(20 MG) BY MOUTH DAILY  . Biotin 1000 MCG tablet Take 1,000 mcg by mouth daily.  Marland Kitchen BRILINTA 60 MG TABS tablet TAKE 1 TABLET(60 MG) BY MOUTH TWICE DAILY  . celecoxib (CELEBREX) 200 MG capsule Take 1 capsule by mouth as directed.  . ergocalciferol (VITAMIN D2) 50000 UNITS capsule Take 50,000 Units by mouth once a week. Take on sundays  . hydrochlorothiazide (HYDRODIURIL) 12.5 MG tablet Take 1 tablet (12.5 mg total) by mouth daily.  Marland Kitchen levothyroxine (SYNTHROID, LEVOTHROID) 100 MCG tablet Take 100 mcg by mouth daily before breakfast.  . metoprolol succinate (TOPROL-XL) 25 MG 24 hr tablet TAKE 1 TABLET(25 MG) BY MOUTH DAILY  . nitroGLYCERIN (NITROSTAT) 0.4 MG SL tablet PLACE 1 TABLET UNDER THE TONGUE EVERY 5 MINUTES FOR 3 DOSES AS NEEDED FOR CHEST PAIN  . potassium chloride SA (K-DUR) 20 MEQ tablet TAKE 1 TABLET BY MOUTH DAILY  . promethazine (PHENERGAN) 25 MG tablet Take 25 mg by mouth every 8 (eight) hours as needed for nausea or vomiting.  . trimethoprim-polymyxin b (POLYTRIM) ophthalmic solution INT 1 GTT IN OD TID FOR 10 DAYS   No facility-administered encounter medications on file as of 02/16/2019.      ONCOLOGIC FAMILY HISTORY:  Family History  Problem Relation Age of Onset  . Hypertension Mother   . Stroke Mother   . Hypertension Father   . Kidney disease Father   . CAD Neg Hx     GENETIC COUNSELING/TESTING: Discussed with patient and declined  SOCIAL HISTORY:  Social History   Socioeconomic History  . Marital status: Divorced    Spouse name: Not on file  . Number of children: Not on file  . Years of education: Not on file  . Highest education level: Not on file  Occupational History  . Not on file  Social Needs  . Financial resource  strain: Not on file  . Food insecurity    Worry: Not on file    Inability: Not on file  . Transportation needs    Medical: Not on file    Non-medical: Not on file  Tobacco Use  . Smoking status: Never Smoker  . Smokeless tobacco: Never Used  Substance and Sexual Activity  . Alcohol use: No    Alcohol/week: 0.0 standard drinks  . Drug use: No  . Sexual activity: Not on file  Lifestyle  . Physical activity    Days per week: Not on file    Minutes per session: Not on file  . Stress: Not on file  Relationships  . Social Herbalist on phone: Not on file    Gets together: Not on file    Attends religious service: Not on file    Active member of club or organization: Not on file  Attends meetings of clubs or organizations: Not on file    Relationship status: Not on file  . Intimate partner violence    Fear of current or ex partner: Not on file    Emotionally abused: Not on file    Physically abused: Not on file    Forced sexual activity: Not on file  Other Topics Concern  . Not on file  Social History Narrative  . Not on file      PHYSICAL EXAMINATION:  Vital Signs: Vitals:   02/16/19 1129  BP: (!) 148/86  Pulse: 67  Resp: 18  Temp: 98.2 F (36.8 C)  SpO2: 98%   Filed Weights   02/16/19 1129  Weight: 202 lb 1.6 oz (91.7 kg)   General: Well-nourished, well-appearing female in no acute distress.  Unaccompanied today.   HEENT: Head is normocephalic.  Pupils equal and reactive to light. Conjunctivae clear without exudate.  Sclerae anicteric. Oral mucosa is pink, moist.  Oropharynx is pink without lesions or erythema.  Lymph: No cervical, supraclavicular, or infraclavicular lymphadenopathy noted on palpation.  Cardiovascular: Regular rate and rhythm.Marland Kitchen Respiratory: Clear to auscultation bilaterally. Chest expansion symmetric; breathing non-labored.  Breast Exam:  -Left breast: No appreciable masses on palpation. No skin redness, thickening, or peau  d'orange appearance; no nipple retraction or nipple discharge; mild distortion in symmetry at previous lumpectomy site well healed scar without erythema or nodularity.  -Right breast: No appreciable masses on palpation. No skin redness, thickening, or peau d'orange appearance; no nipple retraction or nipple discharge;  -Axilla: No axillary adenopathy bilaterally.  GI: Abdomen soft and round; non-tender, non-distended. Bowel sounds normoactive. No hepatosplenomegaly.   GU: Deferred.  Neuro: No focal deficits. Steady gait.  Psych: Mood and affect normal and appropriate for situation.  MSK: No focal spinal tenderness to palpation, full range of motion in bilateral upper extremities Extremities: No edema. Skin: Warm and dry.  LABORATORY DATA:  None for this visit   DIAGNOSTIC IMAGING:  Most recent mammogram:  overdue    ASSESSMENT AND PLAN:  Ms.. Hoffart is a pleasant 68 y.o. female with history of Stage IIA left breast invasive ductal carcinoma, ER-/PR-/HER2-, diagnosed in 2009, treated with lumpectomy, adjuvant chemotherapy, and adjuvant radiation therapy.  She presents to the [facility]orship [facility] for surveillance and routine follow-up.   1. History of breast cancer:  Ms. Melman is currently clinically and radiographically without evidence of disease or recurrence of breast cancer. She is overdue for a mammogram.  She would like to have these done at the breast center.  She would like for it to include an ultrasound due to her breast pain.  She will return in 1 year for LTS f/u.  I encouraged her to call me with any questions or concerns before her next visit at the cancer center, and I would be happy to see her sooner, if needed.    2. Right breast pain: No abnormality on exam, recommended she decrease her caffeine intake.  Patient will do this, however she would like diagnostic mammogram and ultrasound, due to fear of breast cancer, since this is similar to how her breast cancer initially  presented in 2009 when she was diagnosed.    3. Genetics: She is eligible for testing, and I let her know the indications for testing, and how it may help her for future screening.  She understands and declines testing at this point.  She says her daughters were tested and that is good enough for her.  4. Bone health:  Given Ms. Hopwood's age, history of breast cancer she is at risk for bone demineralization.  She was given education on specific food and activities to promote bone health.  5. Cancer screening:  Due to Ms. Knighton's history and her age, she should receive screening for skin cancers, colon cancer. She was encouraged to follow-up with her PCP for appropriate cancer screenings.   6. Health maintenance and wellness promotion: Ms. Limburg was encouraged to consume 5-7 servings of fruits and vegetables per day. She was also encouraged to engage in moderate to vigorous exercise for 30 minutes per day most days of the week. She was instructed to limit her alcohol consumption and continue to abstain from tobacco use.    Dispo:  -Return to cancer center in one year for LTS follow up -Mammogram due    A total of (30) minutes of face-to-face time was spent with this patient with greater than 50% of that time in counseling and care-coordination.   Gardenia Phlegm, [place] [phone]   Note: PRIMARY CARE PROVIDER Nicola Girt, [place] [phone]

## 2019-02-17 ENCOUNTER — Telehealth: Payer: Self-pay | Admitting: Adult Health

## 2019-02-17 NOTE — Telephone Encounter (Signed)
No los per 10/14. °

## 2019-03-07 ENCOUNTER — Ambulatory Visit: Payer: Medicare Other | Admitting: Cardiology

## 2019-03-18 ENCOUNTER — Other Ambulatory Visit: Payer: Self-pay

## 2019-03-18 ENCOUNTER — Ambulatory Visit: Payer: Medicare Other | Admitting: Cardiology

## 2019-03-18 ENCOUNTER — Other Ambulatory Visit: Payer: Self-pay | Admitting: Cardiology

## 2019-03-18 VITALS — BP 132/91 | HR 67 | Ht 66.0 in | Wt 207.0 lb

## 2019-03-18 DIAGNOSIS — I2510 Atherosclerotic heart disease of native coronary artery without angina pectoris: Secondary | ICD-10-CM

## 2019-03-18 DIAGNOSIS — I38 Endocarditis, valve unspecified: Secondary | ICD-10-CM | POA: Diagnosis not present

## 2019-03-18 DIAGNOSIS — E785 Hyperlipidemia, unspecified: Secondary | ICD-10-CM | POA: Diagnosis not present

## 2019-03-18 DIAGNOSIS — I10 Essential (primary) hypertension: Secondary | ICD-10-CM

## 2019-03-18 DIAGNOSIS — E669 Obesity, unspecified: Secondary | ICD-10-CM

## 2019-03-18 DIAGNOSIS — I252 Old myocardial infarction: Secondary | ICD-10-CM

## 2019-03-18 DIAGNOSIS — Z9861 Coronary angioplasty status: Secondary | ICD-10-CM

## 2019-03-18 MED ORDER — NITROGLYCERIN 0.4 MG SL SUBL: SUBLINGUAL_TABLET | SUBLINGUAL | 2 refills | Status: DC

## 2019-03-18 MED ORDER — METOPROLOL SUCCINATE ER 50 MG PO TB24: 50.0000 mg | ORAL_TABLET | Freq: Every day | ORAL | 3 refills | Status: DC

## 2019-03-18 MED ORDER — TICAGRELOR 60 MG PO TABS: ORAL_TABLET | ORAL | 3 refills | Status: DC

## 2019-03-18 MED ORDER — HYDROCHLOROTHIAZIDE 12.5 MG PO TABS: 12.5000 mg | ORAL_TABLET | ORAL | 3 refills | Status: DC | PRN

## 2019-03-18 MED ORDER — POTASSIUM CHLORIDE CRYS ER 20 MEQ PO TBCR: 20.0000 meq | EXTENDED_RELEASE_TABLET | Freq: Every day | ORAL | 3 refills | Status: DC

## 2019-03-18 MED ORDER — ATORVASTATIN CALCIUM 40 MG PO TABS: ORAL_TABLET | ORAL | 3 refills | Status: DC

## 2019-03-18 NOTE — Patient Instructions (Addendum)
Medication Instructions:   STOP HCTZ DAILY  AND CAN TAKE AS NEEDED   INCREASE METOPROLOL SUCCINATE  50 MG  DAILY    *If you need a refill on your cardiac medications before your next appointment, please call your pharmacy*  Lab Work: NOT NEEDED  Testing/Procedures: Not needed  Follow-Up: At [facility], you and your health needs are our priority.  As part of our continuing mission to provide you with exceptional heart care, we have created designated Provider Care Teams.  These Care Teams include your primary Cardiologist (physician) and Advanced Practice Providers (APPs -  Physician Assistants and Nurse Practitioners) who all work together to provide you with the care you need, when you need it.  Your next appointment:   6 months-May 2021  The format for your next appointment:   In Person  Provider:   Glenetta Hew, MD  Other Instructions    HYDRATE , HYDRATE -- DRINK AT LEAST  10 GLASSES  A DAY -

## 2019-03-18 NOTE — Progress Notes (Signed)
Primary Care Provider: Nicola Girt, DO Cardiologist: Glenetta Hew, MD Electrophysiologist:   Clinic Note: Chief Complaint  Patient presents with  . Follow-up    Echo results  . Cardiac Valve Problem    Systolic anterior motion of it.  Leaflet of the mitral valve noted on echo  . Coronary Artery Disease    No angina    HPI:    Brooke Proctor is a 68 y.o. female with a PMH HTN, HLD & CAD (NSTEMI 2016) who presents today for ~annual f/u and to discuss results of her echocardiogram.   Cardiac Cath-PCI 09/20/2014: 20% p-mLAD, 50% OM1, 50% OM 2, 90% mid LAD lesion treated with DES, 80% mid RCA lesion treated with DES complicated by transient no flow phenomenon.  Myoview 01/2017: no Ischemia or Infarct.   Brooke Proctor was last seen on 06/04/2018 by Almyra Deforest, PA-C - heard load MR murmur -- Echo ordered; suggestion of SAM & ?HOCM -- f/u Echo with Valsalva just completed.    Recent Hospitalizations: none  Reviewed  CV studies:    The following studies were reviewed today: (if available, images/films reviewed: From Epic Chart or Care Everywhere) . TTE 06/2018: Normal EF 60 to 65%.  LVH (septal).  Mild anterior MI leaflet S.A.M. LVOT obstruction at rest (?  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 . TTE 01/2019: EF 70-75%.  Hyperkinetic LV.  Severely increased septal wall.  GR 1 DD.  Normal biatrial size.  No valve stenosis.  Ascending aorta 43 mm.**   Interval History:   Brooke Proctor returns here today for follow-up indicating that much she is doing okay from a cardiac standpoint.  She still has the 2 to 3 seconds of shortness of breath and fluttering sensation when she lies down at night.  Its only when she first lies down, but then no other problems.  No PND orthopnea.  No edema. Otherwise she is somewhat active, does not exercise as much she should, but is still doing some exercise and walking and has not noticed  any exertional dyspnea.  She does her stationary bicycle when she is able to go to the gym and has no problems.  No real symptomatic differences than her last visit besides the fact that these little fluttering spells are less pronounced.  CV Review of Symptoms (Summary)  no chest pain or dyspnea on exertion positive for - Fluttering initial oral dyspnea with lying down symptoms noted above. negative for - edema, irregular heartbeat, orthopnea, palpitations, paroxysmal nocturnal dyspnea, rapid heart rate, shortness of breath or Syncope/near syncope, TIA/amaurosis fugax.  Claudication.  The patient does not have symptoms concerning for COVID-19 infection (fever, chills, cough, or new shortness of breath).  The patient is practicing social distancing. ++ Masking.  ++ Groceries/shopping.  --> Tested for SARS-COV-2 01/09/2019: Not detected  She is accompanied by one of her 2 daughters today.  Her daughter came in from out of town.  She herself has IHSS/HOCM with an ICD in place, but has been doing well otherwise.  The other daughter however has had more issues with her cardiomyopathy.-I apparently the donor parent was actually the father and not Ms. Owens Shark.  REVIEWED OF SYSTEMS   A comprehensive ROS was performed. Review of Systems  Constitutional: Negative for malaise/fatigue and weight loss.  HENT: Negative for congestion and nosebleeds.   Respiratory: Positive for shortness of breath (Only brief episodes when first lying down).  Cardiovascular: Negative for chest pain and leg swelling.  Gastrointestinal: Positive for abdominal pain. Negative for blood in stool.  Genitourinary: Negative for hematuria.  Musculoskeletal: Negative for falls and joint pain.  Neurological: Positive for dizziness (Really those spells that are noted above.  Otherwise no problems). Negative for focal weakness, seizures and weakness.  Psychiatric/Behavioral: Negative for depression and memory loss. The patient is  nervous/anxious. The patient does not have insomnia.   All other systems reviewed and are negative.    I have reviewed and (if needed) personally updated the patient's problem list, medications, allergies, past medical and surgical history, social and family history.   PAST MEDICAL HISTORY   Past Medical History:  Diagnosis Date  . Breast cancer ([place])   . CAD S/P percutaneous coronary angioplasty    a. NSTEMI 09/2014: s/p DES to mLAD and mRCA. Normal EF.  . Essential hypertension   . History of radiation therapy   . Hyperlipidemia   . Mild valvular heart disease    -> Echo February 2020 suggested S.A.M. of anterior MV leaflet with LVOT obstruction at rest suggesting HOCM--repeat study with Valsalva did not suggest this.  Severely increased septal wall thickness was noted.  No evidence of S.A.M.  . NSTEMI (non-ST elevated myocardial infarction) ([place]) 09/19/2014  . Obesity   . Status post chemotherapy   . Thyroid disease      PAST SURGICAL HISTORY   Past Surgical History:  Procedure Laterality Date  . CARDIAC CATHETERIZATION N/A 09/20/2014   Procedure: Left Heart Cath and Coronary Angiography;  Surgeon: Peter M Martinique, MD;  Location: [facility];  Service: Cardiovascular; 80% mLAD, 90% mRCA  . CARDIAC CATHETERIZATION  09/20/2014   Procedure: Coronary Stent Intervention;  Surgeon: Peter M Martinique, MD;  Location: [facility];  Service: Cardiovascular;;mLAD Promus Premeir DES 2.5 mm x 16 mm , mRCA - Promus Premier DES 4.0 mm x 16 mm (4.5 mm)   . CARDIAC EVENT MONITOR  10/2015   SR & S Tachycardia. Occasional PVCs.  No arrhythmias  . LYMPH NODE DISSECTION    . NM MYOVIEW LTD  01/2017   Normal/low risk study.  No ischemia or infarction.  EF 67%.  . TRANSTHORACIC ECHOCARDIOGRAM  06/2018    Normal EF 60 to 65%.  LVH (septal).  Mild anterior MI leaflet S.A.M. LVOT obstruction at rest (?  Possible forme fruste of HOCM) -consider repeat study with Doppler and Valsalva LVOT  gradient..  . TRANSTHORACIC ECHOCARDIOGRAM  01/2019   Hyperdynamic LV.  Severely increased septal wall.  GR 1 DD.  Normal biatrial size.  No valvular valvular stenosis.  Ascending aorta dilated to 43 mm.-No evidence of S.A.M.     MEDICATIONS/ALLERGIES   No outpatient medications have been marked as taking for the 03/18/19 encounter (Office Visit) with Leonie Man, MD.    Allergies  Allergen Reactions  . Eggs Or Egg-Derived Products Swelling    ALLERGY: EGG WHITES  . Nickel Swelling and Other (See Comments)    REACTION:"BLISTERS"  . Adhesive [Tape] Itching  . Latex Itching     SOCIAL HISTORY/FAMILY HISTORY   Social History   Tobacco Use  . Smoking status: Never Smoker  . Smokeless tobacco: Never Used  Substance Use Topics  . Alcohol use: No    Alcohol/week: 0.0 standard drinks  . Drug use: No   Social History   Social History Narrative  . Not on file    Family History family history includes Hypertension in  her father and mother; Hypertrophic cardiomyopathy in her daughter; Kidney disease in her father; Stroke in her mother.   OBJCTIVE -PE, EKG, labs   Wt Readings from Last 3 Encounters:  03/18/19 207 lb (93.9 kg)  02/16/19 202 lb 1.6 oz (91.7 kg)  06/04/18 210 lb 9.6 oz (95.5 kg)    Physical Exam: BP (!) 132/91   Pulse 67   Ht 5\' 6"  (1.676 m)   Wt 207 lb (93.9 kg)   SpO2 98%   BMI 33.41 kg/m  Physical Exam  Constitutional: She is oriented to person, place, and time. She appears well-developed and well-nourished. No distress.  HENT:  Head: Normocephalic and atraumatic.  Neck: Normal range of motion. Neck supple. No JVD present.  Cardiovascular: Normal rate, regular rhythm and intact distal pulses.  No extrasystoles are present. PMI is not displaced. Exam reveals no gallop and no friction rub.  Murmur heard. High-pitched blowing decrescendo holosystolic murmur is present with a grade of 2/6 at the apex. Pulmonary/Chest: Effort normal. No  respiratory distress. She has no wheezes. She has no rales.  Abdominal: Soft. Bowel sounds are normal. She exhibits no distension. There is no abdominal tenderness. There is no rebound.  Minimal truncal obesity.  No HSM  Musculoskeletal: Normal range of motion.        General: No edema.  Neurological: She is alert and oriented to person, place, and time.  Psychiatric: She has a normal mood and affect. Her behavior is normal. Judgment and thought content normal.  Vitals reviewed.    Adult ECG Report  Rate: 67;  Rhythm: normal sinus rhythm and LAFB (-57), RBBB, LVH and LAA.;   Narrative Interpretation:No notable difference.  Recent Labs: Apparently just had labs checked by PCP.  Not available. Lab Results  Component Value Date   CHOL 102 (L) 10/23/2015   HDL 40 (L) 10/23/2015   LDLCALC 50 10/23/2015   TRIG 61 10/23/2015   CHOLHDL 2.6 10/23/2015   Lab Results  Component Value Date   CREATININE 0.94 12/18/2017   BUN 16 12/18/2017   NA 143 12/18/2017   K 3.7 12/18/2017   CL 107 12/18/2017   CO2 28 12/18/2017    ASSESSMENT/PLAN   Problem List Items Addressed This Visit    History of non-STEMI (Chronic)    Just about 4 years out from non-STEMI with two-vessel PCI.  No further anginal symptoms.  On stable regimen.  No heart failure symptoms.      CAD S/P percutaneous coronary angioplasty (Chronic)    Two-vessel PCI in 2016.  No further anginal symptoms.  Is active, but would probably want to be more active.  Plan: Continue maintenance dose Brilinta-okay to hold for procedures.   On atorvastatin, and Toprol.       Relevant Medications   nitroGLYCERIN (NITROSTAT) 0.4 MG SL tablet   hydrochlorothiazide (HYDRODIURIL) 12.5 MG tablet   atorvastatin (LIPITOR) 40 MG tablet   metoprolol succinate (TOPROL-XL) 50 MG 24 hr tablet   Other Relevant Orders   EKG 12-Lead   Hyperlipidemia with target LDL less than 70 (Chronic)    Unfortunately, I do not have lab results on her.  We  will need to try to get labs from her PCP.  Labs look great in 2017 metal have labs since then.  She is on atorvastatin 40 mg daily.      Relevant Medications   nitroGLYCERIN (NITROSTAT) 0.4 MG SL tablet   hydrochlorothiazide (HYDRODIURIL) 12.5 MG tablet   atorvastatin (LIPITOR) 40  MG tablet   metoprolol succinate (TOPROL-XL) 50 MG 24 hr tablet   Obesity (BMI 30.0-34.9) (Chronic)    Does support his weight loss--diet exercise.  Daughter was here to her monitor as well.  Now that things are back opening back up again, she needs to get back to doing her routine exercise.      Essential hypertension (Chronic)    Blood pressure borderline today on Toprol and low-dose HCTZ.  Stopping HCTZ to increase Toprol to 50 mg.    Continue benazepril 20 mg.      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  I suspect that she probably has increased septal wall thickness, and there may be some component of hypertrophic cardiomyopathy.  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  Her daughters both have a but that came from their father who had documented HOCM/IHSS.  Regardless, I think the pathophysiology of S.A.M. is exacerbated by dehydration and anxiety with tachycardia.  I do not hear as much of a murmur here today on exam is is was heard back in February.  That probably goes along with the findings on the follow-up echo.  We will certainly pay attention to her murmur and reevaluate echo with Valsalva probably next year just to reassess.  PLAN for now will be to avoid dehydration and increase heart rate control  DC HCTZ  Increase Toprol to 50 mg daily  Drink at least 10 cups of water a day  Monitor for  symptoms and follow-up and consider rechecking echo.      Relevant Medications   nitroGLYCERIN (NITROSTAT) 0.4 MG SL tablet   hydrochlorothiazide (HYDRODIURIL) 12.5 MG tablet   atorvastatin (LIPITOR) 40 MG tablet   metoprolol succinate (TOPROL-XL) 50 MG 24 hr tablet   Other Relevant Orders   EKG 12-Lead      COVID-19 Education: The signs and symptoms of COVID-19 were discussed with the patient and how to seek care for testing (follow up with PCP or arrange E-visit).   The importance of social distancing was discussed today.  I spent a total of 30 minutes with the patient and chart review. >  50% of the time was spent in direct patient consultation.  Additional time spent with chart review (studies, outside notes, etc): 15 Total Time: 45 min Extra time was spent with explanations because the presence of her daughter.  Her having a condition with similar findings on echo made it easier to explain.  Current medicines are reviewed at length with the patient today.  (+/- concerns) n/a   Patient Instructions / Medication Changes & Studies & Tests Ordered   Patient Instructions  Medication Instructions:   STOP HCTZ DAILY  AND CAN TAKE AS NEEDED   INCREASE METOPROLOL SUCCINATE  50 MG  DAILY    *If you need a refill on your cardiac medications before your next appointment, please call your pharmacy*  Lab Work: NOT NEEDED  Testing/Procedures: Not needed  Follow-Up: At [facility], you and your health needs are our priority.  As part of our continuing mission to provide you with exceptional heart care, we have created designated Provider Care Teams.  These Care Teams include your primary Cardiologist (physician) and Advanced Practice Providers (APPs -  Physician Assistants and Nurse Practitioners) who all work together to provide you with the care you need, when you need it.  Your next appointment:   6 months-May 2021  The format for your next appointment:   In Person   Provider:   Glenetta Hew, MD  Other Instructions    HYDRATE , HYDRATE -- DRINK AT LEAST  10 GLASSES  A DAY -   Studies Ordered:   Orders Placed This Encounter  Procedures  . EKG 12-Lead     Glenetta Hew, M.D., M.S. Interventional Cardiologist   Pager # [phone] Phone # [phone] [address] [place], [place] [address]   Thank you for choosing Heartcare at [facility]!!

## 2019-03-20 ENCOUNTER — Encounter: Payer: Self-pay | Admitting: Cardiology

## 2019-03-20 NOTE — Assessment & Plan Note (Signed)
Does support his weight loss--diet exercise.  Daughter was here to her monitor as well.  Now that things are back opening back up again, she needs to get back to doing her routine exercise.

## 2019-03-20 NOTE — Assessment & Plan Note (Signed)
Unfortunately, I do not have lab results on her.  We will need to try to get labs from her PCP.  Labs look great in 2017 metal have labs since then.  She is on atorvastatin 40 mg daily.

## 2019-03-20 NOTE — Assessment & Plan Note (Signed)
Blood pressure borderline today on Toprol and low-dose HCTZ.  Stopping HCTZ to increase Toprol to 50 mg.    Continue benazepril 20 mg.

## 2019-03-20 NOTE — Assessment & Plan Note (Signed)
Interesting findings on echocardiogram.  I suspect that she probably has increased septal wall thickness, and there may be some component of hypertrophic cardiomyopathy.  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  Her daughters both have a but that came from their father who had documented HOCM/IHSS.  Regardless, I think the pathophysiology of S.A.M. is exacerbated by dehydration and anxiety with tachycardia.  I do not hear as much of a murmur here today on exam is is was heard back in February.  That probably goes along with the findings on the follow-up echo.  We will certainly pay attention to her murmur and reevaluate echo with Valsalva probably next year just to reassess.  PLAN for now will be to avoid dehydration and increase heart rate control  DC HCTZ  Increase Toprol to 50 mg daily  Drink at least 10 cups of water a day  Monitor for symptoms and follow-up and consider rechecking echo.

## 2019-03-20 NOTE — Assessment & Plan Note (Signed)
Just about 4 years out from non-STEMI with two-vessel PCI.  No further anginal symptoms.  On stable regimen.  No heart failure symptoms.

## 2019-03-20 NOTE — Assessment & Plan Note (Addendum)
Two-vessel PCI in 2016.  No further anginal symptoms.  Is active, but would probably want to be more active.  Plan: Continue maintenance dose Brilinta-okay to hold for procedures.   On atorvastatin, and Toprol.

## 2019-04-06 ENCOUNTER — Other Ambulatory Visit: Payer: Self-pay | Admitting: Cardiology

## 2019-05-16 ENCOUNTER — Telehealth: Payer: Self-pay | Admitting: Cardiology

## 2019-05-16 NOTE — Telephone Encounter (Signed)
New Message:     Pt's daughter have COVID and she have been around her and keep her children. She needs a referral to [facility] so she can get tested for COVID. She have been unable to get in contact with her primary doctor and would like to know if Dr Ellyn Hack would do the referral for her please.

## 2019-05-16 NOTE — Telephone Encounter (Signed)
Contacted pt to f/u. She states she has found location for COVID-19 test. No further questions

## 2019-06-10 MED ORDER — METOPROLOL SUCCINATE ER 50 MG PO TB24: 50.0000 mg | ORAL_TABLET | Freq: Every day | ORAL | 3 refills | Status: DC

## 2019-07-19 ENCOUNTER — Other Ambulatory Visit: Payer: Self-pay | Admitting: Physician Assistant

## 2019-07-20 ENCOUNTER — Other Ambulatory Visit: Payer: Self-pay | Admitting: Cardiology

## 2019-08-03 ENCOUNTER — Other Ambulatory Visit: Payer: Self-pay | Admitting: Cardiology

## 2019-08-16 ENCOUNTER — Ambulatory Visit: Payer: Medicare Other

## 2019-08-27 ENCOUNTER — Other Ambulatory Visit: Payer: Self-pay

## 2019-08-29 ENCOUNTER — Other Ambulatory Visit: Payer: Self-pay | Admitting: Cardiology

## 2019-08-29 MED ORDER — BENAZEPRIL HCL 20 MG PO TABS: 20.0000 mg | ORAL_TABLET | Freq: Every day | ORAL | 0 refills | Status: DC

## 2019-09-22 ENCOUNTER — Encounter: Payer: Self-pay | Admitting: Cardiology

## 2019-09-22 ENCOUNTER — Other Ambulatory Visit: Payer: Self-pay

## 2019-09-22 ENCOUNTER — Ambulatory Visit: Payer: Medicare Other | Admitting: Cardiology

## 2019-09-22 VITALS — BP 142/94 | HR 74 | Ht 66.0 in | Wt 210.4 lb

## 2019-09-22 DIAGNOSIS — I252 Old myocardial infarction: Secondary | ICD-10-CM | POA: Diagnosis not present

## 2019-09-22 DIAGNOSIS — E785 Hyperlipidemia, unspecified: Secondary | ICD-10-CM | POA: Diagnosis not present

## 2019-09-22 DIAGNOSIS — I77810 Thoracic aortic ectasia: Secondary | ICD-10-CM | POA: Insufficient documentation

## 2019-09-22 DIAGNOSIS — I38 Endocarditis, valve unspecified: Secondary | ICD-10-CM

## 2019-09-22 DIAGNOSIS — Z9861 Coronary angioplasty status: Secondary | ICD-10-CM

## 2019-09-22 DIAGNOSIS — I2510 Atherosclerotic heart disease of native coronary artery without angina pectoris: Secondary | ICD-10-CM

## 2019-09-22 DIAGNOSIS — I712 Thoracic aortic aneurysm, without rupture, unspecified: Secondary | ICD-10-CM

## 2019-09-22 DIAGNOSIS — I422 Other hypertrophic cardiomyopathy: Secondary | ICD-10-CM

## 2019-09-22 DIAGNOSIS — I10 Essential (primary) hypertension: Secondary | ICD-10-CM

## 2019-09-22 DIAGNOSIS — I214 Non-ST elevation (NSTEMI) myocardial infarction: Secondary | ICD-10-CM

## 2019-09-22 DIAGNOSIS — E669 Obesity, unspecified: Secondary | ICD-10-CM

## 2019-09-22 HISTORY — DX: Other hypertrophic cardiomyopathy: I42.2

## 2019-09-22 MED ORDER — POTASSIUM CHLORIDE CRYS ER 20 MEQ PO TBCR: EXTENDED_RELEASE_TABLET | ORAL | 3 refills | Status: DC

## 2019-09-22 MED ORDER — METOPROLOL SUCCINATE ER 25 MG PO TB24: 75.0000 mg | ORAL_TABLET | Freq: Every day | ORAL | 3 refills | Status: DC

## 2019-09-22 MED ORDER — ATORVASTATIN CALCIUM 40 MG PO TABS: ORAL_TABLET | ORAL | 3 refills | Status: DC

## 2019-09-22 MED ORDER — BENAZEPRIL HCL 20 MG PO TABS: 20.0000 mg | ORAL_TABLET | Freq: Every day | ORAL | 3 refills | Status: DC

## 2019-09-22 MED ORDER — TICAGRELOR 60 MG PO TABS: ORAL_TABLET | ORAL | 3 refills | Status: DC

## 2019-09-22 NOTE — Patient Instructions (Addendum)
Medication Instructions:   INCREASE METOPROLOL SUCCINATE ( TOPROL XL ) 75 MG   (25 MG X3 TABLETS TOTAL ) *If you need a refill on your cardiac medications before your next appointment, please call your pharmacy*   Lab Work:  [place]    If you have labs (blood work) drawn today and your tests are completely normal, you will receive your results only by: Marland Kitchen MyChart Message (if you have MyChart) OR . A paper copy in the mail If you have any lab test that is abnormal or we need to change your treatment, we will call you to review the results.   Testing/Procedures:  WILL BE SCHEDULE IN SEPT 2021 Non-Cardiac CT scanning, (CAT scanning), is a noninvasive, special x-ray that produces cross-sectional images of the body using x-rays and a computer. CT scans help physicians diagnose and treat medical conditions. For some CT exams, a contrast material is used to enhance visibility in the area of the body being studied. CT scans provide greater clarity and reveal more details than regular x-ray exams.    Follow-Up: At [facility], you and your health needs are our priority.  As part of our continuing mission to provide you with exceptional heart care, we have created designated Provider Care Teams.  These Care Teams include your primary Cardiologist (physician) and Advanced Practice Providers (APPs -  Physician Assistants and Nurse Practitioners) who all work together to provide you with the care you need, when you need it.    Your next appointment:   7 month(s) DEC 2021  The format for your next appointment:   In Person  Provider:   Glenetta Hew, MD   Other Instructions

## 2019-09-22 NOTE — Progress Notes (Signed)
Primary Care Provider: Nicola Girt, DO Cardiologist: Brooke Hew, MD Electrophysiologist: None  Clinic Note: Chief Complaint  Patient presents with  . Follow-up    6 months  . Coronary Artery Disease    No angina  . Cardiomyopathy    Hypertensive/hypertrophic    HPI:    Brooke Proctor is a 69 y.o. female with a PMH notable for CAD (non-STEMI in 2016), hypertension hyperlipidemia below who presents today for 27-month follow-up   Cardiac Cath-PCI 09/20/2014: 20% p-mLAD, 50% OM1, 50% OM 2, 90% mid LAD lesion treated with DES, 80% mid RCA lesion treated with DES complicated by transient no flow phenomenon.  Myoview 01/2017: no Ischemia or Infarct.   TTE 06/2018: Normal EF 60 to 65%.  LVH (septal).  Mild anterior MI leaflet S.A.M. LVOT obstruction at rest (?  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  TTE 01/2019: EF 70-75%.  Hyperkinetic LV.  Severely increased septal wall.  GR 1 DD.  Normal biatrial size.  No valve stenosis.  Ascending aorta 43 mm.**  Brooke Proctor was last seen on March 18, 2019 in follow-up of an echocardiogram indicating hypertrophic cardiomyopathy with S.A.M. of anterior mitral leaflet.   Recent Hospitalizations: None  Reviewed  CV studies:    The following studies were reviewed today: (if available, images/films reviewed: From Epic Chart or Care Everywhere) . None:   Interval History:   Brooke Proctor returns here today for follow-up overall doing pretty well from cardiac standpoint.  She is still active without any significant anginal symptoms with rest or exertion. Not really noticing the flip-flopping spells as much when she lies down at night.  Very brief shortness of breath but not every evening.  Otherwise no PND orthopnea.  Is not as active as she should be, but is trying--really limited by osteoarthritis pains..  Not really having significant dyspnea or dizziness.  No rapid  regular heartbeats palpitations now.  CV Review of Symptoms (Summary) Cardiovascular ROS: no chest pain or dyspnea on exertion positive for - Less frequent spells of supine palpitations and dyspnea. negative for - edema, orthopnea, paroxysmal nocturnal dyspnea, rapid heart rate, shortness of breath or Syncope/near syncope, TIA/amaurosis fugax, claudication.  The patient does not have symptoms concerning for COVID-19 infection (fever, chills, cough, or new shortness of breath).  The patient is practicing social distancing & Masking.   She has had both COVID-19 vaccine injections.  REVIEWED OF SYSTEMS   Review of Systems  Constitutional: Negative for malaise/fatigue and weight loss.  HENT: Negative for congestion and nosebleeds.   Respiratory: Negative for shortness of breath.   Gastrointestinal: Negative for abdominal pain, blood in stool and melena.  Genitourinary: Negative for hematuria.  Musculoskeletal: Positive for joint pain (Arthritis pains in her knees.).  Neurological: Negative for dizziness, focal weakness and headaches.  Psychiatric/Behavioral: Negative for depression. The patient is nervous/anxious.    I have reviewed and (if needed) personally updated the patient's problem list, medications, allergies, past medical and surgical history, social and family history.   PAST MEDICAL HISTORY   Past Medical History:  Diagnosis Date  . Breast cancer ([place])   . CAD S/P percutaneous coronary angioplasty    a. NSTEMI 09/2014: s/p DES to mLAD and mRCA. Normal EF.  . Essential hypertension   . History of radiation therapy   . Hyperlipidemia   . Hypertrophic cardiomyopathy (HCC) -> has relatives with IHSS 09/22/2019   TTE 01/2019: EF 70-75%.  Hyperkinetic LV.  Severely increased septal wall.  GR 1 DD.  Normal biatrial size.  No valve stenosis.  Ascending aorta 43 mm.**  . Mild valvular heart disease    -> Echo February 2020 suggested S.A.M. of anterior MV leaflet with LVOT  obstruction at rest suggesting HOCM--repeat study with Valsalva did not suggest this.  Severely increased septal wall thickness was noted.  No evidence of S.A.M.  . NSTEMI (non-ST elevated myocardial infarction) ([place]) 09/19/2014  . Obesity   . Status post chemotherapy   . Thyroid disease     PAST SURGICAL HISTORY   Past Surgical History:  Procedure Laterality Date  . CARDIAC CATHETERIZATION N/A 09/20/2014   Procedure: Left Heart Cath and Coronary Angiography;  Surgeon: Brooke M Martinique, MD;  Location: [facility];  Service: Cardiovascular; 80% mLAD, 90% mRCA  . CARDIAC CATHETERIZATION  09/20/2014   Procedure: Coronary Stent Intervention;  Surgeon: Brooke M Martinique, MD;  Location: [facility];  Service: Cardiovascular;;mLAD Promus Premeir DES 2.5 mm x 16 mm , mRCA - Promus Premier DES 4.0 mm x 16 mm (4.5 mm)   . CARDIAC EVENT MONITOR  10/2015   SR & S Tachycardia. Occasional PVCs.  No arrhythmias  . LYMPH NODE DISSECTION    . NM MYOVIEW LTD  01/2017   Normal/low risk study.  No ischemia or infarction.  EF 67%.  . TRANSTHORACIC ECHOCARDIOGRAM  06/2018    Normal EF 60 to 65%.  LVH (septal).  Mild anterior MI leaflet S.A.M. LVOT obstruction at rest (?  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  . TRANSTHORACIC ECHOCARDIOGRAM  01/2019   Hyperdynamic LV.  Severely increased septal wall.  GR 1 DD.  Normal biatrial size.  No valvular valvular stenosis.  Ascending aorta dilated to 43 mm.-No evidence of S.A.M.    MEDICATIONS/ALLERGIES   Current Meds  Medication Sig  . acetaminophen (TYLENOL) 325 MG tablet Take 2 tablets (650 mg total) by mouth every 4 (four) hours as needed for headache or mild pain.  Marland Kitchen atorvastatin (LIPITOR) 40 MG tablet TAKE 1 TABLET(40 MG) BY MOUTH AT BEDTIME  . benazepril (LOTENSIN) 20 MG tablet Take 1 tablet (20 mg total) by mouth daily.  . Biotin 1000 MCG tablet Take 1,000 mcg by mouth daily.  . ergocalciferol  (VITAMIN D2) 50000 UNITS capsule Take 50,000 Units by mouth once a week. Take on sundays  . levothyroxine (SYNTHROID, LEVOTHROID) 100 MCG tablet Take 100 mcg by mouth daily before breakfast.  . nitroGLYCERIN (NITROSTAT) 0.4 MG SL tablet PLACE 1 TABLET UNDER THE TONGUE EVERY 5 MINUTES FOR 3 DOSES AS NEEDED FOR CHEST PAIN  . potassium chloride SA (KLOR-CON) 20 MEQ tablet TAKE 1 TABLET(20 MEQ) BY MOUTH DAILY  . promethazine (PHENERGAN) 25 MG tablet Take 25 mg by mouth every 8 (eight) hours as needed for nausea or vomiting.  . ticagrelor (BRILINTA) 60 MG TABS tablet TAKE 1 TABLET(60 MG) BY MOUTH TWICE DAILY  . trimethoprim-polymyxin b (POLYTRIM) ophthalmic solution INT 1 GTT IN OD TID FOR 10 DAYS  . [DISCONTINUED] atorvastatin (LIPITOR) 40 MG tablet TAKE 1 TABLET(40 MG) BY MOUTH AT BEDTIME  . [DISCONTINUED] benazepril (LOTENSIN) 20 MG tablet Take 1 tablet (20 mg total) by mouth daily.  . [DISCONTINUED] metoprolol succinate (TOPROL-XL) 50 MG 24 hr tablet Take 1 tablet (50 mg total) by mouth daily. Take with or immediately following a meal.  . [DISCONTINUED] potassium chloride SA (KLOR-CON) 20 MEQ tablet  TAKE 1 TABLET(20 MEQ) BY MOUTH DAILY  . [DISCONTINUED] ticagrelor (BRILINTA) 60 MG TABS tablet TAKE 1 TABLET(60 MG) BY MOUTH TWICE DAILY    Allergies  Allergen Reactions  . Eggs Or Egg-Derived Products Swelling    ALLERGY: EGG WHITES  . Nickel Swelling and Other (See Comments)    REACTION:"BLISTERS"  . Adhesive [Tape] Itching  . Latex Itching    SOCIAL HISTORY/FAMILY HISTORY   Reviewed in Epic:  Pertinent findings:   She has had both COVID-19 vaccine injections.  OBJCTIVE -PE, EKG, labs   Wt Readings from Last 3 Encounters:  09/22/19 210 lb 6.4 oz (95.4 kg)  03/18/19 207 lb (93.9 kg)  02/16/19 202 lb 1.6 oz (91.7 kg)    Physical Exam: BP (!) 142/94   Pulse 74   Ht 5\' 6"  (1.676 m)   Wt 210 lb 6.4 oz (95.4 kg)   BMI 33.96 kg/m  Physical Exam  Constitutional: She is oriented to  person, place, and time. She appears well-developed and well-nourished. No distress.  Well-groomed.  Healthy-appearing.  HENT:  Head: Normocephalic and atraumatic.  Neck: No hepatojugular reflux and no JVD present. Carotid bruit is not present.  Cardiovascular: Normal rate, regular rhythm, S1 normal and intact distal pulses.  No extrasystoles are present. PMI is not displaced. Exam reveals gallop and S4. Exam reveals no friction rub.  Murmur heard. High-pitched harsh crescendo-decrescendo midsystolic murmur is present with a grade of 2/6 at the upper right sternal border radiating to the neck.  Blowing decrescendo early systolic murmur of grade 1/6 is also present at the lower left sternal border radiating to the apex. Split S2  Pulmonary/Chest: Effort normal and breath sounds normal. No respiratory distress. She has no wheezes. She has no rales.  Abdominal: Soft. Bowel sounds are normal. She exhibits no distension. There is no abdominal tenderness. There is no rebound.  Mild truncal obesity.  No HSM  Musculoskeletal:        General: No edema (Trivial pedal). Normal range of motion.     Cervical back: Normal range of motion and neck supple.  Neurological: She is alert and oriented to person, place, and time.  Psychiatric: Her behavior is normal. Judgment and thought content normal.  Vitals reviewed.   Adult ECG Report  Rate: 74;  Rhythm: normal sinus rhythm and (Bifascicular block: RBBB, LAFB), LVH; otherwise normal intervals and durations.  Narrative Interpretation: Stable EKG  Recent Labs: April 2021: TC 104, TG 44, HDL 36, LDL 55.  A1c 5.6  Na+ 142, K+ 4.6, Cl- 108, HCO3-30, BUN 12, Cr 0.88, Glu 103, Ca2+ 9.6; AST 17, ALT 14, AlkP 79; TSH 2.265  CBC: W 4.2, H/H 12.6/38.5, Plt 214  Lab Results  Component Value Date   CREATININE 0.94 12/18/2017   BUN 16 12/18/2017   NA 143 12/18/2017   K 3.7 12/18/2017   CL 107 12/18/2017   CO2 28 12/18/2017   Lab Results  Component Value  Date   TSH 0.745 09/20/2014    ASSESSMENT/PLAN    Problem List Items Addressed This Visit    History of non-STEMI (Chronic)   Relevant Orders   EKG 12-Lead (Completed)   CAD S/P percutaneous coronary angioplasty - Primary (Chronic)    History of PCI to both the LAD and RCA.  Doing well with no further anginal symptoms.  Continue beta-blocker, ACE inhibitor and statin.  Is on maintenance dose of Brilinta 60 mg twice daily -Okay to hold Brilinta 5-7 days preop for surgeries or  procedures.      Relevant Medications   metoprolol succinate (TOPROL XL) 25 MG 24 hr tablet   atorvastatin (LIPITOR) 40 MG tablet   benazepril (LOTENSIN) 20 MG tablet   Other Relevant Orders   EKG 12-Lead (Completed)   Basic metabolic panel   Hyperlipidemia with target LDL less than 70 (Chronic)    Labs reviewed above, LDL within goal.  Continue current dose of statin.  Tolerating well without any evidence of myalgias.      Relevant Medications   metoprolol succinate (TOPROL XL) 25 MG 24 hr tablet   atorvastatin (LIPITOR) 40 MG tablet   benazepril (LOTENSIN) 20 MG tablet   Obesity (BMI 30.0-34.9) (Chronic)    Discussed the importance of weight loss with diet and exercise.      Essential hypertension (Chronic)    Blood pressure still high today.  With resting heart rate 74, I think we are going to titrate beta-blocker further.  Plan: Increase Toprol to 75 mg daily and continue benazepril.  Avoid diuretic      Relevant Medications   metoprolol succinate (TOPROL XL) 25 MG 24 hr tablet   atorvastatin (LIPITOR) 40 MG tablet   benazepril (LOTENSIN) 20 MG tablet   Mild valvular heart disease (Chronic)    Apparently there is evidence of systolic anterior movement of the anterior leaflet of the mitral valve.  [facility]) this is usually seen with hypertrophic cardiomyopathy, but it could be related to hypertensive.    She does have a family history of IHSS (however this was not commented on during prior  echoes)  Regardless, we will continue to treat accordingly.  Plan: We will titrate metoprolol titrate further.   Avoid dehydration.  (Avoid diuretic) Continue ACE inhibitor for now at current dose.      Relevant Medications   metoprolol succinate (TOPROL XL) 25 MG 24 hr tablet   atorvastatin (LIPITOR) 40 MG tablet   benazepril (LOTENSIN) 20 MG tablet   NSTEMI (non-ST elevated myocardial infarction) (HCC) (Chronic)    Is now 5 years post non-STEMI with two-vessel PCI.  Doing well with no recurrent anginal symptoms.  Preserved EF on echo.      Relevant Medications   metoprolol succinate (TOPROL XL) 25 MG 24 hr tablet   atorvastatin (LIPITOR) 40 MG tablet   benazepril (LOTENSIN) 20 MG tablet   Hypertrophic cardiomyopathy (HCC) (Chronic)    Not sure if this is hypertension related or truthfully HOCM/IHSS given same on echo.  Plan for now will be to avoid dehydration i.e. not using diuretic and continued blood pressure control.  Plan: Increase Toprol to 75 mg with low threshold to increase further to 100 mg at next visit.      Relevant Medications   metoprolol succinate (TOPROL XL) 25 MG 24 hr tablet   atorvastatin (LIPITOR) 40 MG tablet   benazepril (LOTENSIN) 20 MG tablet   Thoracic aortic ectasia (HCC) (Chronic)    Initially seen on echo.  Will have follow-up in September 2021 with CTA chest for more accurate finding.      Relevant Medications   metoprolol succinate (TOPROL XL) 25 MG 24 hr tablet   atorvastatin (LIPITOR) 40 MG tablet   benazepril (LOTENSIN) 20 MG tablet    Other Visit Diagnoses    Thoracic aortic aneurysm without rupture (HCC)       Relevant Medications   metoprolol succinate (TOPROL XL) 25 MG 24 hr tablet   atorvastatin (LIPITOR) 40 MG tablet   benazepril (LOTENSIN) 20 MG  tablet   Other Relevant Orders   CT ANGIO CHEST AORTA W/CM & OR WO/CM   Basic metabolic panel       [id number] Education: The signs and symptoms of COVID-19 were discussed with  the patient and how to seek care for testing (follow up with PCP or arrange E-visit).   The importance of social distancing and COVID-19 vaccination was discussed today.  I spent a total of 22 minutes with the patient. >  50% of the time was spent in direct patient consultation.  Additional time spent with chart review  / charting (studies, outside notes, etc): 8 Total Time: 30 min   Current medicines are reviewed at length with the patient today.  (+/- concerns) none  Notice: This dictation was prepared with Dragon dictation along with smaller phrase technology. Any transcriptional errors that result from this process are unintentional and may not be corrected upon review.  Patient Instructions / Medication Changes & Studies & Tests Ordered   Patient Instructions  Medication Instructions:   INCREASE METOPROLOL SUCCINATE ( TOPROL XL ) 75 MG   (25 MG X3 TABLETS TOTAL ) *If you need a refill on your cardiac medications before your next appointment, please call your pharmacy*   Lab Work:  [place]    If you have labs (blood work) drawn today and your tests are completely normal, you will receive your results only by: Marland Kitchen MyChart Message (if you have MyChart) OR . A paper copy in the mail If you have any lab test that is abnormal or we need to change your treatment, we will call you to review the results.   Testing/Procedures:  WILL BE SCHEDULE IN SEPT 2021 Non-Cardiac CT scanning, (CAT scanning), is a noninvasive, special x-ray that produces cross-sectional images of the body using x-rays and a computer. CT scans help physicians diagnose and treat medical conditions. For some CT exams, a contrast material is used to enhance visibility in the area of the body being studied. CT scans provide greater clarity and reveal more details than regular x-ray exams.    Follow-Up: At [facility], you and your health needs are our priority.  As part of our continuing mission to  provide you with exceptional heart care, we have created designated Provider Care Teams.  These Care Teams include your primary Cardiologist (physician) and Advanced Practice Providers (APPs -  Physician Assistants and Nurse Practitioners) who all work together to provide you with the care you need, when you need it.    Your next appointment:   7 month(s) DEC 2021  The format for your next appointment:   In Person  Provider:   Glenetta Hew, MD   Other Instructions     Studies Ordered:   Orders Placed This Encounter  Procedures  . CT ANGIO CHEST AORTA W/CM & OR WO/CM  . Basic metabolic panel  . EKG 12-Lead     Brooke Proctor, M.D., M.S. Interventional Cardiologist   Pager # [phone] Phone # [phone] [address] [place], [place] [address]   Thank you for choosing Heartcare at [facility]!!

## 2019-09-26 ENCOUNTER — Encounter: Payer: Self-pay | Admitting: Cardiology

## 2019-09-26 NOTE — Assessment & Plan Note (Signed)
Initially seen on echo.  Will have follow-up in September 2021 with CTA chest for more accurate finding.

## 2019-09-26 NOTE — Assessment & Plan Note (Addendum)
Apparently there is evidence of systolic anterior movement of the anterior leaflet of the mitral valve.  [facility]) this is usually seen with hypertrophic cardiomyopathy, but it could be related to hypertensive.    She does have a family history of IHSS (however this was not commented on during prior echoes)  Regardless, we will continue to treat accordingly.  Plan: We will titrate metoprolol titrate further.   Avoid dehydration.  (Avoid diuretic) Continue ACE inhibitor for now at current dose.

## 2019-09-26 NOTE — Assessment & Plan Note (Signed)
Labs reviewed above, LDL within goal.  Continue current dose of statin.  Tolerating well without any evidence of myalgias.

## 2019-09-26 NOTE — Assessment & Plan Note (Signed)
Not sure if this is hypertension related or truthfully HOCM/IHSS given same on echo.  Plan for now will be to avoid dehydration i.e. not using diuretic and continued blood pressure control.  Plan: Increase Toprol to 75 mg with low threshold to increase further to 100 mg at next visit.

## 2019-09-26 NOTE — Assessment & Plan Note (Signed)
Is now 5 years post non-STEMI with two-vessel PCI.  Doing well with no recurrent anginal symptoms.  Preserved EF on echo.

## 2019-09-26 NOTE — Assessment & Plan Note (Signed)
Discussed the importance of weight loss with diet and exercise.

## 2019-09-26 NOTE — Assessment & Plan Note (Signed)
History of PCI to both the LAD and RCA.  Doing well with no further anginal symptoms.  Continue beta-blocker, ACE inhibitor and statin.  Is on maintenance dose of Brilinta 60 mg twice daily -Okay to hold Brilinta 5-7 days preop for surgeries or procedures.

## 2019-09-26 NOTE — Assessment & Plan Note (Signed)
Blood pressure still high today.  With resting heart rate 74, I think we are going to titrate beta-blocker further.  Plan: Increase Toprol to 75 mg daily and continue benazepril.  Avoid diuretic

## 2019-09-27 ENCOUNTER — Other Ambulatory Visit: Payer: Self-pay

## 2019-09-28 ENCOUNTER — Other Ambulatory Visit: Payer: Self-pay

## 2019-09-28 MED ORDER — NITROGLYCERIN 0.4 MG SL SUBL: SUBLINGUAL_TABLET | SUBLINGUAL | 2 refills | Status: DC

## 2019-10-17 ENCOUNTER — Other Ambulatory Visit: Payer: Self-pay | Admitting: Cardiology

## 2019-10-27 ENCOUNTER — Telehealth: Payer: Self-pay | Admitting: Cardiology

## 2019-10-27 NOTE — Telephone Encounter (Signed)
New Message  Elberta Fortis from [facility] is calling about  potassium chloride SA (KLOR-CON) 20 MEQ tablet He says they dont have this strength of medication   Please call

## 2019-10-27 NOTE — Telephone Encounter (Signed)
Spoke to pharmacist from [facility] who report they do not have regular potassium chloride 20 meq (KLOR-CON), but have KLOR-CON M and wanted to know if it's ok to dispense that form. Nurse checked with Pharm D who gave approval for different form. Pharmacist updated and verbalized understanding.

## 2020-01-05 ENCOUNTER — Telehealth: Payer: Self-pay | Admitting: Cardiology

## 2020-01-05 NOTE — Telephone Encounter (Signed)
Left message for patient to call and discuss scheduling CTA chest/aorta ordered by Dr. Ellyn Hack

## 2020-01-23 ENCOUNTER — Inpatient Hospital Stay: Admission: RE | Admit: 2020-01-23 | Payer: Medicare Other | Source: Ambulatory Visit

## 2020-02-02 LAB — BASIC METABOLIC PANEL
BUN/Creatinine Ratio: 17 (ref 12–28)
BUN: 16 mg/dL (ref 8–27)
CO2: 25 mmol/L (ref 20–29)
Calcium: 9.9 mg/dL (ref 8.7–10.3)
Chloride: 104 mmol/L (ref 96–106)
Creatinine, Ser: 0.92 mg/dL (ref 0.57–1.00)
GFR calc Af Amer: 73 mL/min/{1.73_m2} (ref >59)
GFR calc non Af Amer: 64 mL/min/{1.73_m2} (ref >59)
Glucose: 85 mg/dL (ref 65–99)
Potassium: 5.2 mmol/L (ref 3.5–5.2)
Sodium: 141 mmol/L (ref 134–144)

## 2020-02-03 ENCOUNTER — Inpatient Hospital Stay: Admission: RE | Admit: 2020-02-03 | Payer: Medicare Other | Source: Ambulatory Visit

## 2020-02-06 ENCOUNTER — Inpatient Hospital Stay: Admit: 2020-02-05 | Payer: MEDICARE | Primary: Internal Medicine

## 2020-02-06 DIAGNOSIS — Z01812 Encounter for preprocedural laboratory examination: Secondary | ICD-10-CM

## 2020-02-07 LAB — COVID-19: SARS-CoV-2: NOT DETECTED

## 2020-02-07 LAB — SARS-COV-2: SARS-CoV-2: NOT DETECTED

## 2020-02-10 ENCOUNTER — Telehealth: Payer: Self-pay | Admitting: Cardiology

## 2020-02-10 ENCOUNTER — Inpatient Hospital Stay: Payer: MEDICARE

## 2020-02-10 MED ORDER — FLUMAZENIL 0.1 MG/ML IV SOLN
0.1 mg/mL | INTRAVENOUS | Status: DC | PRN
Start: 2020-02-10 — End: 2020-02-10

## 2020-02-10 MED ORDER — SODIUM CHLORIDE 0.9 % IJ SYRG
Freq: Three times a day (TID) | INTRAMUSCULAR | Status: DC
Start: 2020-02-10 — End: 2020-02-10

## 2020-02-10 MED ORDER — SODIUM CHLORIDE 0.9 % IV
INTRAVENOUS | Status: DC
Start: 2020-02-10 — End: 2020-02-10
  Administered 2020-02-10: 12:00:00 via INTRAVENOUS

## 2020-02-10 MED ORDER — LIDOCAINE (PF) 20 MG/ML (2 %) IJ SOLN
20 mg/mL (2 %) | INTRAMUSCULAR | Status: DC | PRN
Start: 2020-02-10 — End: 2020-02-10
  Administered 2020-02-10: 13:00:00 via INTRAVENOUS

## 2020-02-10 MED ORDER — SIMETHICONE 40 MG/0.6 ML ORAL DROPS, SUSP
40 mg/0.6 mL | ORAL | Status: DC | PRN
Start: 2020-02-10 — End: 2020-02-10

## 2020-02-10 MED ORDER — ATROPINE 0.1 MG/ML SYRINGE
0.1 mg/mL | Freq: Once | INTRAMUSCULAR | Status: DC | PRN
Start: 2020-02-10 — End: 2020-02-10

## 2020-02-10 MED ORDER — NALOXONE 0.4 MG/ML INJECTION
0.4 mg/mL | INTRAMUSCULAR | Status: DC | PRN
Start: 2020-02-10 — End: 2020-02-10

## 2020-02-10 MED ORDER — PROPOFOL 10 MG/ML IV EMUL
10 mg/mL | INTRAVENOUS | Status: DC | PRN
Start: 2020-02-10 — End: 2020-02-10
  Administered 2020-02-10 (×4): via INTRAVENOUS

## 2020-02-10 MED ORDER — EPINEPHRINE 0.1 MG/ML SYRINGE
0.1 mg/mL | Freq: Once | INTRAMUSCULAR | Status: DC | PRN
Start: 2020-02-10 — End: 2020-02-10

## 2020-02-10 MED ORDER — SODIUM CHLORIDE 0.9 % IJ SYRG
INTRAMUSCULAR | Status: DC | PRN
Start: 2020-02-10 — End: 2020-02-10

## 2020-02-10 MED FILL — BD POSIFLUSH NORMAL SALINE 0.9 % INJECTION SYRINGE: INTRAMUSCULAR | Qty: 40

## 2020-02-10 MED FILL — SODIUM CHLORIDE 0.9 % IV: INTRAVENOUS | Qty: 1000

## 2020-02-10 NOTE — Anesthesia Pre-Procedure Evaluation (Signed)
Anesthetic History   No history of anesthetic complications            Review of Systems / Medical History  Patient summary reviewed, nursing notes reviewed and pertinent labs reviewed    Pulmonary  Within defined limits                 Neuro/Psych   Within defined limits           Cardiovascular    Hypertension          Hyperlipidemia    Exercise tolerance: >4 METS     GI/Hepatic/Renal  Within defined limits              Endo/Other    Diabetes: well controlled, type 2    Arthritis     Other Findings              Physical Exam    Airway  Mallampati: II  TM Distance: 4 - 6 cm  Neck ROM: normal range of motion   Mouth opening: Normal     Cardiovascular    Rhythm: regular  Rate: normal         Dental  No notable dental hx       Pulmonary  Breath sounds clear to auscultation               Abdominal         Other Findings            Anesthetic Plan    ASA: 2  Anesthesia type: MAC          Induction: Intravenous  Anesthetic plan and risks discussed with: Patient

## 2020-02-10 NOTE — Anesthesia Post-Procedure Evaluation (Signed)
Procedure(s):  COLONOSCOPY.    MAC    Anesthesia Post Evaluation      Multimodal analgesia: multimodal analgesia used between 6 hours prior to anesthesia start to PACU discharge  Patient location during evaluation: PACU  Level of consciousness: sleepy but conscious  Pain management: adequate  Airway patency: patent  Anesthetic complications: no  Cardiovascular status: acceptable  Respiratory status: acceptable  Hydration status: acceptable  Comments: +Post-Anesthesia Evaluation and Assessment    Patient: Stephanie Randolph MRN: [id number]  SSN: [id number]   Date of Birth: 12-10-1950  Age: 69 y.o.  Sex: female      Cardiovascular Function/Vital Signs    BP 110/60    Pulse 62    Temp 36.3 ??C (97.3 ??F)    Resp 14    Ht _0  (1.702 m)    Wt 66.3 kg (146 lb 3.2 oz)    SpO2 96%    BMI 22.90 kg/m??     Patient is status post Procedure(s):  COLONOSCOPY.    Nausea/Vomiting: Controlled.    Postoperative hydration reviewed and adequate.    Pain:  Pain Scale 1: Numeric (0 - 10) (02/10/20 5176)  Pain Intensity 1: 0 (02/10/20 1607)   Managed.    Neurological Status:       At baseline.    Mental Status and Level of Consciousness: Arousable.    Pulmonary Status:   O2 Device: None (Room air) (02/10/20 0928)   Adequate oxygenation and airway patent.    Complications related to anesthesia: None    Post-anesthesia assessment completed. No concerns.    Signed By: Joyice Faster, MD    02/10/2020  Post anesthesia nausea and vomiting:  controlled  Final Post Anesthesia Temperature Assessment:  Normothermia (36.0-37.5 degrees C)      INITIAL Post-op Vital signs:   Vitals Value Taken Time   BP 114/54 02/10/20 0933   Temp 36.3 ??C (97.3 ??F) 02/10/20 0906   Pulse 62 02/10/20 0928   Resp 14 02/10/20 0928   SpO2 97 % 02/10/20 0933   Vitals shown include unvalidated device data.

## 2020-02-10 NOTE — H&P (Signed)
H&P by Herbert Moors., MD at 02/10/20 0830                Author: Herbert Moors., MD  Service: Gastroenterology  Author Type: Physician       Filed: 02/10/20 0835  Date of Service: 02/10/20 0830  Status: Signed          Editor: Herbert Moors., MD (Physician)                                                Elzie Rings, MD   [facility]   [address], [address]   [place], [place] [address]   [phone]   [email or web address]      Gastroenterology Outpatient History and Physical      Patient: Stephanie Randolph      Physician: Erenest Rasher Wyvonnia Dusky., MD      Vital Signs: Blood pressure 134/72, pulse 96, temperature 98.1  ??F (36.7 ??C), resp. rate 12, height 5\' 7"  (1.702 m), weight 66.3 kg (146 lb 3.2 oz), SpO2 99 %.      Allergies:      Allergies        Allergen  Reactions         ?  Ampicillin  Rash           Chief Complaint: Screening colonoscopy      History of Present Illness: Here for a screening colonoscopy.  Last colonoscopy was 2010 and showed no polyp. Currently has no GI symptoms.  No FH of colon cancer or polyps.      History:     Past Medical History:        Diagnosis  Date         ?  Arthritis            knees         ?  Diabetes (HCC)       ?  Hypercholesterolemia       ?  Hypertension       ?  Other ill-defined conditions(799.89)            hypercholesteremia           Past Surgical History:         Procedure  Laterality  Date          ?  HX GI              colonoscopies x2          ?  HX GYN              tubal ligation           ?  HX GYN              1 vaginal birth          ?  PR ABDOMEN SURGERY PROC UNLISTED    03/29/12          ROBOTIC ASSISTED CHOLECYSTECTOMY, WITH INTRAOPERTAIVE CHOLANGIOGRAM            Social History          Socioeconomic History         ?  Marital status:  MARRIED              Spouse name:  Not on file         ?  Number of children:  Not on file     ?  Years of education:  Not on file     ?   Highest education level:  Not on file       Tobacco Use         ?  Smoking status:  Never Smoker     ?  Smokeless tobacco:  Never Used       Vaping Use         ?  Vaping Use:  Never used       Substance and Sexual Activity         ?  Alcohol use:  No         ?  Drug use:  No          Social Determinants of Financial controller Strain:         ?  Difficulty of Paying Living Expenses:        Food Insecurity:         ?  Worried About Programme researcher, broadcasting/film/video in the Last Year:      ?  Barista in the Last Year:        Transportation Needs:         ?  Freight forwarder (Medical):      ?  Lack of Transportation (Non-Medical):        Physical Activity:         ?  Days of Exercise per Week:      ?  Minutes of Exercise per Session:        Stress:         ?  Feeling of Stress :        Social Connections:         ?  Frequency of Communication with Friends and Family:      ?  Frequency of Social Gatherings with Friends and Family:      ?  Attends Religious Services:      ?  Active Member of Clubs or Organizations:      ?  Attends Banker Meetings:         ?  Marital Status:            Family History         Problem  Relation  Age of Onset          ?  Hypertension  Mother       ?  Diabetes  Father       ?  Hypertension  Father       ?  Cancer  Maternal Aunt                breast cancer          ?  Cancer  Maternal Grandmother                unknown           Patient Active Problem List        Diagnosis  Code         ?  Gallstones  K80.20           Medications:      Prior to Admission medications             Medication  Sig  Start Date  End Date  Taking?  Authorizing Provider  metformin (GLUCOPHAGE) 500 mg tablet  Take 1,000 mg by mouth two (2) times a day.  10/18/09    Yes  Other, Phys, MD     simvastatin (ZOCOR) 40 mg tablet  Take 40 mg by mouth daily.      Yes  Other, Phys, MD     triamterene-hydrochlorothiazide (MAXZIDE) 37.5-25 mg per tablet  Take 1 Tab by mouth daily.      Yes   Other, Phys, MD     amlodipine (NORVASC) 5 mg tablet  Take 5 mg by mouth daily.      Yes  Other, Phys, MD            cholecalciferol, vitamin d3, (VITAMIN D3) 1,000 unit tablet  Take 1,000 Units by mouth daily.      Yes  Other, Phys, MD           Physical Exam:         General: well developed, well nourished     HEENT: unremarkable     Heart: regular rhythm no mumur      Lungs: clear     Abdominal:  benign     Neurological: unremarkable     Extremities: no edema        Findings/Diagnosis: Screening colonoscopy      Plan of Care/Planned Procedure: Colonoscopy with monitored anesthesia care sedation      Signed:   Herbert Moors., MD 02/10/2020

## 2020-02-10 NOTE — Progress Notes (Signed)
Endoscope was pre-cleaned at bedside immediately following procedure by Stephanie Hazelgrove.    Glasses returned to patient immediately post-procedure.

## 2020-02-10 NOTE — Procedures (Signed)
Procedures by Eugenie Filler., MD at 02/10/20 [phone]                Author: Eugenie Filler., MD  Service: Gastroenterology  Author Type: Physician       Filed: 02/10/20 0900  Date of Service: 02/10/20 0859  Status: Signed          Editor: Eugenie Filler., MD (Physician)            Pre-procedure Diagnoses        1. Colon cancer screening [Z12.11]                           Post-procedure Diagnoses        1. Internal hemorrhoids [K64.8]                           Procedures        1. PR COLON CA SCRN NOT HI [place] IND [[id number] ([place]                        Colonoscopy Operative Report      02/10/2020         TANIJA GERMANI   [id number]   03-26-1951      Procedure Type:   Colonoscopy --screening       Indications:    Screening colonoscopy       Pre-operative Diagnosis: see indication above      Post-operative Diagnosis:  See findings below      Operator:  Elmon Else, MD      Referring Provider: Parke Simmers, MD         Sedation:  MAC anesthesia Propofol      Pre-Procedural Exam:        Airway: clear,  No airway problems anticipated   Heart: RRR, without gallops or rubs   Lungs: clear bilaterally without wheezes, crackles, or rhonchi   Abdomen: soft, nontender, nondistended, bowel sounds present   Mental Status: awake, alert and oriented to person, place and time       Procedure Details:  After informed consent was obtained with all risks and benefits of procedure explained and preoperative exam completed,  the patient was taken to the endoscopy suite and placed in the left lateral decubitus position.  Upon sequential sedation as per above, a digital rectal exam was performed .  The Olympus videocolonoscope  was inserted in the rectum and carefully advanced  to the cecum, which was identified by the ileocecal valve and appendiceal orifice.  The cecum was identified by the ileocecal valve  and appendiceal  orifice.  The quality of preparation was good.  The colonoscope was slowly withdrawn with careful evaluation between folds. Retroflexion in the rectum was completed demonstrating internal  hemorrhoids.       Findings:    Rectum: Grade 1 internal hemorrhoid(s);   Sigmoid: normal   Descending Colon: normal   Transverse Colon: normal   Ascending Colon: normal   Cecum: normal   Terminal Ileum: not intubated         Specimen Removed:  none      Complications: None.  EBL:  None.      Impression:    normal colonic mucosa throughout   hemorrhoids internal, Moderate in size      Recommendations: --For colon cancer screening in this average-risk patient, colonoscopy may be repeated in 10 years. High fiber diet.   Resume normal medication(s).    Discharge Disposition:  Home in the company of a driver when able to ambulate.      Eugenie Filler., MD      02/10/2020       P. Jethro Bolus, MD   [facility] [place], [place] [address]   [phone]   [email or web address]

## 2020-02-10 NOTE — Telephone Encounter (Signed)
14 TABS OF BRIILINTA 60 MG  LEFT AT FRONT DESK FOR PICK UP. THIS WILL HOLD OVER UNTIL GETS REFILL ON Monday FROM MAIL ORDER .Adonis Housekeeper

## 2020-02-10 NOTE — Telephone Encounter (Signed)
Pt c/o medication issue:  1. Name of Medication: ticagrelor (BRILINTA) 60 MG TABS tablet  2. How are you currently taking this medication (dosage and times per day)?   3. Are you having a reaction (difficulty breathing--STAT)?   4. What is your medication issue? Patient is currently out of the medication [facility] can't get it to her until Monday, she hasn't taken it for the past two days, she wants to know how long can she be without taking it.

## 2020-02-17 ENCOUNTER — Inpatient Hospital Stay: Admission: RE | Admit: 2020-02-17 | Payer: Medicare Other | Source: Ambulatory Visit

## 2020-03-01 ENCOUNTER — Ambulatory Visit (INDEPENDENT_AMBULATORY_CARE_PROVIDER_SITE_OTHER)
Admission: RE | Admit: 2020-03-01 | Discharge: 2020-03-01 | Disposition: A | Discharge status: Home / Self Care | Payer: Medicare Other | Source: Ambulatory Visit | Attending: Cardiology | Admitting: Cardiology

## 2020-03-01 ENCOUNTER — Other Ambulatory Visit: Payer: Self-pay

## 2020-03-01 DIAGNOSIS — I712 Thoracic aortic aneurysm, without rupture, unspecified: Secondary | ICD-10-CM

## 2020-03-01 MED ORDER — IOHEXOL 350 MG/ML SOLN
100.0000 mL | Freq: Once | INTRAVENOUS | Status: AC | PRN
  Administered 2020-03-01: 100 mL via INTRAVENOUS

## 2020-03-08 ENCOUNTER — Telehealth: Payer: Self-pay | Admitting: *Deleted

## 2020-03-08 NOTE — Telephone Encounter (Signed)
-----   Message from Leonie Man, MD sent at 03/03/2020 11:25 PM EDT ----- Chest CT results show stable unchanged mild ectasia (dilation) of the ascending thoracic aorta.  Heart is normal size..  Maybe some mild left atrial enlargement.  The largest diameter of the ascending aorta is 40 mm (unchanged)  Good news.  We can follow-up potentially annually unless it is stable after couple years.  Glenetta Hew, MD

## 2020-03-08 NOTE — Telephone Encounter (Signed)
Left message to call back in regards to CT results  also  Need to schedule upcoming recall appointment for Dec 2021or Jan 2022

## 2020-03-20 NOTE — Telephone Encounter (Signed)
Called - left detailed message on voicemail per dpr ,  requested patient to call back to schedule recall a appointment for dec . - will need to be schedule in jan or feb at present time schedule is full

## 2020-03-21 NOTE — Telephone Encounter (Signed)
Patient returned call and scheduled an appointment for 05/25/20 at 10:40 AM with Dr. Ellyn Hack. Please return call to discuss CT results.  Patient states she would also like to discuss alternatives for Brilinta. She states she is in the donut hole and the medication will cost her $400.

## 2020-03-21 NOTE — Telephone Encounter (Signed)
Returned the call to the patient. She verbalized her understanding of the results and stated that for now she is fine with the Brilinta. Nothing further was needed.

## 2020-05-25 ENCOUNTER — Ambulatory Visit: Payer: Medicare Other | Admitting: Cardiology

## 2020-06-14 ENCOUNTER — Telehealth: Payer: Medicare Other | Admitting: Cardiology

## 2020-06-28 ENCOUNTER — Telehealth: Payer: Medicare Other | Admitting: Cardiology

## 2020-07-01 NOTE — Progress Notes (Signed)
Virtual Visit via Video Note   This visit type was conducted due to national recommendations for restrictions regarding the COVID-Proctor Pandemic (e.g. social distancing) in an effort to limit this patient's exposure and mitigate transmission in our community.  Due to her co-morbid illnesses, this patient is at least at moderate risk for complications without adequate follow up.  This format is felt to be most appropriate for this patient at this time.  All issues noted in this document were discussed and addressed.  A limited physical exam was performed with this format.  Please refer to the patient's chart for her consent to telehealth for [facility].  Video Connection Lost Video connection was lost at > 50% of the duration of this visit, at which time the remainder of the visit was completed via audio only.   The patient's video and audio would not connect.  We tried 3 times.  Patient has given verbal permission to conduct this visit via virtual appointment and to bill insurance 07/02/2020 12:59 PM     Evaluation Performed:  Follow-up visit  Date:  07/02/2020   ID:  Brooke Proctor, Brooke Proctor, Brooke Proctor, MRN [id number]  Patient Location: Home Provider Location: Home Office  PCP:  Nicola Girt, DO  Cardiologist:  Glenetta Hew, MD  Electrophysiologist:  None   Chief Complaint:   Chief Complaint  Patient presents with  . Follow-up    Doing well.  . Coronary Artery Disease    No angina   ====================================  ASSESSMENT & PLAN:    Problem List Items Addressed This Visit    History of non-STEMI (Chronic)    Almost 6 years out from her MI.  Thankfully, EF is preserved and no further angina or heart failure symptoms.  Also no longer having palpitations.      CAD S/P percutaneous coronary angioplasty (Chronic)    History of PCI to LAD and RCA.  No further angina.  Plan: Continue maintenance dose Brilinta 60 mg twice daily  -> okay to hold 5 days preop for  surgeries or procedures.      Hyperlipidemia with target LDL less than 70 (Chronic)    She has not had any labs checked since May 2021 visit.  If not checked by PCP in the next month or so, we will order CMP and FLP.  Plan:  Continue atorvastatin      Obesity (BMI 30.0-34.9) (Chronic)    The patient understands the need to lose weight with diet and exercise. We have discussed specific strategies for this.      Essential hypertension (Chronic)    Unfortunately, she did not have her blood pressures ready for today.  I have asked that she monitor her pressures over the next month or so and let us know what her blood pressure and heart rate are.  Low threshold to titrate up Toprol to 100 daily.  Otherwise continue current dose of benazepril.      Coronary artery disease involving native coronary artery of native heart with angina pectoris ([place]) - Primary (Chronic)    After having some chest pain issues for the first year or 2 after her MI, she has not had any further anginal symptoms.  All she notes exertional dyspnea with extreme exertion.  Plan: Needs to continue to increase level of activity.  Continue Toprol 75 mg daily (titrate up to 100 mg if BP or heart rate tolerate)  Continue current dose of benazepril and atorvastatin  On maintenance dose Brilinta (60  mg twice daily); okay to interrupt 5 days preop for surgery/procedures.       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  Continue Toprol and ACE inhibitor, avoid diuretic. Continue to ensure adequate hydration.      Thoracic aortic ectasia (HCC) (Chronic)    CTA chest showed diffuse 40 mm dilation of the aorta.  Recommendation is annual follow-up, however I think every 2 years is reasonable.        ====================================  History of Present Illness:     Brooke Proctor is a 70 y.o. 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   CardiacCath-PCI5/18/2016:20% p-mLAD, 90% mLAD,50% OM1, 50% OM 2, & 80% mRCA: DES PCI - 90% mid LAD & 80% mid RCA lesion treated with DES complicated by transient no flow phenomenon. ? Myoview 01/2017: no Ischemia or Infarct. ? TTE 06/2018:Normal EF 60 to 65%. LVH (septal). Mild anterior MI leaflet S.A.M. LVOT obstruction at rest (?Possible forme fruste of HOCM) -consider repeat study with Doppler and Valsalva LVOT gradient.. ? TTE 01/2019:EF 70-75%.Hyperkinetic LV. Severely increased septal wall. GR 1 DD. Normal biatrial size. No valve stenosis. Ascending aorta 43 mm.**  Brooke Proctor was last seen on Sep 22, 2019 -> she was doing well from cardiac standpoint. Active without any anginal symptoms. No palpitations. Brief dyspnea on lying down but not significant. As active as she was. Limited by OA pain.  Avoid dehydration,-diuretic. Increase Toprol to 75 mg blood pressure taken to monitor.  Hospitalizations:  . None  Recent - Interim CV studies:   The following studies were reviewed today:  CT Angio Chest-Aorta (03/01/2020): Unchanged diffuse ectasia of the ascending thoracic aorta measuring 40 mm. => Recommendation is annual follow-up.  Brooke Proctor History   Brooke Proctor is doing well with no complaints.  Only notes DOE with more strenuous activity - walking too fast or carrying something up several flights of stairs.  She denies any chest pain. Notes that she is just deconditioned & needs to exercise more.   No longer noting palpitations, dyspnea or dizziness when laying on her side.   Cardiovascular ROS: positive for - DOE with vigourous exertion - deconditioned negative for - chest pain, edema,  irregular heartbeat, orthopnea, palpitations, paroxysmal nocturnal dyspnea, rapid heart rate, shortness of breath or syncope/near syncope; TIA / CVA / amaurosis fugax, claudication   ROS:  Please see the history of present illness.    The patient does not have symptoms concerning for COVID-Proctor infection (fever, chills, cough, or new shortness of breath).  Review of Systems  Constitutional: Negative for malaise/fatigue and weight loss.  HENT: Negative for congestion and nosebleeds.   Eyes: Negative for blurred vision and double vision.  Respiratory: Positive for shortness of breath (only vigorous exertion).   Cardiovascular: Negative for chest pain, palpitations and leg swelling.  Gastrointestinal: Negative for abdominal pain, blood in stool, constipation and melena.  Genitourinary: Negative for hematuria.  Musculoskeletal: Negative for joint pain and neck pain.  Neurological: Positive for tingling (PN pain). Negative for dizziness, focal weakness, weakness and headaches.  Psychiatric/Behavioral: Negative for depression and memory loss. The patient is not nervous/anxious and does not have insomnia.    The  patient is practicing social distancing.  Past Medical History:  Diagnosis Date  . Breast cancer ([place])   . CAD S/P percutaneous coronary angioplasty    a. NSTEMI 09/2014: s/p DES to mLAD and mRCA. Normal EF.  . Essential hypertension   . History of radiation therapy   . Hyperlipidemia   . Hypertrophic cardiomyopathy (HCC) -> has relatives with IHSS 09/22/2019   TTE 01/2019: EF 70-75%.  Hyperkinetic LV.  Severely increased septal wall.  GR 1 DD.  Normal biatrial size.  No valve stenosis.  Ascending aorta 43 mm.**  . Mild valvular heart disease    -> Echo February 2020 suggested S.A.M. of anterior MV leaflet with LVOT obstruction at rest suggesting HOCM--repeat study with Valsalva did not suggest this.  Severely increased septal wall thickness was noted.  No evidence of S.A.M.  . NSTEMI  (non-ST elevated myocardial infarction) ([place]) 09/19/2014  . Obesity   . Status post chemotherapy   . Thyroid disease    Past Surgical History:  Procedure Laterality Date  . CARDIAC CATHETERIZATION N/A 09/20/2014   Procedure: Left Heart Cath and Coronary Angiography;  Surgeon: Peter M Martinique, MD;  Location: [facility];  Service: Cardiovascular; 80% mLAD, 90% mRCA  . CARDIAC CATHETERIZATION  09/20/2014   Procedure: Coronary Stent Intervention;  Surgeon: Peter M Martinique, MD;  Location: [facility];  Service: Cardiovascular;;mLAD Promus Premeir DES 2.5 mm x 16 mm , mRCA - Promus Premier DES 4.0 mm x 16 mm (4.5 mm)   . CARDIAC EVENT MONITOR  10/2015   SR & S Tachycardia. Occasional PVCs.  No arrhythmias  . LYMPH NODE DISSECTION    . NM MYOVIEW LTD  01/2017   Normal/low risk study.  No ischemia or infarction.  EF 67%.  . TRANSTHORACIC ECHOCARDIOGRAM  06/2018    Normal EF 60 to 65%.  LVH (septal).  Mild anterior MI leaflet S.A.M. LVOT obstruction at rest (?  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  . TRANSTHORACIC ECHOCARDIOGRAM  01/2019   Hyperdynamic LV.  Severely increased septal wall.  GR 1 DD.  Normal biatrial size.  No valvular valvular stenosis.  Ascending aorta dilated to 43 mm.-No evidence of S.A.M.     Current Meds  Medication Sig  . atorvastatin (LIPITOR) 40 MG tablet TAKE 1 TABLET(40 MG) BY MOUTH AT BEDTIME  . benazepril (LOTENSIN) 20 MG tablet Take 1 tablet (20 mg total) by mouth daily.  . Cholecalciferol (VITAMIN D3 SUPER STRENGTH) 50 MCG (2000 UT) TABS Take 2 tablets by mouth daily at 12 noon.  . IBUPROFEN PO Take 200 mg by mouth as needed.  Marland Kitchen levothyroxine (SYNTHROID, LEVOTHROID) 100 MCG tablet Take 100 mcg by mouth daily before breakfast.  . metoprolol succinate (TOPROL XL) 25 MG 24 hr tablet Take 3 tablets (75 mg total) by mouth daily.  . nitroGLYCERIN (NITROSTAT) 0.4 MG SL tablet DISSOLVE 1 TABLET UNDER THE  TONGUE EVERY 5 MINUTES AS  NEEDED FOR CHEST PAIN. MAX  OF 3 TABLETS IN 15 MINUTES. CALL 911 IF PAIN PERSISTS.  Marland Kitchen potassium chloride SA (KLOR-CON) 20 MEQ tablet TAKE 1 TABLET(20 MEQ) BY MOUTH DAILY  . ticagrelor (BRILINTA) 60 MG TABS tablet TAKE 1 TABLET(60 MG) BY MOUTH TWICE DAILY     Allergies:   Eggs or egg-derived products, Nickel, Adhesive [tape], and Latex   Social History   Tobacco Use  . Smoking status: Never Smoker  . Smokeless tobacco: Never Used  Substance Use Topics  . Alcohol use: No    Alcohol/week: 0.0 standard drinks  . Drug use: No     Family Hx: The patient's family history includes Hypertension in her father and mother; Hypertrophic cardiomyopathy in her daughter; Kidney disease in her father; Stroke in her mother. There is no history of CAD.   Labs/Other Tests and Data Reviewed:    EKG:  No ECG reviewed.  Recent Labs: 02/01/2020: BUN 16; Creatinine, Ser 0.92; Potassium 5.2; Sodium 141   Recent Lipid Panel [facility] Related to Lipid Profile Component 08/26/19 04/09/17  LDL Direct 55 86  Total Cholesterol 104 130  Triglycerides 44 95  HDL Cholesterol 36 35  Total Chol / HDL Cholesterol 2.9 3.7  Non-HDL Cholesterol 68  95    Wt Readings from Last 3 Encounters:  07/02/20 209 lb (94.8 kg)  09/22/19 210 lb 6.4 oz (95.4 kg)  03/18/19 207 lb (93.9 kg)     Objective:    Vital Signs:  Ht 5\' 6"  (1.676 m)   Wt 209 lb (94.8 kg)   BMI 33.73 kg/m   VITAL SIGNS:  reviewed pleasant female in no acute distress. A&O x 3.  normal Mood & Affect Non-labored respirations  ==========================================  COVID-Proctor Education: The signs and symptoms of COVID-Proctor were discussed with the patient and how to seek care for testing (follow up with PCP or arrange E-visit).   The importance of social distancing was discussed today.  Time:   Today, I have spent 22 minutes with the patient with telehealth technology discussing the  above problems.   An additional 16minutes spent charting (reviewing prior notes, hospital records, studies, labs etc.) => an additional 11 min was spent attempting to establish Video Conferencing -- patient was in "Chat Room" - but unable to engage Video or Sound (was using her work laptop - probably has a control block).  Total 43 minutes   Medication Adjustments/Labs and Tests Ordered: Current medicines are reviewed at length with the patient today.  Concerns regarding medicines are outlined above.   Patient Instructions  Medication Instructions:  NO CHANGES *If you need a refill on your cardiac medications before your next appointment, please call your pharmacy*   Lab Work:  You are due to have routine lab work done by your PCP in ~ April -- this should include Complete Metabolic Panel & Fasting Lipid Panel -- if not checked by PCP by May, Pls contact our office & we can order labs to be drawn here or @ PCP office.  If you have labs (blood work) drawn today and your tests are completely normal, you will receive your results only by: Marland Kitchen MyChart Message (if you have MyChart) OR . A paper copy in the mail If you have any lab test that is abnormal or we need to change your treatment, we will call you to review the results.   Testing/Procedures: n/a   Follow-Up: At [facility], you and your health needs are our priority.  As part of our continuing mission to provide you with exceptional heart care, we have created designated Provider Care Teams.  These Care Teams include your primary Cardiologist (physician) and Advanced Practice Providers (APPs -  Physician Assistants and Nurse Practitioners) who all work together to provide you with the care you need, when you need it.  We recommend signing up for the patient portal called "MyChart".  Sign up information is provided on this After Visit Summary.  MyChart is  used to connect with patients for Virtual Visits (Telemedicine).  Patients are  able to view lab/test results, encounter notes, upcoming appointments, etc.  Non-urgent messages can be sent to your provider as well.   To learn more about what you can do with MyChart, go to [email or web address].    Your next appointment:   1 year(s)  The format for your next appointment:   In Person  Provider:   Glenetta Hew, MD   Other Instructions Please see if you can find a way to check your Blood Pressure & Heart Rate 3-4 x per week for the next ~2 weeks = call in with your readings, so we can make sure your medications are appropriately dosed.  Keep exercising. Stay Hydrated. - 80-100 ounces of water (or water based drinks -- not sodas, tea or coffee) per day.     Signed, Glenetta Hew, MD  07/02/2020 12:59 PM    [place]

## 2020-07-02 ENCOUNTER — Telehealth (INDEPENDENT_AMBULATORY_CARE_PROVIDER_SITE_OTHER): Payer: Medicare Other | Admitting: Cardiology

## 2020-07-02 ENCOUNTER — Encounter: Payer: Self-pay | Admitting: Cardiology

## 2020-07-02 ENCOUNTER — Telehealth: Payer: Self-pay | Admitting: *Deleted

## 2020-07-02 VITALS — Ht 66.0 in | Wt 209.0 lb

## 2020-07-02 DIAGNOSIS — I25119 Atherosclerotic heart disease of native coronary artery with unspecified angina pectoris: Secondary | ICD-10-CM | POA: Diagnosis not present

## 2020-07-02 DIAGNOSIS — I10 Essential (primary) hypertension: Secondary | ICD-10-CM

## 2020-07-02 DIAGNOSIS — I2510 Atherosclerotic heart disease of native coronary artery without angina pectoris: Secondary | ICD-10-CM | POA: Diagnosis not present

## 2020-07-02 DIAGNOSIS — I422 Other hypertrophic cardiomyopathy: Secondary | ICD-10-CM

## 2020-07-02 DIAGNOSIS — E66811 Obesity, class 1: Secondary | ICD-10-CM

## 2020-07-02 DIAGNOSIS — E785 Hyperlipidemia, unspecified: Secondary | ICD-10-CM | POA: Diagnosis not present

## 2020-07-02 DIAGNOSIS — I77810 Thoracic aortic ectasia: Secondary | ICD-10-CM

## 2020-07-02 DIAGNOSIS — I252 Old myocardial infarction: Secondary | ICD-10-CM

## 2020-07-02 DIAGNOSIS — I38 Endocarditis, valve unspecified: Secondary | ICD-10-CM

## 2020-07-02 DIAGNOSIS — E669 Obesity, unspecified: Secondary | ICD-10-CM

## 2020-07-02 DIAGNOSIS — Z9861 Coronary angioplasty status: Secondary | ICD-10-CM

## 2020-07-02 NOTE — Assessment & Plan Note (Signed)
Almost 6 years out from her MI.  Thankfully, EF is preserved and no further angina or heart failure symptoms.  Also no longer having palpitations.

## 2020-07-02 NOTE — Telephone Encounter (Signed)
  Patient Consent for Virtual Visit         Brooke Proctor has provided verbal consent on 07/02/2020 for a virtual visit (video or telephone).   CONSENT FOR VIRTUAL VISIT FOR:  Brooke Proctor  By participating in this virtual visit I agree to the following:  I hereby voluntarily request, consent and authorize [facility] and its employed or contracted physicians, physician assistants, nurse practitioners or other licensed health care professionals (the Practitioner), to provide me with telemedicine health care services (the "Services") as deemed necessary by the treating Practitioner. I acknowledge and consent to receive the Services by the Practitioner via telemedicine. I understand that the telemedicine visit will involve communicating with the Practitioner through live audiovisual communication technology and the disclosure of certain medical information by electronic transmission. I acknowledge that I have been given the opportunity to request an in-person assessment or other available alternative prior to the telemedicine visit and am voluntarily participating in the telemedicine visit.  I understand that I have the right to withhold or withdraw my consent to the use of telemedicine in the course of my care at any time, without affecting my right to future care or treatment, and that the Practitioner or I may terminate the telemedicine visit at any time. I understand that I have the right to inspect all information obtained and/or recorded in the course of the telemedicine visit and may receive copies of available information for a reasonable fee.  I understand that some of the potential risks of receiving the Services via telemedicine include:  Marland Kitchen Delay or interruption in medical evaluation due to technological equipment failure or disruption; . Information transmitted may not be sufficient (e.g. poor resolution of images) to allow for appropriate medical decision making by the  Practitioner; and/or  . In rare instances, security protocols could fail, causing a breach of personal health information.  Furthermore, I acknowledge that it is my responsibility to provide information about my medical history, conditions and care that is complete and accurate to the best of my ability. I acknowledge that Practitioner's advice, recommendations, and/or decision may be based on factors not within their control, such as incomplete or inaccurate data provided by me or distortions of diagnostic images or specimens that may result from electronic transmissions. I understand that the practice of medicine is not an exact science and that Practitioner makes no warranties or guarantees regarding treatment outcomes. I acknowledge that a copy of this consent can be made available to me via my patient portal ([place]), or I can request a printed copy by calling the office of [facility].    I understand that my insurance will be billed for this visit.   I have read or had this consent read to me. . I understand the contents of this consent, which adequately explains the benefits and risks of the Services being provided via telemedicine.  . I have been provided ample opportunity to ask questions regarding this consent and the Services and have had my questions answered to my satisfaction. . I give my informed consent for the services to be provided through the use of telemedicine in my medical care

## 2020-07-02 NOTE — Assessment & Plan Note (Addendum)
After having some chest pain issues for the first year or 2 after her MI, she has not had any further anginal symptoms.  All she notes exertional dyspnea with extreme exertion.  Plan: Needs to continue to increase level of activity.  Continue Toprol 75 mg daily (titrate up to 100 mg if BP or heart rate tolerate)  Continue current dose of benazepril and atorvastatin  On maintenance dose Brilinta (60 mg twice daily); okay to interrupt 5 days preop for surgery/procedures.

## 2020-07-02 NOTE — Telephone Encounter (Signed)
RN spoke to patient. Instruction were given  from today's virtual visit 07/02/20.  AVS SUMMARY has been sent by mychart .   Patient verbalized understanding

## 2020-07-02 NOTE — Patient Instructions (Addendum)
Medication Instructions:  NO CHANGES *If you need a refill on your cardiac medications before your next appointment, please call your pharmacy*   Lab Work:  You are due to have routine lab work done by your PCP in ~ April -- this should include Complete Metabolic Panel & Fasting Lipid Panel -- if not checked by PCP by May, Pls contact our office & we can order labs to be drawn here or @ PCP office.  If you have labs (blood work) drawn today and your tests are completely normal, you will receive your results only by: Marland Kitchen MyChart Message (if you have MyChart) OR . A paper copy in the mail If you have any lab test that is abnormal or we need to change your treatment, we will call you to review the results.   Testing/Procedures: n/a   Follow-Up: At [facility], you and your health needs are our priority.  As part of our continuing mission to provide you with exceptional heart care, we have created designated Provider Care Teams.  These Care Teams include your primary Cardiologist (physician) and Advanced Practice Providers (APPs -  Physician Assistants and Nurse Practitioners) who all work together to provide you with the care you need, when you need it.  We recommend signing up for the patient portal called "MyChart".  Sign up information is provided on this After Visit Summary.  MyChart is used to connect with patients for Virtual Visits (Telemedicine).  Patients are able to view lab/test results, encounter notes, upcoming appointments, etc.  Non-urgent messages can be sent to your provider as well.   To learn more about what you can do with MyChart, go to [email or web address].    Your next appointment:   1 year(s)  The format for your next appointment:   In Person  Provider:   Glenetta Hew, MD   Other Instructions Please see if you can find a way to check your Blood Pressure & Heart Rate 3-4 x per week for the next ~2 weeks = call in with your readings, so we can make sure  your medications are appropriately dosed.  Keep exercising. Stay Hydrated. - 80-100 ounces of water (or water based drinks -- not sodas, tea or coffee) per day.

## 2020-07-02 NOTE — Assessment & Plan Note (Signed)
She has not had any labs checked since May 2021 visit.  If not checked by PCP in the next month or so, we will order CMP and FLP.  Plan:  Continue atorvastatin

## 2020-07-02 NOTE — Assessment & Plan Note (Signed)
History of PCI to LAD and RCA.  No further angina.  Plan: Continue maintenance dose Brilinta 60 mg twice daily  -> okay to hold 5 days preop for surgeries or procedures.

## 2020-07-02 NOTE — Assessment & Plan Note (Signed)
The patient understands the need to lose weight with diet and exercise. We have discussed specific strategies for this.  

## 2020-07-02 NOTE — Assessment & Plan Note (Signed)
Unfortunately, she did not have her blood pressures ready for today.  I have asked that she monitor her pressures over the next month or so and let us know what her blood pressure and heart rate are.  Low threshold to titrate up Toprol to 100 daily.  Otherwise continue current dose of benazepril.

## 2020-07-02 NOTE — Assessment & Plan Note (Signed)
CTA chest showed diffuse 40 mm dilation of the aorta.  Recommendation is annual follow-up, however I think every 2 years is reasonable.

## 2020-07-02 NOTE — Assessment & Plan Note (Signed)
I find it unusual that she is at 3 echoes that are not consistent with this diagnosis.  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  Continue Toprol and ACE inhibitor, avoid diuretic. Continue to ensure adequate hydration.

## 2020-07-05 ENCOUNTER — Other Ambulatory Visit: Payer: Self-pay | Admitting: Cardiology

## 2020-09-04 ENCOUNTER — Other Ambulatory Visit: Payer: Self-pay | Admitting: Cardiology

## 2020-11-20 ENCOUNTER — Other Ambulatory Visit: Payer: Self-pay | Admitting: Cardiology

## 2020-11-22 ENCOUNTER — Telehealth: Payer: Self-pay | Admitting: *Deleted

## 2020-11-22 ENCOUNTER — Telehealth: Payer: Self-pay | Admitting: Cardiology

## 2020-11-22 MED ORDER — TICAGRELOR 60 MG PO TABS
60.0000 mg | ORAL_TABLET | Freq: Two times a day (BID) | ORAL | 3 refills | Status: DC
Start: 2020-11-22 — End: 2020-11-22

## 2020-11-22 MED ORDER — TICAGRELOR 60 MG PO TABS
60.0000 mg | ORAL_TABLET | Freq: Two times a day (BID) | ORAL | 1 refills | Status: DC
Start: 2020-11-22 — End: 2020-11-22

## 2020-11-22 MED ORDER — TICAGRELOR 60 MG PO TABS
60.0000 mg | ORAL_TABLET | Freq: Two times a day (BID) | ORAL | 0 refills | Status: DC
Start: 2020-11-22 — End: 2022-02-13

## 2020-11-22 NOTE — Telephone Encounter (Signed)
Patient called back - states   mail order has not received an order for Brilinta 60 mg twice a day.   RN e-sent a prescription to mail order.  ( Saving card given as well. Samples are available for pick up by patient. Patient states she will pick up on Friday.    RN called [facility] - spoke to a pharmacist [place]- she states the prescription  is in process

## 2020-11-22 NOTE — Telephone Encounter (Signed)
*  STAT* If patient is at the pharmacy, call can be transferred to refill team.   1. Which medications need to be refilled? (please list name of each medication and dose if known) ticagrelor (BRILINTA) 60 MG TABS tablet  2. Which pharmacy/location (including street and city if local pharmacy) is medication to be sent to? [facility]  ([place], [place]  3. Do they need a 30 day or 90 day supply? [place]

## 2020-11-22 NOTE — Telephone Encounter (Signed)
Refills has been sent.

## 2020-11-22 NOTE — Telephone Encounter (Signed)
Left message for patient to call back -  Received message  from patient needing a prescription refilled.   Brilinta 60 mg was refilled 11/20/20. Awaiting to find out which medication is needed.

## 2020-11-22 NOTE — Telephone Encounter (Signed)
Left message to cal back 

## 2021-01-14 ENCOUNTER — Encounter

## 2021-01-23 ENCOUNTER — Inpatient Hospital Stay: Admit: 2021-01-23 | Payer: MEDICARE | Attending: Internal Medicine | Primary: Internal Medicine

## 2021-04-22 ENCOUNTER — Other Ambulatory Visit: Payer: Self-pay

## 2021-04-22 MED ORDER — NITROGLYCERIN 0.4 MG SL SUBL: SUBLINGUAL_TABLET | SUBLINGUAL | 2 refills | Status: DC

## 2021-05-02 ENCOUNTER — Encounter

## 2021-05-20 ENCOUNTER — Inpatient Hospital Stay: Admit: 2021-05-20 | Payer: MEDICARE | Attending: Family | Primary: Internal Medicine

## 2021-05-20 DIAGNOSIS — K862 Cyst of pancreas: Secondary | ICD-10-CM

## 2021-05-20 MED ORDER — GADOBENATE DIMEGLUMINE 529 MG/ML (0.1 MMOL/0.2 ML) IV
529 mg/mL (0.1mmol/0.2mL) | Freq: Once | INTRAVENOUS | Status: AC
Start: 2021-05-20 — End: 2021-05-20
  Administered 2021-05-20: 22:00:00 via INTRAVENOUS

## 2021-08-06 ENCOUNTER — Other Ambulatory Visit: Payer: Self-pay | Admitting: Cardiology

## 2021-10-18 ENCOUNTER — Telehealth: Payer: Self-pay | Admitting: Cardiology

## 2021-10-18 NOTE — Telephone Encounter (Signed)
Patient calling the office for samples of medication:   1.  What medication and dosage are you requesting samples for?  ticagrelor (BRILINTA) 60 MG TABS tablet  2.  Are you currently out of this medication? Yes

## 2021-10-18 NOTE — Telephone Encounter (Signed)
Patient requesting samples of brilinta 60 mg bid. We are out of samples. Her last dose is today. She stated she will have [facility] expedite her prescription. Made her 12 month appointment with Janan Ridge, PA-C this month.

## 2021-10-21 ENCOUNTER — Other Ambulatory Visit: Payer: Self-pay | Admitting: Cardiology

## 2021-10-29 ENCOUNTER — Encounter: Payer: Self-pay | Admitting: Physician Assistant

## 2021-10-29 ENCOUNTER — Telehealth: Payer: Self-pay | Admitting: Cardiology

## 2021-10-29 ENCOUNTER — Ambulatory Visit: Payer: Medicare Other | Admitting: Physician Assistant

## 2021-10-29 VITALS — BP 156/89 | HR 67 | Ht 66.0 in | Wt 213.6 lb

## 2021-10-29 DIAGNOSIS — I25119 Atherosclerotic heart disease of native coronary artery with unspecified angina pectoris: Secondary | ICD-10-CM | POA: Diagnosis not present

## 2021-10-29 DIAGNOSIS — I10 Essential (primary) hypertension: Secondary | ICD-10-CM

## 2021-10-29 DIAGNOSIS — E785 Hyperlipidemia, unspecified: Secondary | ICD-10-CM | POA: Diagnosis not present

## 2021-10-29 DIAGNOSIS — R42 Dizziness and giddiness: Secondary | ICD-10-CM

## 2021-10-29 DIAGNOSIS — I2510 Atherosclerotic heart disease of native coronary artery without angina pectoris: Secondary | ICD-10-CM

## 2021-10-29 DIAGNOSIS — Z79899 Other long term (current) drug therapy: Secondary | ICD-10-CM | POA: Diagnosis not present

## 2021-10-29 DIAGNOSIS — I77810 Thoracic aortic ectasia: Secondary | ICD-10-CM

## 2021-10-29 MED ORDER — BENAZEPRIL HCL 40 MG PO TABS: 40.0000 mg | ORAL_TABLET | Freq: Every day | ORAL | 3 refills | Status: DC

## 2021-10-29 NOTE — Progress Notes (Signed)
Cardiology Office Note:    Date:  10/31/2021   ID:  Brooke Proctor, DOB [birth year], MRN [id number]  PCP:  Nicola Girt DO   [facility] Cardiologist:  Glenetta Hew, MD     Referring MD: Nicola Girt, DO   Chief Complaint  Patient presents with   Follow-up    Seen for Dr. Ellyn Hack.     History of Present Illness:    Brooke Proctor is a 71 y.o. female with a hx of CAD, hypertension, hyperlipidemia and obesity.  Patient had NSTEMI in 2016, cardiac catheterization at the time revealed a 20% proximal to mid LAD, 50% OM1, 50% OM2, 90% mid LAD lesion treated with DES, 80% mid RCA lesion treated with DES complicated by transient no flow phenomena.  Myoview performed in September 2018 showed no ischemia or infarction.  TTE obtained in February 2020 showed EF 60 to 65%, septal LVH, mild SAMT LVOT obstruction at rest.  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  Dr. Ellyn Hack has reviewed the previous echocardiogram who felt the patient likely has hypertension and hypertrophic cardiomyopathy rather than HOCM.  Last CTA obtained in October 2021 showed dilated ascending aorta measuring at 40 mm.  Although radiologist recommended annual imaging for follow-up, previous cardiology note indicated Dr. Ellyn Hack was planning to repeat image every 2-year unless significant change.  Patient presents today for follow-up.  She denies any recent chest pain.  She does have some fatigue over the past 6 months.  EKG is unchanged.  Blood pressure remains elevated, I recommended increasing benazepril to 40 mg daily.  In the recent weeks, she has been noticing episodes of brief dizzy spell that occurs about 1-2 times per week.  It typically occurs while she is either walking or standing and not preceded by any change in the body position.   Symptom resolved by itself.  She denies any presyncope or syncope.  I asked the patient to continue to monitor her symptom, if she started having more prolonged and the more frequent episode of dizziness, I would highly recommend heart monitor.  Otherwise, she is overdue for CMP, CBC, TSH, fasting lipid panel and a hemoglobin A1c.   Past Medical History:  Diagnosis Date   Breast cancer ([place])    CAD S/P percutaneous coronary angioplasty    a. NSTEMI 09/2014: s/p DES to mLAD and mRCA. Normal EF.   Essential hypertension    History of radiation therapy    Hyperlipidemia    Hypertrophic cardiomyopathy (HCC) -> has relatives with IHSS 09/22/2019   TTE 01/2019: EF 70-75%.  Hyperkinetic LV.  Severely increased septal wall.  GR 1 DD.  Normal biatrial size.  No valve stenosis.  Ascending aorta 43 mm.**   Mild valvular heart disease    -> Echo February 2020 suggested S.A.M. of anterior MV leaflet with LVOT obstruction at rest suggesting HOCM--repeat study with Valsalva did not suggest this.  Severely increased septal wall thickness was noted.  No evidence of S.A.M.   NSTEMI (non-ST elevated myocardial infarction) ([place]) 09/19/2014   Obesity    Status post chemotherapy    Thyroid disease     Past Surgical History:  Procedure Laterality Date   CARDIAC CATHETERIZATION N/A 09/20/2014   Procedure: Left Heart Cath and Coronary Angiography;  Surgeon: Peter M Martinique, MD;  Location: [facility];  Service: Cardiovascular; 80% mLAD, 90% mRCA   CARDIAC CATHETERIZATION  09/20/2014   Procedure: Coronary Stent Intervention;  Surgeon: Peter M Martinique, MD;  Location: [facility];  Service: Cardiovascular;;mLAD Promus Premeir DES 2.5 mm x 16 mm , mRCA - Promus Premier DES 4.0 mm x 16 mm (4.5 mm)    CARDIAC EVENT MONITOR  10/2015   SR & S Tachycardia. Occasional PVCs.  No arrhythmias   LYMPH NODE DISSECTION     NM MYOVIEW LTD  01/2017   Normal/low risk study.  No ischemia or infarction.  EF 67%.    TRANSTHORACIC ECHOCARDIOGRAM  06/2018    Normal EF 60 to 65%.  LVH (septal).  Mild anterior MI leaflet S.A.M. LVOT obstruction at rest (?  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   TRANSTHORACIC ECHOCARDIOGRAM  01/2019   Hyperdynamic LV.  Severely increased septal wall.  GR 1 DD.  Normal biatrial size.  No valvular valvular stenosis.  Ascending aorta dilated to 43 mm.-No evidence of S.A.M.    Current Medications: Current Meds  Medication Sig   atorvastatin (LIPITOR) 40 MG tablet TAKE 1 TABLET BY MOUTH AT  BEDTIME   levothyroxine (SYNTHROID, LEVOTHROID) 100 MCG tablet Take 100 mcg by mouth daily before breakfast.   metoprolol succinate (TOPROL-XL) 25 MG 24 hr tablet TAKE 3 TABLETS BY MOUTH  DAILY   potassium chloride SA (KLOR-CON M) 20 MEQ tablet TAKE 1 TABLET BY MOUTH  DAILY   ticagrelor (BRILINTA) 60 MG TABS tablet Take 1 tablet (60 mg total) by mouth 2 (two) times daily.   [DISCONTINUED] benazepril (LOTENSIN) 40 MG tablet Take 40 mg by mouth daily.     Allergies:   Eggs or egg-derived products, Nickel, Adhesive [tape], and Latex   Social History   Socioeconomic History   Marital status: Divorced    Spouse name: Not on file   Number of children: Not on file   Years of education: Not on file   Highest education level: Not on file  Occupational History   Not on file  Tobacco Use   Smoking status: Never   Smokeless tobacco: Never  Substance and Sexual Activity   Alcohol use: No    Alcohol/week: 0.0 standard drinks of alcohol   Drug use: No   Sexual activity: Not on file  Other Topics Concern   Not on file  Social History Narrative   Not on file   Social Determinants of Health   Financial Resource Strain: Not on file  Food Insecurity: Not on file  Transportation Needs: Not on file  Physical Activity: Not on file  Stress: Not on file  Social Connections: Not on file     Family History: The patient's family  history includes Hypertension in her father and mother; Hypertrophic cardiomyopathy in her daughter; Kidney disease in her father; Stroke in her mother. There is no history of CAD.  ROS:   Please see the history of present illness.     All other systems reviewed and are negative.  EKGs/Labs/Other Studies Reviewed:    The following studies were reviewed today:  Limited echo 01/25/2019  1. Left ventricular ejection fraction, by visual estimation, is 70 to  75%. The left ventricle has normal function. Normal left ventricular size.  Left ventricular septal wall thickness was severely increased. There is  mildly increased left ventricular  hypertrophy.   2. Left ventricular diastolic Doppler parameters are consistent with  impaired relaxation pattern of  LV diastolic filling.   3. Global right ventricle has normal systolic function.The right  ventricular size is normal. No increase in right ventricular wall  thickness.   4. Left atrial size was normal.   5. Right atrial size was normal.   6. The mitral valve is normal in structure. Mild mitral valve  regurgitation. No evidence of mitral stenosis.   7. The tricuspid valve is normal in structure. Tricuspid valve  regurgitation was not visualized by color flow Doppler.   8. The aortic valve is normal in structure. Aortic valve regurgitation  was not visualized by color flow Doppler. Structurally normal aortic  valve, with no evidence of sclerosis or stenosis.   9. The pulmonic valve was normal in structure. Pulmonic valve  regurgitation is not visualized by color flow Doppler.  10. Aneurysm of the ascending aorta, measuring 43 mm.  11. Mildly elevated pulmonary artery systolic pressure.  12. The tricuspid regurgitant velocity is 2.65 m/s, and with an assumed  right atrial pressure of 3 mmHg, the estimated right ventricular systolic  pressure is mildly elevated at 31.1 mmHg.  13. The inferior vena cava is normal in size with greater than  50%  respiratory variability, suggesting right atrial pressure of 3 mmHg.   EKG:  EKG is ordered today.  The ekg ordered today demonstrates normal sinus rhythm, right bundle branch block, left anterior fascicular block.  Recent Labs: 10/31/2021: ALT WILL FOLLOW; BUN WILL FOLLOW; Creatinine, Ser WILL FOLLOW; Hemoglobin 12.7; Platelets 235; Potassium WILL FOLLOW; Sodium WILL FOLLOW; TSH WILL FOLLOW  Recent Lipid Panel    Component Value Date/Time   CHOL WILL FOLLOW 10/31/2021 0841   TRIG WILL FOLLOW 10/31/2021 0841   HDL WILL FOLLOW 10/31/2021 0841   CHOLHDL WILL FOLLOW 10/31/2021 0841   CHOLHDL 2.6 10/23/2015 1105   VLDL 12 10/23/2015 1105   [place] WILL FOLLOW 10/31/2021 0841     Risk Assessment/Calculations:           Physical Exam:    VS:  BP (!) 156/89 (BP Location: Right Arm, Patient Position: Sitting, Cuff Size: Large)   Pulse 67   Ht '5\' 6"'$  (1.676 m)   Wt 213 lb 9.6 oz (96.9 kg)   SpO2 96%   BMI 34.48 kg/m     Wt Readings from Last 3 Encounters:  10/29/21 213 lb 9.6 oz (96.9 kg)  07/02/20 209 lb (94.8 kg)  09/22/19 210 lb 6.4 oz (95.4 kg)     GEN:  Well nourished, well developed in no acute distress HEENT: Normal NECK: No JVD; No carotid bruits LYMPHATICS: No lymphadenopathy CARDIAC: RRR, no murmurs, rubs, gallops RESPIRATORY:  Clear to auscultation without rales, wheezing or rhonchi  ABDOMEN: Soft, non-tender, non-distended MUSCULOSKELETAL:  No edema; No deformity  SKIN: Warm and dry NEUROLOGIC:  Alert and oriented x 3 PSYCHIATRIC:  Normal affect   ASSESSMENT:    1. Coronary artery disease involving native coronary artery of native heart with angina pectoris ([place])   2. Primary hypertension   3. Medication management   4. Hyperlipidemia LDL goal <70   5. Thoracic aortic ectasia (HCC)   6. Dizziness    PLAN:    In order of problems listed above:  CAD: Denies any recent chest pain.  Overdue for annual blood work including lipid panel, CMP, CBC, TSH  and hemoglobin A1c  Hypertension: Blood pressure elevated, increase benazepril to 40 mg daily  Hyperlipidemia: On Lipitor  Thoracic aortic aneurysm: CTA of the chest obtained in a.m. October  2021 demonstrated 40 mm ascending thoracic aortic aneurysm.  Obtain repeat study on the next visit  Episodic dizziness: Self resolving, does not associated with body position changes.  Typically only last a few seconds at a time.  We will continue to monitor.  If symptom worsens, may consider heart monitor.           Medication Adjustments/Labs and Tests Ordered: Current medicines are reviewed at length with the patient today.  Concerns regarding medicines are outlined above.  Orders Placed This Encounter  Procedures   Lipid panel   Comprehensive metabolic panel   CBC   TSH   Hemoglobin A1c   EKG 12-Lead   Meds ordered this encounter  Medications   benazepril (LOTENSIN) 40 MG tablet    Sig: Take 1 tablet (40 mg total) by mouth daily.    Dispense:  90 tablet    Refill:  3    Patient Instructions  Medication Instructions:  Increase Benazepril 40 mg daily  *If you need a refill on your cardiac medications before your next appointment, please call your pharmacy*  Lab Work: Your physician recommends that you return for lab work :  CMP CBC Fasting Lipid Panel TSH Hbg A1C  If you have labs (blood work) drawn today and your tests are completely normal, you will receive your results only by: [place] (if you have MyChart) OR A paper copy in the mail If you have any lab test that is abnormal or we need to change your treatment, we will call you to review the results.  Testing/Procedures: NONE ordered at this time of appointment    Follow-Up: At [facility], you and your health needs are our priority.  As part of our continuing mission to provide you with exceptional heart care, we have created designated Provider Care Teams.  These Care Teams include your primary  Cardiologist (physician) and Advanced Practice Providers (APPs -  Physician Assistants and Nurse Practitioners) who all work together to provide you with the care you need, when you need it.  Your next appointment:   1 year(s)  The format for your next appointment:   In Person  Provider:   Glenetta Hew, MD     Other Instructions   Important Information About Sugar         Signed, Almyra Deforest, [place]  10/31/2021 11:05 PM    [place]

## 2021-10-31 ENCOUNTER — Encounter: Payer: Self-pay | Admitting: Physician Assistant

## 2021-11-01 LAB — COMPREHENSIVE METABOLIC PANEL
ALT: 15 IU/L (ref 0–32)
AST: 18 IU/L (ref 0–40)
Albumin/Globulin Ratio: 1.4 (ref 1.2–2.2)
Albumin: 3.8 g/dL (ref 3.8–4.8)
Alkaline Phosphatase: 99 IU/L (ref 44–121)
BUN/Creatinine Ratio: 13 (ref 12–28)
BUN: 12 mg/dL (ref 8–27)
Bilirubin Total: 0.6 mg/dL (ref 0.0–1.2)
CO2: 23 mmol/L (ref 20–29)
Calcium: 9.5 mg/dL (ref 8.7–10.3)
Chloride: 107 mmol/L — ABNORMAL HIGH (ref 96–106)
Creatinine, Ser: 0.93 mg/dL (ref 0.57–1.00)
Globulin, Total: 2.8 g/dL (ref 1.5–4.5)
Glucose: 100 mg/dL — ABNORMAL HIGH (ref 70–99)
Potassium: 4.4 mmol/L (ref 3.5–5.2)
Sodium: 143 mmol/L (ref 134–144)
Total Protein: 6.6 g/dL (ref 6.0–8.5)
eGFR: 66 mL/min/{1.73_m2} (ref >59)

## 2021-11-01 LAB — LIPID PANEL
Chol/HDL Ratio: 2.8 ratio (ref 0.0–4.4)
Cholesterol, Total: 124 mg/dL (ref 100–199)
HDL: 44 mg/dL (ref >39)
LDL Chol Calc (NIH): 67 mg/dL (ref 0–99)
Triglycerides: 60 mg/dL (ref 0–149)
VLDL Cholesterol Cal: 13 mg/dL (ref 5–40)

## 2021-11-01 LAB — TSH: TSH: 1.2 u[IU]/mL (ref 0.450–4.500)

## 2021-11-01 LAB — CBC
Hematocrit: 38.2 % (ref 34.0–46.6)
Hemoglobin: 12.7 g/dL (ref 11.1–15.9)
MCH: 28.5 pg (ref 26.6–33.0)
MCHC: 33.2 g/dL (ref 31.5–35.7)
MCV: 86 fL (ref 79–97)
Platelets: 235 10*3/uL (ref 150–450)
RBC: 4.46 x10E6/uL (ref 3.77–5.28)
RDW: 11.1 % — ABNORMAL LOW (ref 11.7–15.4)
WBC: 3.9 10*3/uL (ref 3.4–10.8)

## 2021-11-01 LAB — HEMOGLOBIN A1C
Est. average glucose Bld gHb Est-mCnc: 117 mg/dL
Hgb A1c MFr Bld: 5.7 % — ABNORMAL HIGH (ref 4.8–5.6)

## 2021-12-20 ENCOUNTER — Other Ambulatory Visit: Payer: Self-pay | Admitting: Cardiology

## 2022-02-13 ENCOUNTER — Telehealth: Payer: Self-pay | Admitting: Cardiology

## 2022-02-13 MED ORDER — TICAGRELOR 60 MG PO TABS: 60.0000 mg | ORAL_TABLET | Freq: Two times a day (BID) | ORAL | 3 refills | Status: DC

## 2022-02-13 NOTE — Telephone Encounter (Signed)
*  STAT* If patient is at the pharmacy, call can be transferred to refill team.   1. Which medications need to be refilled? (please list name of each medication and dose if known) ticagrelor (BRILINTA) 60 MG TABS tablet  2. Which pharmacy/location (including street and city if local pharmacy) is medication to be sent to? [facility] ([place], [place]  3. Do they need a 30 day or 90 day supply? 90 day   Patient only has 4 pills left.

## 2022-02-14 MED ORDER — TICAGRELOR 60 MG PO TABS: 60.0000 mg | ORAL_TABLET | Freq: Two times a day (BID) | ORAL | 3 refills | Status: DC

## 2022-02-14 NOTE — Telephone Encounter (Signed)
  Pt said, she spoke with [facility] and was advised they did not receive the refill sent yesterday. Pt request if we can resent refill today

## 2022-02-14 NOTE — Addendum Note (Signed)
Addended by: Levonne Hubert on: 02/14/2022 11:53 AM   Modules accepted: Orders

## 2022-02-14 NOTE — Telephone Encounter (Signed)
Refill resent to pharmacy.

## 2022-02-19 ENCOUNTER — Other Ambulatory Visit: Payer: Self-pay

## 2022-02-19 MED ORDER — TICAGRELOR 60 MG PO TABS: 60.0000 mg | ORAL_TABLET | Freq: Two times a day (BID) | ORAL | 3 refills | Status: DC

## 2022-04-29 ENCOUNTER — Encounter

## 2022-05-24 ENCOUNTER — Inpatient Hospital Stay: Admit: 2022-05-24 | Payer: MEDICARE | Attending: Gastroenterology | Primary: Internal Medicine

## 2022-05-24 ENCOUNTER — Encounter

## 2022-05-24 DIAGNOSIS — K862 Cyst of pancreas: Secondary | ICD-10-CM

## 2022-05-24 MED ORDER — GADOTERIDOL 279.3 MG/ML IV SOLN
279.3 MG/ML | Freq: Once | INTRAVENOUS | Status: AC | PRN
Start: 2022-05-24 — End: 2022-05-24
  Administered 2022-05-24: 16:00:00 13 mL via INTRAVENOUS

## 2022-08-08 ENCOUNTER — Other Ambulatory Visit: Payer: Self-pay | Admitting: Cardiology

## 2022-08-10 ENCOUNTER — Other Ambulatory Visit: Payer: Self-pay | Admitting: Cardiology

## 2022-11-18 ENCOUNTER — Other Ambulatory Visit: Payer: Self-pay | Admitting: Cardiology

## 2022-12-11 ENCOUNTER — Telehealth: Payer: Self-pay | Admitting: Physician Assistant

## 2022-12-11 NOTE — Telephone Encounter (Signed)
Pt c/o medication issue:  1. Name of Medication:   benazepril (LOTENSIN) 40 MG tablet    2. How are you currently taking this medication (dosage and times per day)?  Take 1 tablet (40 mg total) by mouth daily.       3. Are you having a reaction (difficulty breathing--STAT)? No  4. What is your medication issue? Pt is requesting a callback regarding having this medication refilled but she stated a new prescription has to be sent in for 20mg  instead of 40mg  and it should go to [facility]. Please advise

## 2022-12-11 NOTE — Telephone Encounter (Signed)
Called pt back. She states she needs clarification on the Lotensin. She has received the 40mg  tablets but states the provider put her on 20mg . She is wanting the correct mg (which she says is 20) ordered and sent to [facility]. Please advise.

## 2022-12-12 ENCOUNTER — Other Ambulatory Visit: Payer: Self-pay | Admitting: *Deleted

## 2022-12-12 NOTE — Telephone Encounter (Signed)
Left message for pt to call, looking at the last office note from 10/2021, she was prescribed lotensin 40 mg per the note. She is also overdue for follow up appointment.

## 2022-12-12 NOTE — Telephone Encounter (Signed)
Pt.called stating that she was put on benazepril 20 mg but was cutting her previous 40 mg tablets in half, but she running out and need a prescription for the 20 mg tablets

## 2022-12-16 MED ORDER — BENAZEPRIL HCL 20 MG PO TABS: 20.0000 mg | ORAL_TABLET | Freq: Every day | ORAL | 0 refills | Status: DC

## 2022-12-16 MED ORDER — BENAZEPRIL HCL 40 MG PO TABS: 20.0000 mg | ORAL_TABLET | Freq: Every day | ORAL | 3 refills | Status: DC

## 2022-12-16 NOTE — Telephone Encounter (Signed)
Rn spoke to patient . She states she has been taking 20 mg Benazepril  Since her visit  last office visit.  RN reviewed with Dr Herbie Baltimore prior to call .  Per Dr Herbie Baltimore , refill prescription patient had been taking previously.    Patient aware 20 mg dose will be e-sent to [facility],     40 mg tablet has been  discontinue at [facility] - Rn spoke to pharmacy.   Patient aware appointment is needed. Appointment schedule for Feb 04, 2023 at 8 am.

## 2022-12-16 NOTE — Addendum Note (Signed)
Addended by: Freddi Starr on: 12/16/2022 12:10 PM   Modules accepted: Orders

## 2022-12-16 NOTE — Addendum Note (Signed)
Addended by: Tobin Chad on: 12/16/2022 05:33 PM   Modules accepted: Orders

## 2022-12-16 NOTE — Telephone Encounter (Signed)
Refill sent to the pharmacy electronically.  

## 2023-02-04 ENCOUNTER — Ambulatory Visit: Payer: Medicare Other | Admitting: Cardiology

## 2023-02-16 ENCOUNTER — Other Ambulatory Visit: Payer: Self-pay | Admitting: Cardiology

## 2023-03-15 NOTE — Progress Notes (Unsigned)
Cardiology Office Note    Date:  03/17/2023  ID:  Brooke, Proctor 12-06-1950, MRN [id number] PCP:  Leola Brazil, DO  Cardiologist:  Bryan Lemma, MD  Electrophysiologist:  None   Chief Complaint: Follow up for CAD, HTN   History of Present Illness: .    Brooke Proctor is a 72 y.o. female with visit-pertinent history of CAD (NSTEMI in 2016 with PCI to LAD and RCA), hypertension, hyperlipidemia, LVH with septal hypertrophy and obesity.   In 2016 she had an NSTEMI, cardiac catheterization at the time revealed a 20% proximal to mid LAD, 50% OM1, 50% OM 2, 90% mid LAD lesion treated with DES, 80% mid RCA lesion treated with DES complicated by transient no flow phenomena.  Myoview in September 2018 showed no ischemia and or infarction.  TTE obtained in February 2020 showed EF 60 to 65%, septal LVH, mild SAM LVOT obstruction at rest.  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  Per notes Dr. Herbie Baltimore has reviewed the previous echocardiogram who felt the patient likely has hypertension and hypertrophic cardiomyopathy rather than HOCM.  Last CTA obtained in October 2021 showed dilated ascending aorta measuring 40 mm.  It was recommended by radiology to have annual imaging for follow-up, Dr. Herbie Baltimore was planning to repeat imaging every 2 years unless significant change.  [name] was last seen in office on 10/29/2021.  She denied any recent chest pain, she did however note some fatigue the past 6 months.  Her blood pressure had remained elevated and she was started on benazepril 40 mg daily.  Today she presents for follow up. She reports that she is doing well, she has been having some ongoing fatigue, she notes that she has difficulty staying asleep at night and has been told by her children that she snores.  She also endorses some dyspnea  on exertion, specifically when she climbs two flights of stairs, she denies chest pain with exertion.  She has also noticed an increase in palpitations in the last 6 months.  She notes she will have an 3-second episode of fast heart rate or skipped beat every couple of days.  She denies any associated symptoms with this.  She reports that she has been trying to get back into the gym to increase her exercise as she feels that she has had some deconditioning.  Labwork independently reviewed: 07/07/2022: Creatinine 0.88, sodium 143, potassium 4.0 AST 20, ALT 15  ROS: .   Today she denies chest pain, lower extremity edema, melena, hematuria, hemoptysis, diaphoresis, weakness, presyncope, syncope, orthopnea, and PND.  All other systems are reviewed and otherwise negative. Studies Reviewed: Marland Kitchen    EKG:  EKG is ordered today, personally reviewed, demonstrating  EKG Interpretation Date/Time:  Tuesday March 17 2023 11:33:23 EST Ventricular Rate:  70 PR Interval:  166 QRS Duration:  126 QT Interval:  438 QTC Calculation: 473 R Axis:   -64  Text Interpretation: Sinus rhythm with Premature supraventricular complexes Right bundle branch block Left anterior fascicular block Bifascicular block Minimal voltage criteria for LVH, may be normal variant ( R in aVL ) Confirmed by Reather Littler [phone]) on 03/17/2023 12:27:05 PM   CV Studies:  Cardiac Studies & Procedures   CARDIAC CATHETERIZATION  CARDIAC CATHETERIZATION 09/20/2014  Narrative  Prox LAD to Mid LAD lesion, 20% stenosed.  1st Mrg lesion,  30% stenosed.  2nd Mrg lesion, 30% stenosed.  Mid LAD lesion, 90% stenosed. There is a 0% residual stenosis post intervention.  A drug-eluting stent was placed.  Mid RCA to Dist RCA lesion, 80% stenosed. There is a 0% residual stenosis post intervention.  A drug-eluting stent was placed.  1. 2 vessel obstructive CAD 2. Normal LV function 3. Successful stenting of the mid LAD with DES 4. Successful  stenting of the mid RCA with DES. Transient no reflow phenomenon.  Recommendations: continue DAPT for one year. Risk factor modification.  Findings Coronary Findings Diagnostic  Dominance: Right  Left Anterior Descending diffuse . discrete .  Left Circumflex  First Obtuse Marginal Branch discrete .  Second Obtuse Marginal Branch discrete .  Right Coronary Artery tubular, eccentric .  Intervention  Mid LAD lesion PCI The pre-interventional distal flow is normal (TIMI 3). Pre-stent angioplasty was performed. 2.25 mm balloon A drug-eluting stent was placed. 2.5 x 16 mm Promus Post-stent angioplasty was performed. 2.5 [place] balloon Maximum pressure: 16 atm. The post-interventional distal flow is normal (TIMI 3). The intervention was successful. No complications occurred at this lesion. There is a 0% residual stenosis post intervention.  Mid RCA to Dist RCA lesion PCI The pre-interventional distal flow is normal (TIMI 3). Pre-stent angioplasty was performed. 2.5 mm balloon A drug-eluting stent was placed. 4.0 x 16 mm Promus Post-stent angioplasty was performed. 4.5 mm [place] balloon Maximum pressure: 16 atm. The post-interventional distal flow is normal (TIMI 3). The intervention was successful. The patient experienced the no reflow phenomenon in this vessel. Mild no reflow associated with chest pain and hypotension. Improved with IC Ntg and IV hydration. There is a 0% residual stenosis post intervention.   STRESS TESTS  MYOCARDIAL PERFUSION IMAGING 01/08/2017  Narrative  Normal perfusion No ischemia or scar  Nuclear stress EF: 67%.  The study is normal.  This is a low risk study.   ECHOCARDIOGRAM  ECHOCARDIOGRAM LIMITED 01/25/2019  Narrative ECHOCARDIOGRAM LIMITED REPORT    Patient Name:   Brooke Proctor Date of Exam: 01/25/2019 Medical Rec #:  [id number]         Height:       66.0 in Accession #:    [id number]        Weight:       210.6 lb Date of Birth:  Feb 26, 1951          BSA:          2.04 m Patient Age:    67 years          BP:           140/78 mmHg Patient Gender: F                 HR:           68 bpm. 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 Previous Myocardial Infarction and CAD; Arrythmias:RBBB Signs/Symptoms:Shortness of Breath Risk Factors:Hypertension and Dyslipidemia. Breast Cancer with Chemo and Radiation, NSTEMI, Obesity.  Sonographer:    Farrel Conners RDCS Referring Phys: [phone] HAO MENG  IMPRESSIONS   1. Left ventricular ejection fraction, by visual estimation, is 70 to 75%. The left ventricle has normal function. Normal left ventricular size. Left ventricular septal wall thickness was severely increased. There is mildly increased  left ventricular hypertrophy. 2. Left ventricular diastolic Doppler parameters are consistent with impaired relaxation pattern of LV diastolic filling. 3. Global right ventricle has normal systolic function.The right ventricular size is normal. No increase in right ventricular wall thickness. 4. Left atrial size was normal. 5. Right atrial size was normal. 6. The mitral valve is normal in structure. Mild mitral valve regurgitation. No evidence of mitral stenosis. 7. The tricuspid valve is normal in structure. Tricuspid valve regurgitation was not visualized by color flow Doppler. 8. The aortic valve is normal in structure. Aortic valve regurgitation was not visualized by color flow Doppler. Structurally normal aortic valve, with no evidence of sclerosis or stenosis. 9. The pulmonic valve was normal in structure. Pulmonic valve regurgitation is not visualized by color flow Doppler. 10. Aneurysm of the ascending aorta, measuring 43 mm. 11. Mildly elevated pulmonary artery systolic pressure. 12. The tricuspid regurgitant  velocity is 2.65 m/s, and with an assumed right atrial pressure of 3 mmHg, the estimated right ventricular systolic pressure is mildly elevated at 31.1 mmHg. 13. The inferior vena cava is normal in size with greater than 50% respiratory variability, suggesting right atrial pressure of 3 mmHg.  FINDINGS Left Ventricle: Left ventricular ejection fraction, by visual estimation, is 70 to 75%. The left ventricle has normal function. Left ventricular septal wall thickness was severely increased. There is mildly increased left ventricular hypertrophy. Eccentric left ventricular hypertrophy. Normal left ventricular size. Spectral Doppler shows Left ventricular diastolic Doppler parameters are consistent with impaired relaxation pattern of LV diastolic filling.  Right Ventricle: The right ventricular size is normal. No increase in right ventricular wall thickness. Global RV systolic function is has normal systolic function. The tricuspid regurgitant velocity is 2.65 m/s, and with an assumed right atrial pressure of 3 mmHg, the estimated right ventricular systolic pressure is mildly elevated at 31.1 mmHg.  Left Atrium: Left atrial size was normal in size.  Right Atrium: Right atrial size was normal in size  Pericardium: There is no evidence of pericardial effusion.  Mitral Valve: The mitral valve is normal in structure. No evidence of mitral valve stenosis by observation. MV Area by PHT, 2.71 cm. MV PHT, 81.20 msec. Mild mitral valve regurgitation.  Tricuspid Valve: The tricuspid valve is normal in structure. Tricuspid valve regurgitation was not visualized by color flow Doppler.  Aortic Valve: The aortic valve is normal in structure. Aortic valve regurgitation was not visualized by color flow Doppler. The aortic valve is structurally normal, with no evidence of sclerosis or stenosis.  Pulmonic Valve: The pulmonic valve was normal in structure. Pulmonic valve regurgitation is not visualized by color  flow Doppler.  Aorta: The aortic root, ascending aorta and aortic arch are all structurally normal, with no evidence of dilitation or obstruction. There is an aneurysm involving the ascending aorta. The aneurysm measures 43 mm.  Venous: The inferior vena cava is normal in size with greater than 50% respiratory variability, suggesting right atrial pressure of 3 mmHg.  Shunts: No ventricular septal defect is seen or detected. There is no evidence of an atrial septal defect. No atrial level shunt detected by color flow Doppler.   LEFT VENTRICLE          Normals PLAX 2D LVIDd:         4.10 cm  3.6 cm LVIDs:         1.81 cm  1.7 cm LV PW:         1.22 cm  1.4 cm LV IVS:  1.13 cm  1.3 cm LVOT diam:     2.20 cm  2.0 cm LV SV:         64 ml    79 ml LV SV Index:   29.99    45 ml/m2 LVOT Area:     3.80 cm 3.14 cm2   RIGHT VENTRICLE TAPSE (M-mode): 2.1 cm  LEFT ATRIUM         Index LA diam:    3.60 cm 1.76 cm/m AORTIC VALVE             Normals LVOT Vmax:   128.00 cm/s LVOT Vmean:  86.300 cm/s 75 cm/s LVOT VTI:    0.324 m     25.3 cm  AORTA                 Normals Ao Root diam: 3.50 cm 31 mm Ao Asc diam:  4.30 cm 31 mm  MITRAL VALVE            Normals     TRICUSPID VALVE             Normals MV Area (PHT): cm                  TR Peak grad:   28.1 mmHg MV PHT:        msec     55 ms       TR Vmax:        265.00 cm/s 288 cm/s MV Decel Time: 280 msec 187 ms MR Peak grad: 127.2 mmHg            SHUNTS MR Mean grad: 74.0 mmHg             Systemic VTI:  0.32 m MR Vmax:      564.00 cm/s           Systemic Diam: 2.20 cm MR Vmean:     393.0 cm/s MV E velocity: 94.10 cm/s 103 cm/s MV A velocity: 93.10 cm/s 70.3 cm/s MV E/A ratio:  1.01       1.5   Tobias Alexander MD Electronically signed by Tobias Alexander MD Signature Date/Time: 01/25/2019/4:07:15 PM    Final    MONITORS  CARDIAC EVENT MONITOR 10/23/2015  Narrative Rhythm was sinus and sinus tach. No worrisome arrhythmias  were noted.  Chrystie Nose, MD, [facility] Attending Cardiologist [facility]            Current Reported Medications:.    Current Meds  Medication Sig   Cholecalciferol (VITAMIN D3 SUPER STRENGTH) 50 MCG (2000 UT) TABS Take 2 tablets by mouth daily at 12 noon.   IBUPROFEN PO Take 200 mg by mouth as needed.   levothyroxine (SYNTHROID, LEVOTHROID) 100 MCG tablet Take 100 mcg by mouth daily before breakfast.   [DISCONTINUED] atorvastatin (LIPITOR) 40 MG tablet TAKE 1 TABLET BY MOUTH AT  BEDTIME   [DISCONTINUED] benazepril (LOTENSIN) 20 MG tablet TAKE 1 TABLET BY MOUTH DAILY   [DISCONTINUED] BRILINTA 60 MG TABS tablet TAKE 1 TABLET BY MOUTH TWICE  DAILY   [DISCONTINUED] metoprolol succinate (TOPROL-XL) 25 MG 24 hr tablet TAKE 3 TABLETS BY MOUTH DAILY   [DISCONTINUED] nitroGLYCERIN (NITROSTAT) 0.4 MG SL tablet DISSOLVE 1 TABLET UNDER THE TONGUE EVERY 5 MINUTES AS  NEEDED FOR CHEST PAIN. MAX  OF 3 TABLETS IN 15 MINUTES. CALL 911 IF PAIN PERSISTS.   [DISCONTINUED] potassium chloride SA (KLOR-CON M) 20 MEQ tablet TAKE 1 TABLET BY MOUTH DAILY  Physical Exam:    VS:  BP (!) 142/80   Pulse 69   Ht 5\' 6"  (1.676 m)   Wt 207 lb 12.8 oz (94.3 kg)   SpO2 99%   BMI 33.54 kg/m    Wt Readings from Last 3 Encounters:  03/17/23 207 lb 12.8 oz (94.3 kg)  10/29/21 213 lb 9.6 oz (96.9 kg)  07/02/20 209 lb (94.8 kg)    GEN: Well nourished, well developed in no acute distress NECK: No JVD; No carotid bruits CARDIAC: RRR, no murmurs, rubs, gallops RESPIRATORY:  Clear to auscultation without rales, wheezing or rhonchi  ABDOMEN: Soft, non-tender, non-distended EXTREMITIES:  No edema; No acute deformity   Asessement and Plan:.    CAD/shortness of breath: s/p NSTEMI in 2016, cardiac catheterization at the time revealed a 20% proximal to mid LAD, 50% OM1, 50% OM 2, 90% mid LAD lesion treated with DES, 80% mid RCA lesion treated with DES complicated by transient no flow phenomena.  Myoview in  September 2018 showed no ischemia and or infarction.  Patient notes some increased shortness of breath in the last few months, specifically when climbing 2 flights of stairs.  She denies any chest pain, lower extremity edema, orthopnea or PND.  She questions if this is possibly related to deconditioning.  Will check echocardiogram and have her wear a 2-week ZIO for palpitations, see below.  If workup is unrevealing can consider ischemic evaluation on follow-up.  Reviewed ED precautions.  Continue Lipitor 40 mg daily, metoprolol succinate 75 mg daily, Brilinta 60 mg twice daily.  Increase benazepril to 40 mg daily, see below.  Palpitations: Today patient reports increased episodes of palpitations in the last 6 months. She will have an 3-second episode of fast heart rate or skipped beat every couple of days, she denies associated symptoms, she denies presyncope or syncope. 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 Patient reports compliance with her metoprolol, taken last night.  Encourage patient to stay well-hydrated and reduce caffeine intake.  Will have her wear a 2-week nonlive ZIO for further evaluation. Check CBC, BMET, Mag and TSH. Will also have her complete Itamar, see below.   Hypertension: Initial blood pressure today 158/82, on recheck was 142/80.  Through shared decision making will increase her benazepril back to 40 mg daily.  Encouraged monitoring of blood pressures at home. Will recheck BMET in two weeks.   Hyperlipidemia: Last lipid profile on 07/07/2022 indicated total cholesterol 130, triglycerides 98, HDL 42 and LDL 70.  On atorvastatin 40 mg daily.  Thoracic aortic aneurysm: CTA of the chest in October 2021 demonstrated 40 mm ascending thoracic aortic aneurysm.  Will repeat CT angio chest/aorta for monitoring.  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  Per notes Dr. Herbie Baltimore has reviewed the previous echocardiogram who felt the patient likely has hypertension and hypertrophic cardiomyopathy rather than HOCM. Today she denies any presyncope or syncope. She does endorse some worsened shortness of breath. Encouraged to stay well hydrated and to continue monitoring her blood pressure at home. Will check echocardiogram as noted above.   Sleep disordered breathing: Patient reports increased fatigue during the day, notes that she has difficulty  staying asleep at night.  Her children have told her that she does snore. STOP BANG of 4.  Will evaluate for OSA with Itamar, patient is agreeable.   Disposition: F/u with Reather Littler, NP in six weeks, follow up with Dr. Herbie Baltimore in four months.   Signed, Rip Harbour, NP

## 2023-03-17 ENCOUNTER — Ambulatory Visit: Payer: Medicare Other

## 2023-03-17 ENCOUNTER — Encounter: Payer: Self-pay | Admitting: Cardiology

## 2023-03-17 ENCOUNTER — Ambulatory Visit: Payer: Medicare Other | Attending: Cardiology | Admitting: Cardiology

## 2023-03-17 ENCOUNTER — Telehealth: Payer: Self-pay

## 2023-03-17 VITALS — BP 142/80 | HR 69 | Ht 66.0 in | Wt 207.8 lb

## 2023-03-17 DIAGNOSIS — I2510 Atherosclerotic heart disease of native coronary artery without angina pectoris: Secondary | ICD-10-CM

## 2023-03-17 DIAGNOSIS — I77810 Thoracic aortic ectasia: Secondary | ICD-10-CM

## 2023-03-17 DIAGNOSIS — R002 Palpitations: Secondary | ICD-10-CM

## 2023-03-17 DIAGNOSIS — R0602 Shortness of breath: Secondary | ICD-10-CM | POA: Diagnosis not present

## 2023-03-17 DIAGNOSIS — G4730 Sleep apnea, unspecified: Secondary | ICD-10-CM

## 2023-03-17 DIAGNOSIS — I422 Other hypertrophic cardiomyopathy: Secondary | ICD-10-CM

## 2023-03-17 DIAGNOSIS — E785 Hyperlipidemia, unspecified: Secondary | ICD-10-CM

## 2023-03-17 DIAGNOSIS — Z9861 Coronary angioplasty status: Secondary | ICD-10-CM | POA: Diagnosis not present

## 2023-03-17 DIAGNOSIS — I10 Essential (primary) hypertension: Secondary | ICD-10-CM | POA: Diagnosis not present

## 2023-03-17 DIAGNOSIS — I7121 Aneurysm of the ascending aorta, without rupture: Secondary | ICD-10-CM

## 2023-03-17 DIAGNOSIS — Z79899 Other long term (current) drug therapy: Secondary | ICD-10-CM

## 2023-03-17 MED ORDER — POTASSIUM CHLORIDE CRYS ER 20 MEQ PO TBCR: EXTENDED_RELEASE_TABLET | ORAL | 3 refills | Status: DC

## 2023-03-17 MED ORDER — BENAZEPRIL HCL 40 MG PO TABS: 40.0000 mg | ORAL_TABLET | Freq: Every day | ORAL | 3 refills | Status: DC

## 2023-03-17 MED ORDER — METOPROLOL SUCCINATE ER 50 MG PO TB24: 75.0000 mg | ORAL_TABLET | Freq: Every day | ORAL | 3 refills | Status: DC

## 2023-03-17 MED ORDER — ATORVASTATIN CALCIUM 40 MG PO TABS: ORAL_TABLET | ORAL | 3 refills | Status: DC

## 2023-03-17 MED ORDER — NITROGLYCERIN 0.4 MG SL SUBL: SUBLINGUAL_TABLET | SUBLINGUAL | 3 refills | Status: AC

## 2023-03-17 MED ORDER — TICAGRELOR 60 MG PO TABS: 60.0000 mg | ORAL_TABLET | Freq: Two times a day (BID) | ORAL | 3 refills | Status: DC

## 2023-03-17 NOTE — Telephone Encounter (Signed)
Patient agreement reviewed and signed on 03/17/2023.  WatchPAT issued to patient on 03/17/2023 by Ashley Mariner, LPN. Patient aware to not open the WatchPAT box until contacted with the activation PIN. Patient profile initialized in CloudPAT on 03/17/2023 by Ashley Mariner, LPN. Device serial number: [id number]

## 2023-03-17 NOTE — Progress Notes (Unsigned)
Enrolled for [employer] to mail a ZIO XT long term holter monitor to the patients address on file.   Dr. Ellyn Hack to read.

## 2023-03-17 NOTE — Patient Instructions (Signed)
Medication Instructions:  Increase Benazepril to 40 mg daily *If you need a refill on your cardiac medications before your next appointment, please call your pharmacy*   Lab Work: CBC, BMP, Mag, TSH- TODAY  BMP- Please return for Blood Work in 2 WEEKS. No appointment needed, lab here at the office is open Monday-Friday from 8AM to 4PM and closed daily for lunch from 12:45-1:45.   If you have labs (blood work) drawn today and your tests are completely normal, you will receive your results only by: MyChart Message (if you have MyChart) OR A paper copy in the mail If you have any lab test that is abnormal or we need to change your treatment, we will call you to review the results.   Testing/Procedures: Your physician has requested that you have an echocardiogram. Echocardiography is a painless test that uses sound waves to create images of your heart. It provides your doctor with information about the size and shape of your heart and how well your heart's chambers and valves are working. This procedure takes approximately one hour. There are no restrictions for this procedure. Please do NOT wear cologne, perfume, aftershave, or lotions (deodorant is allowed). Please arrive 15 minutes prior to your appointment time.  Please note: We ask at that you not bring children with you during ultrasound (echo/ vascular) testing. Due to room size and safety concerns, children are not allowed in the ultrasound rooms during exams. Our front office staff cannot provide observation of children in our lobby area while testing is being conducted. An adult accompanying a patient to their appointment will only be allowed in the ultrasound room at the discretion of the ultrasound technician under special circumstances. We apologize for any inconvenience.   CT-scan of the chest- This will be performed at [facility] on [address] [place], [place]. They will call to set up the appointment.  Your  physician has recommended that you wear a 14 DAY ZIO-PATCH monitor. The Zio patch cardiac monitor continuously records heart rhythm data for up to 14 days, this is for patients being evaluated for multiple types heart rhythms. For the first 24 hours post application, please avoid getting the Zio monitor wet in the shower or by excessive sweating during exercise. After that, feel free to carry on with regular activities. Keep soaps and lotions away from the ZIO XT Patch.  This will be mailed to you, please expect 7-10 days to receive.    Applying the monitor   Shave hair from upper left chest.   Hold abrader disc by orange tab.  Rub abrader in 40 strokes over left upper chest as indicated in your monitor instructions.   Clean area with 4 enclosed alcohol pads .  Use all pads to assure are is cleaned thoroughly.  Let dry.   Apply patch as indicated in monitor instructions.  Patch will be place under collarbone on left side of chest with arrow pointing upward.   Rub patch adhesive wings for 2 minutes.Remove white label marked "1".  Remove white label marked "2".  Rub patch adhesive wings for 2 additional minutes.   While looking in a mirror, press and release button in center of patch.  A small green light will flash 3-4 times .  This will be your only indicator the monitor has been turned on.     Do not shower for the first 24 hours.  You may shower after the first 24 hours.   Press button if you feel  a symptom. You will hear a small click.  Record Date, Time and Symptom in the Patient Log Book.   When you are ready to remove patch, follow instructions on last 2 pages of Patient Log Book.  Stick patch monitor onto last page of Patient Log Book.   Place Patient Log Book in [place] box.  Use locking tab on box and tape box closed securely.  The [employer] and [employer] has [employer] on it.  Please place in mailbox as soon as possible.  Your physician should have your test results approximately 7  days after the monitor has been mailed back to [facility].   Call [facility] Customer Care at [phone] if you have questions regarding your ZIO XT patch monitor.  Call them immediately if you see an orange light blinking on your monitor.   If your monitor falls off in less than 4 days contact our Monitor department at [phone].  If your monitor becomes loose or falls off after 4 days call [employer] at [phone] for suggestions on securing your monitor    WatchPAT?  Is a FDA cleared portable home sleep study test that uses a watch and 3 points of contact to monitor 7 different channels, including your heart rate, oxygen saturations, body position, snoring, and chest motion.  The study is easy to use from the comfort of your own home and accurately detect sleep apnea.  Before bed, you attach the chest sensor, attached the sleep apnea bracelet to your nondominant hand, and attach the finger probe.  After the study, the raw data is downloaded from the watch and scored for apnea events.   For more information: [email or web address]  Patient Testing Instructions:  Do not put battery into the device until bedtime when you are ready to begin the test. Please call the support number if you need assistance after following the instructions below: 24 hour support line- [phone] or [facility] support at [phone] (option 2)  Download the IntelWatchPAT One" app through the google play store or App Store  Be sure to turn on or enable access to bluetooth in settlings on your smartphone/ device  Make sure no other bluetooth devices are on and within the vicinity of your smartphone/ device and WatchPAT watch during testing.  Make sure to leave your smart phone/ device plugged in and charging all night.  When ready for bed:  Follow the instructions step by step in the WatchPAT One App to activate the testing device. For additional instructions, including video  instruction, visit the WatchPAT One video on Youtube. You can search for WatchPat One within Youtube (video is 4 minutes and 18 seconds) or enter: https://youtube/watch?v=BCce_vbiwxE Please note: You will be prompted to enter a Pin to connect via bluetooth when starting the test. The PIN will be assigned to you when you receive the test.  The device is disposable, but it recommended that you retain the device until you receive a call letting you know the study has been received and the results have been interpreted.  We will let you know if the study did not transmit to [place] properly after the test is completed. You do not need to call us to confirm the receipt of the test.  Please complete the test within 48 hours of receiving PIN.   Frequently Asked Questions:  What is Watch Dennie Bible one?  A single use fully disposable home sleep apnea testing device and will not need to be returned after completion.  What are the  requirements to use WatchPAT one?  The be able to have a successful watchpat one sleep study, you should have your Watch pat one device, your smart phone, watch pat one app, your PIN number and Internet access What type of phone do I need?  You should have a smart phone that uses Android 5.1 and above or any Iphone with IOS 10 and above How can I download the WatchPAT one app?  Based on your device type search for WatchPAT one app either in google play for android devices or APP store for Iphone's Where will I get my PIN for the study?  Your PIN will be provided by your physician's office. It is used for authentication and if you lose/forget your PIN, please reach out to your providers office.  I do not have Internet at home. Can I do WatchPAT one study?  WatchPAT One needs Internet connection throughout the night to be able to transmit the sleep data. You can use your home/local internet or your cellular's data package. However, it is always recommended to use home/local Internet. It is  estimated that between 20MB-30MB will be used with each study.However, the application will be looking for space in the phone to start the study.  What happens if I lose internet or bluetooth connection?  During the internet disconnection, your phone will not be able to transmit the sleep data. All the data, will be stored in your phone. As soon as the internet connection is back on, the phone will being sending the sleep data. During the bluetooth disconnection, WatchPAT one will not be able to to send the sleep data to your phone. Data will be kept in the [facility] one until two devices have bluetooth connection back on. As soon as the connection is back on, WatchPAT one will send the sleep data to the phone.  How long do I need to wear the WatchPAT one?  After you start the study, you should wear the device at least 6 hours.  How far should I keep my phone from the device?  During the night, your phone should be within 15 feet.  What happens if I leave the room for restroom or other reasons?  Leaving the room for any reason will not cause any problem. As soon as your get back to the room, both devices will reconnect and will continue to send the sleep data. Can I use my phone during the sleep study?  Yes, you can use your phone as usual during the study. But it is recommended to put your watchpat one on when you are ready to go to bed.  How will I get my study results?  A soon as you completed your study, your sleep data will be sent to the provider. They will then share the results with you when they are ready.     Follow-Up: At [facility], you and your health needs are our priority.  As part of our continuing mission to provide you with exceptional heart care, we have created designated Provider Care Teams.  These Care Teams include your primary Cardiologist (physician) and Advanced Practice Providers (APPs -  Physician Assistants and Nurse Practitioners) who all work together to  provide you with the care you need, when you need it.  We recommend signing up for the patient portal called "MyChart".  Sign up information is provided on this After Visit Summary.  MyChart is used to connect with patients for Virtual Visits (Telemedicine).  Patients  are able to view lab/test results, encounter notes, upcoming appointments, etc.  Non-urgent messages can be sent to your provider as well.   To learn more about what you can do with MyChart, go to [email or web address].    Your next appointment:   6 weeks with Reather Littler, NP  4 months with Dr Herbie Baltimore

## 2023-03-18 LAB — BASIC METABOLIC PANEL
BUN/Creatinine Ratio: 14 (ref 12–28)
BUN: 12 mg/dL (ref 8–27)
CO2: 23 mmol/L (ref 20–29)
Calcium: 9.7 mg/dL (ref 8.7–10.3)
Chloride: 107 mmol/L — ABNORMAL HIGH (ref 96–106)
Creatinine, Ser: 0.86 mg/dL (ref 0.57–1.00)
Glucose: 85 mg/dL (ref 70–99)
Potassium: 4.9 mmol/L (ref 3.5–5.2)
Sodium: 143 mmol/L (ref 134–144)
eGFR: 72 mL/min/{1.73_m2} (ref >59)

## 2023-03-18 LAB — CBC
Hematocrit: 40.8 % (ref 34.0–46.6)
Hemoglobin: 13.1 g/dL (ref 11.1–15.9)
MCH: 28.4 pg (ref 26.6–33.0)
MCHC: 32.1 g/dL (ref 31.5–35.7)
MCV: 89 fL (ref 79–97)
Platelets: 211 10*3/uL (ref 150–450)
RBC: 4.61 x10E6/uL (ref 3.77–5.28)
RDW: 11.4 % — ABNORMAL LOW (ref 11.7–15.4)
WBC: 4 10*3/uL (ref 3.4–10.8)

## 2023-03-18 LAB — TSH: TSH: 0.7 u[IU]/mL (ref 0.450–4.500)

## 2023-03-18 LAB — MAGNESIUM: Magnesium: 2 mg/dL (ref 1.6–2.3)

## 2023-03-31 ENCOUNTER — Other Ambulatory Visit: Payer: Medicare Other

## 2023-04-14 ENCOUNTER — Other Ambulatory Visit: Payer: Medicare Other

## 2023-04-16 ENCOUNTER — Telehealth: Payer: Self-pay

## 2023-04-16 LAB — BASIC METABOLIC PANEL
BUN/Creatinine Ratio: 14 (ref 12–28)
BUN: 12 mg/dL (ref 8–27)
CO2: 25 mmol/L (ref 20–29)
Calcium: 9.7 mg/dL (ref 8.7–10.3)
Chloride: 109 mmol/L — ABNORMAL HIGH (ref 96–106)
Creatinine, Ser: 0.84 mg/dL (ref 0.57–1.00)
Glucose: 91 mg/dL (ref 70–99)
Potassium: 4.5 mmol/L (ref 3.5–5.2)
Sodium: 145 mmol/L — ABNORMAL HIGH (ref 134–144)
eGFR: 74 mL/min/{1.73_m2} (ref >59)

## 2023-04-16 NOTE — Telephone Encounter (Signed)
-----   Message from Rip Harbour sent at 04/16/2023  7:46 AM EST ----- Please let Brooke Proctor know that her kidney function and electrolytes are normal. Overall good results, continue current medications.

## 2023-04-16 NOTE — Telephone Encounter (Signed)
Left message to call back  

## 2023-04-17 NOTE — Telephone Encounter (Signed)
Patient identification verified by 2 forms. Marilynn Rail, RN    Called and spoke to patient  Relayed result message below  Patient verbalized understanding, no questions at this time

## 2023-04-17 NOTE — Telephone Encounter (Signed)
Pt returning call

## 2023-04-22 ENCOUNTER — Telehealth (HOSPITAL_COMMUNITY): Payer: Self-pay | Admitting: Cardiology

## 2023-04-22 NOTE — Telephone Encounter (Signed)
Patient called and cancelled for 04/24/23 and does not wish to reschedule at this time. Order will be removed from the active echo WQ and when patient calls back we will reinstate the order. Thank you.

## 2023-04-24 ENCOUNTER — Ambulatory Visit (HOSPITAL_COMMUNITY): Payer: Medicare Other

## 2023-10-07 ENCOUNTER — Inpatient Hospital Stay: Payer: Medicare (Managed Care) | Attending: Gastroenterology

## 2023-10-07 MED ORDER — NORMAL SALINE FLUSH 0.9 % IV SOLN
0.9 | Freq: Two times a day (BID) | INTRAVENOUS | Status: DC
Start: 2023-10-07 — End: 2023-10-07

## 2023-10-07 MED ORDER — LIDOCAINE HCL 2 % IJ SOLN (MIXTURES ONLY)
2 | Freq: Once | INTRAMUSCULAR | Status: DC | PRN
Start: 2023-10-07 — End: 2023-10-07
  Administered 2023-10-07: 13:00:00 40 via INTRAVENOUS

## 2023-10-07 MED ORDER — GLYCOPYRROLATE 0.2 MG/ML IJ SOLN
0.2 | Freq: Once | INTRAMUSCULAR | Status: DC | PRN
Start: 2023-10-07 — End: 2023-10-07
  Administered 2023-10-07: 13:00:00 .1 via INTRAVENOUS

## 2023-10-07 MED ORDER — NORMAL SALINE FLUSH 0.9 % IV SOLN
0.9 | INTRAVENOUS | Status: DC | PRN
Start: 2023-10-07 — End: 2023-10-07

## 2023-10-07 MED ORDER — LIDOCAINE HCL (PF) 2 % IJ SOLN
2 | INTRAMUSCULAR | Status: AC
Start: 2023-10-07 — End: ?

## 2023-10-07 MED ORDER — SODIUM CHLORIDE 0.9 % IV SOLN
0.9 | INTRAVENOUS | Status: DC | PRN
Start: 2023-10-07 — End: 2023-10-07
  Administered 2023-10-07: 12:00:00 via INTRAVENOUS

## 2023-10-07 MED ORDER — GLYCOPYRROLATE 0.2 MG/ML IJ SOLN
0.2 | INTRAMUSCULAR | Status: AC
Start: 2023-10-07 — End: ?

## 2023-10-07 MED ORDER — PROPOFOL 100 MG/10ML IV EMUL
100 | Freq: Once | INTRAVENOUS | Status: DC | PRN
Start: 2023-10-07 — End: 2023-10-07
  Administered 2023-10-07 (×2): 50 via INTRAVENOUS

## 2023-10-07 MED ORDER — PROPOFOL 200 MG/20ML IV EMUL
200 | INTRAVENOUS | Status: AC
Start: 2023-10-07 — End: ?

## 2023-10-07 MED ORDER — LACTATED RINGERS IV SOLN
INTRAVENOUS | Status: DC | PRN
Start: 2023-10-07 — End: 2023-10-07
  Administered 2023-10-07: 12:00:00 via INTRAVENOUS

## 2023-10-07 MED FILL — XYLOCAINE-MPF 2 % IJ SOLN: 2 % | INTRAMUSCULAR | Qty: 5 | Fill #0

## 2023-10-07 MED FILL — PROPOFOL 200 MG/20ML IV EMUL: 200 MG/20ML | INTRAVENOUS | Qty: 20 | Fill #0

## 2023-10-07 MED FILL — GLYCOPYRROLATE 0.2 MG/ML IJ SOLN: 0.2 mg/mL | INTRAMUSCULAR | Qty: 1 | Fill #0

## 2023-10-07 NOTE — Progress Notes (Signed)
 Endoscopy recovery  Patient returned to baseline, vital signs stable (see vital sign flowsheet). Patient offered liquids and tolerated well. Respiratory status within defined limits. Abdomen soft not tender. Skin with in defined limits. Responsible party driving patient home was given the opportunity to ask questions. Patient discharged with documented belongings.

## 2023-10-07 NOTE — Progress Notes (Signed)
 ARRIVAL INFORMATION:  Verified patient name and date of birth, scheduled procedure, and informed consent.     Driver: Ethelle Herb husband oktt   contact number: [phone]  Physician and staff can share information with the driver.     Receive texts: yes    Belongings with patient include:  Clothing, glasses 2 rings left on patient , purse left with driver     GI FOCUSED ASSESSMENT:  Neuro: Awake, alert, oriented x4  Respiratory: even and unlabored   GI: soft and non-distended  EKG Rhythm: normal sinus rhythm    The risks and benefits of the bite block have been explained to patient.  Patient verbalizes understanding.      Education:Reviewed general discharge instructions and driver information.

## 2023-10-07 NOTE — Op Note (Signed)
 [facility]                   Endoscopic Gastroduodenoscopy Procedure Note      10/07/2023  Stephanie Randolph  Jun 23, 1950  [id number]    Procedure: Endoscopic Gastroduodenoscopy with biopsy    Indication:  Follow up bleeding ulcers      Pre-operative Diagnosis: see indication above    Post-operative Diagnosis: see findings below    Operator: P. April Bayard, Marieta Shorten. MD    Referring Provider:  Cherly Corners, MD      Anesthesia/Sedation:  MAC anesthesia Propofol    Airway assessment: No airway problems anticipated    Pre-Procedural Exam:      Airway: clear, no airway problems anticipated  Heart: RRR, without gallops or rubs  Lungs: clear bilaterally without wheezes, crackles, or rhonchi  Abdomen: soft, nontender, nondistended, bowel sounds present  Mental Status: awake, alert and oriented to person, place and time       Procedure Details     After infomed consent was obtained for the procedure, with all risks and benefits of procedure explained the patient was taken to the endoscopy suite and placed in the left lateral decubitus position.  Following sequential administration of sedation as per above, the endoscope was inserted into the mouth and advanced under direct vision to second portion of the duodenum.  A careful inspection was made as the gastroscope was withdrawn, including a retroflexed view of the proximal stomach; findings and interventions are described below.      Findings:   Esophagus:normal mucosa  Stomach: Normal mucosa. Biopsied the antrum for path  Duodenum: normal    Therapies:  biopsy of stomach antrum    Specimens:  Biopsies of gastric antrum           Complications:   None; patient tolerated the procedure well.    EBL:  None.            Impression:      -Normal EGD.  Gastric and duodenal ulcers all healed    Recommendations:  -Can stop acid suppression., -Await pathology.    Laurinda Porch, MD6/08/2023

## 2023-10-07 NOTE — Progress Notes (Signed)
 Endoscope was pre-cleaned at bedside immediately following procedure by Malvin Johns     Glasses transported to recovery with patient .

## 2023-10-07 NOTE — Discharge Instructions (Addendum)
 Baldo Bonds, MD  [facility]  [address], [address]  [place], [place] [address]  [phone]  [email or web address]    Stephanie Randolph  [id number]  October 16, 1950    EGD DISCHARGE INSTRUCTIONS    CALL M.D.  ANY SIGN OF   Increasing pain, nausea, vomiting  Abdominal distension (swelling)  New increased bleeding (oral or rectal)  Fever (chills)  Pain in chest area  Bloody discharge from nose or mouth  Shortness of breath    For 24 hours after general anesthesia or intravenous analgesia / sedation  you may experience:  Drowsiness, dizziness, sleepiness, or confusion  Difficulty remembering or delayed reaction times  Difficulty with your balance, especially while walking, move slowly and carefully, do not make sudden position changes  Difficulty focusing or blurred vision    Discomfort:  Sore throat- throat lozenges or warm salt water gargle  redness at IV site- apply warm compress to area; if redness or soreness persist- contact your physician  Gaseous discomfort- walking, belching will help relieve any discomfort    DIET  You may have anything by mouth.  You may eat and drink immediately.  You may resume your regular diet - however -  remember your colon is empty and a heavy meal will produce gas.   Avoid these foods:  vegetables, fried / greasy foods, carbonated drinks    ACTIVITY  You may resume your normal daily activities   Spend the remainder of the day resting -  avoid any strenuous activity.  You may not operate a vehicle for 12 hours  You may not engage in an occupation involving machinery or appliances for rest of today  You may not drink alcoholic beverages for at least 12 hours  Avoid making any critical decisions for at least 24 hour    Follow-up Instructions:   Call Dr. Baldo Bonds for any questions or problems.  Telephone # [phone]  Dr. Alver Austin office will notify you of the biopsy results available  Within 7 to 10 days. We will call you or send a  letter     Can stop the pantoprazole.    ENDOSCOPY FINDINGS:   Your endoscopy showed that the ulcers have all healed.    Patient Education on Sedation / Analgesia Administered for Procedure      For 24 hours after general anesthesia or intravenous analgesia / sedation:  Have someone responsible help you with your care  Limit your activities  Do not drive and operate hazardous machinery  Do not make important personal, legal or business decisions  Do not drink alcoholic beverages  If you have not urinated within 8 hours after discharge, please contact your physician  Resume your medications unless otherwise instructed    You may not be aware of slight changes in your behavior and/or your reaction time because of the medication used during and after your procedure.    Report the following to your physician:  Excessive pain, swelling, redness or odor of or around the surgical area  Temperature over 100.5  Nausea and vomiting lasting longer than 4 hours or if unable to take medications  Any signs of decreased circulation or nerve impairment to extremity: change in color, persistent numbness, tingling, coldness or increase pain  Any questions or concerns    IF YOU REPORT TO AN EMERGENCY ROOM, DOCTOR'S OFFICE OR HOSPITAL WITHIN 24 HOURS AFTER YOUR PROCEDURE, BRING THIS SHEET AND YOUR AFTER VISIT SUMMARY WITH YOU AND GIVE IT TO THE PHYSICIAN  OR NURSE ATTENDING YOU.

## 2023-10-07 NOTE — Anesthesia Post-Procedure Evaluation (Signed)
 Department of Anesthesiology  Postprocedure Note    Patient: Stephanie Randolph  MRN: [id number]  Birthdate: Feb 23, 1951  Date of evaluation: 10/07/2023    Procedure Summary       Date: 10/07/23 Room / Location: [facility] 04 / [facility]    Anesthesia Start: 0828 Anesthesia Stop: 0842    Procedure: ESOPHAGOGASTRODUODENOSCOPY Diagnosis:       Acute gastric ulcer with hemorrhage      (Acute gastric ulcer with hemorrhage [K25.0])    Surgeons: Scot Cutter., MD Responsible Provider: Alane Hsu, MD    Anesthesia Type: MAC ASA Status: 2            Anesthesia Type: MAC    Aldrete Phase I: Aldrete Score: 10    Aldrete Phase II: Aldrete Score: 10    Anesthesia Post Evaluation    Patient location during evaluation: PACU  Patient participation: complete - patient participated  Level of consciousness: awake  Airway patency: patent  Nausea & Vomiting: no vomiting  Cardiovascular status: hemodynamically stable  Respiratory status: acceptable  Hydration status: euvolemic    No notable events documented.

## 2023-10-07 NOTE — Anesthesia Pre-Procedure Evaluation (Signed)
 Department of Anesthesiology  Preprocedure Note       Name:  Stephanie Randolph   Age:  73 y.o.  DOB:  [birth year]                                          MRN:  [id number]         Date:  10/07/2023      Surgeon: Kelby Patches):  Scot Cutter., MD    Procedure: Procedure(s):  ESOPHAGOGASTRODUODENOSCOPY    Medications prior to admission:   Prior to Admission medications    Medication Sig Start Date End Date Taking? Authorizing Provider   Dapagliflozin Propanediol (FARXIGA PO) Take by mouth   Yes [provider]   amLODIPine (NORVASC) 5 MG tablet Take 1 tablet by mouth daily    Automatic Reconciliation, Ar   vitamin D (CHOLECALCIFEROL) 25 MCG (1000 UT) TABS tablet Take 1 tablet by mouth daily    Automatic Reconciliation, Ar   metFORMIN (GLUCOPHAGE) 500 MG tablet Take 2 tablets by mouth 2 times daily 10/18/09   Automatic Reconciliation, Ar   simvastatin (ZOCOR) 40 MG tablet Take 1 tablet by mouth daily    Automatic Reconciliation, Ar   triamterene-hydroCHLOROthiazide (MAXZIDE-25) 37.5-25 MG per tablet Take 1 tablet by mouth daily    Automatic Reconciliation, Ar       Current medications:    Current Facility-Administered Medications   Medication Dose Route Frequency Provider Last Rate Last Admin    sodium chloride flush 0.9 % injection 5-40 mL  5-40 mL IntraVENous 2 times per day Scot Cutter., MD        sodium chloride flush 0.9 % injection 5-40 mL  5-40 mL IntraVENous PRN Scot Cutter., MD        0.9 % sodium chloride infusion   IntraVENous PRN Scot Cutter., MD           Allergies:    Allergies   Allergen Reactions    Ampicillin Rash       Problem List:    Patient Active Problem List   Diagnosis Code    Gallstones K80.20       Past Medical History:        Diagnosis Date    Arthritis     knees    Diabetes (HCC)     Hypercholesterolemia     Hypertension     Upper GI bleed        Past Surgical History:        Procedure Laterality Date    COLON CA SCRN NOT HI RSK  IND  02/10/2020         COLONOSCOPY N/A 02/10/2020    COLONOSCOPY performed by Scot Cutter., MD at [facility]    GYN      1 vaginal birth    PR UNLISTED PROCEDURE ABDOMEN PERITONEUM & OMENTUM  03/29/12    ROBOTIC ASSISTED CHOLECYSTECTOMY, WITH INTRAOPERTAIVE CHOLANGIOGRAM     TUBAL LIGATION         Social History:    Social History     Tobacco Use    Smoking status: Never    Smokeless tobacco: Never   Substance Use Topics    Alcohol use: No  Counseling given: Not Answered      Vital Signs (Current): There were no vitals filed for this visit.                                           BP Readings from Last 3 Encounters:   No data found for BP       NPO Status:                                                                                 BMI:   Wt Readings from Last 3 Encounters:   No data found for Wt     There is no height or weight on file to calculate BMI.    CBC: No results found for: "WBC", "RBC", "HGB", "HCT", "MCV", "RDW", "PLT"    CMP: No results found for: "NA", "K", "CL", "CO2", "BUN", "CREATININE", "GFRAA", "AGRATIO", "LABGLOM", "GLUCOSE", "GLU", "CALCIUM", "BILITOT", "ALKPHOS", "AST", "ALT"    POC Tests: No results for input(s): "POCGLU", "POCNA", "POCK", "POCCL", "POCBUN", "POCHEMO", "POCHCT" in the last 72 hours.    Coags: No results found for: "PROTIME", "INR", "APTT"    HCG (If Applicable): No results found for: "PREGTESTUR", "PREGSERUM", "HCG", "HCGQUANT"     ABGs: No results found for: "PHART", "PO2ART", "PCO2ART", "HCO3ART", "BEART", "O2SATART"     Type & Screen (If Applicable):  No results found for: "ABORH", "LABANTI"    Drug/Infectious Status (If Applicable):  No results found for: "HIV", "HEPCAB"    COVID-19 Screening (If Applicable):   Lab Results   Component Value Date/Time    COVID19 Not detected 02/06/2020 06:11 AM           Anesthesia Evaluation  Patient summary reviewed and Nursing notes reviewed  Airway: Mallampati: I     Neck ROM:  full  Mouth opening: > = 3 FB   Dental:      Comment: Several missing teeth, denies any loose teeth    Pulmonary:Negative Pulmonary ROS and normal exam                               Cardiovascular:  Exercise tolerance: good (>4 METS)  (+) hypertension:, hyperlipidemia                  Neuro/Psych:   Negative Neuro/Psych ROS              GI/Hepatic/Renal:            ROS comment: Acute gastric ulcer with hemorrhage  .   Endo/Other:    (+) DiabetesType II DM, : arthritis:..                 Abdominal:             Vascular: negative vascular ROS.         Other Findings:             Anesthesia Plan      TIVA     ASA 2       Induction: intravenous.  continuous noninvasive hemodynamic monitor    Anesthetic plan and risks discussed with patient.      Plan discussed with CRNA.                    Gurney Lefort, MD   10/07/2023

## 2023-10-07 NOTE — H&P (Signed)
 Baldo Bonds, MD  [facility]  [address], [address]  [place], [place] [address]  [phone]  [email or web address]      Gastroenterology Outpatient History and Physical    Patient: Stephanie Randolph    Physician: Laurinda Porch, MD    Vital Signs: Blood pressure (!) 134/59, pulse 70, resp. rate 14, height 1.702 m (5\' 7" ), weight 61.6 kg (135 lb 12.8 oz), SpO2 98%.    Allergies:   Allergies   Allergen Reactions    Ampicillin Rash       Chief Complaint: ulcers    History of Present Illness: Follow up of bleeding gastric and duodenal ulcers with bleeding. Remains on pantoprazole 40 mg once daily.    History:  Past Medical History:   Diagnosis Date    Arthritis     knees    Diabetes (HCC)     Hypercholesterolemia     Hypertension     Upper GI bleed       Past Surgical History:   Procedure Laterality Date    COLON CA SCRN NOT HI RSK IND  02/10/2020         COLONOSCOPY N/A 02/10/2020    COLONOSCOPY performed by Scot Cutter., MD at [facility]    GYN      1 vaginal birth    HIP SURGERY Right     PR UNLISTED PROCEDURE ABDOMEN PERITONEUM & OMENTUM  03/29/12    ROBOTIC ASSISTED CHOLECYSTECTOMY, WITH INTRAOPERTAIVE CHOLANGIOGRAM     TUBAL LIGATION        Social History     Socioeconomic History    Marital status: Married     Spouse name: None    Number of children: None    Years of education: None    Highest education level: None   Tobacco Use    Smoking status: Never    Smokeless tobacco: Never   Vaping Use    Vaping status: Never Used   Substance and Sexual Activity    Alcohol use: No    Drug use: No      Family History   Problem Relation Age of Onset    Cancer Maternal Grandmother         unknown    Hypertension Mother     Diabetes Father     Hypertension Father     Cancer Maternal Aunt         breast cancer      Patient Active Problem List   Diagnosis    Gallstones         Medications:   Prior to Admission medications    Medication Sig Start Date End Date  Taking? Authorizing Provider   pantoprazole (PROTONIX) 40 MG tablet Take 1 tablet by mouth daily 07/04/23  Yes [provider]   Dapagliflozin Propanediol (FARXIGA PO) Take by mouth   Yes [provider]   amLODIPine (NORVASC) 5 MG tablet Take 1 tablet by mouth daily   Yes Automatic Reconciliation, Ar   vitamin D (CHOLECALCIFEROL) 25 MCG (1000 UT) TABS tablet Take 1 tablet by mouth daily   Yes Automatic Reconciliation, Ar   metFORMIN (GLUCOPHAGE) 500 MG tablet Take 2 tablets by mouth 2 times daily 10/18/09  Yes Automatic Reconciliation, Ar   simvastatin (ZOCOR) 40 MG tablet Take 1 tablet by mouth daily   Yes Automatic Reconciliation, Ar   triamterene-hydroCHLOROthiazide (MAXZIDE-25) 37.5-25 MG per tablet Take 1 tablet by mouth daily   Yes Automatic Reconciliation, Ar  Physical Exam:     General: well developed, well nourished   HEENT: unremarkable   Heart: regular rhythm no mumur    Lungs: clear   Abdominal:  benign   Neurological: unremarkable   Extremities: no edema     Findings/Diagnosis: Hx of bleeding gastric and duodenal ulcers    Plan of Care/Planned Procedure: EGD with biopsies with  monitored anesthesia care sedation       Signed:  Laurinda Porch, MD 10/07/2023

## 2023-11-20 ENCOUNTER — Encounter: Payer: Self-pay | Admitting: Advanced Practice Midwife

## 2023-11-23 ENCOUNTER — Ambulatory Visit: Attending: Cardiology | Admitting: Cardiology

## 2023-11-23 ENCOUNTER — Encounter: Payer: Self-pay | Admitting: Cardiology

## 2023-11-23 VITALS — BP 138/84 | HR 62 | Ht 66.0 in | Wt 206.7 lb

## 2023-11-23 DIAGNOSIS — I252 Old myocardial infarction: Secondary | ICD-10-CM | POA: Diagnosis not present

## 2023-11-23 DIAGNOSIS — G479 Sleep disorder, unspecified: Secondary | ICD-10-CM

## 2023-11-23 DIAGNOSIS — I10 Essential (primary) hypertension: Secondary | ICD-10-CM

## 2023-11-23 DIAGNOSIS — I77810 Thoracic aortic ectasia: Secondary | ICD-10-CM

## 2023-11-23 DIAGNOSIS — I2510 Atherosclerotic heart disease of native coronary artery without angina pectoris: Secondary | ICD-10-CM | POA: Diagnosis not present

## 2023-11-23 DIAGNOSIS — E785 Hyperlipidemia, unspecified: Secondary | ICD-10-CM

## 2023-11-23 DIAGNOSIS — E039 Hypothyroidism, unspecified: Secondary | ICD-10-CM

## 2023-11-23 DIAGNOSIS — Z9861 Coronary angioplasty status: Secondary | ICD-10-CM

## 2023-11-23 NOTE — Patient Instructions (Signed)
 Medication Instructions:  No changes    *If you need a refill on your cardiac medications before your next appointment, please call your pharmacy*   Lab Work: fasting CMP LIPID TSH If you have labs (blood work) drawn today and your tests are completely normal, you will receive your results only by: MyChart Message (if you have MyChart) OR A paper copy in the mail If you have any lab test that is abnormal or we need to change your treatment, we will call you to review the results.   Testing/Procedures:  Not needed  Follow-Up: At [facility], you and your health needs are our priority.  As part of our continuing mission to provide you with exceptional heart care, we have created designated Provider Care Teams.  These Care Teams include your primary Cardiologist (physician) and Advanced Practice Providers (APPs -  Physician Assistants and Nurse Practitioners) who all work together to provide you with the care you need, when you need it.     Your next appointment:   6 month(s)  The format for your next appointment:   In Person  Provider:   [name], NP      Then, [name], MD will plan to see you again in 12 month(s).

## 2023-11-23 NOTE — Progress Notes (Signed)
 Cardiology Office Note:  .   Date:  11/28/2023  ID:  Brooke Proctor, DOB Apr 26, 1951, MRN [id number] PCP: Brooke Proctor  [facility] Providers Cardiologist:  [name], MD     Chief Complaint  Patient presents with   Follow-up    6 months.  Did not get echo or monitor done.  Also did not have CTA chest or sleep study done   Coronary Artery Disease    No real angina    Patient Profile: .     Aviyana Sonntag is a mildly obese 73 y.o. female with a PMH notable for CAD (NSTEMI in 2016 with PCI to LAD and RCA), septal LVH, HTN HLD who presents here for delayed 26-month follow-up at the request of Brooke Asberry B, DO.  Cardiac Cath-PCI 09/20/2014: 20% p-mLAD, 90% mLAD, 50% OM1, 50% OM 2, & 80% mRCA: DES PCI - 90% mid LAD & 80% mid RCA lesion treated with DES complicated by transient no flow phenomenon. Myoview  01/2017: no Ischemia or Infarct.  TTE 06/2018: Normal EF 60 to 65%.  LVH (septal).  Mild anterior MI leaflet S.A.M. LVOT obstruction at rest (?  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 TTE 01/2019: EF 70-75%.  Hyperkinetic LV.  Severely increased septal wall.  GR 1 DD.  Normal biatrial size.  No valve stenosis.  Ascending aorta 43 mm.**  I last saw Brooke Proctor via Telehealth in February 2022 which was 6 years out from her MI.  EF was preserved and no further angina or heart failure symptoms.  Also no palpitations.  She noted exertional dyspnea with vigorous exertion.  LDL is 55.  No medication changes made.     Brooke Proctor was last seen by Brooke West, NP on March 17, 2023 (having been seen by APP's in the interim from my last visit.  In June 2023 she was noted to have elevated blood pressures and was started on benazepril  40 mg daily.  During this visit she was feeling well this ongoing fatigue difficulty staying asleep at night.  Some exertional dyspnea climbing 1-2 flights of stairs but no chest pain.   Somewhat more increase in palpitations maybe 3-second episodes. => Echo and event monitor ordered but never followed through on.  Plan was also to repeat CTA of the chest to assess for dilated thoracic aorta.  Also scheduled for tomorrow OSA test.  Subjective  Discussed the use of AI scribe software for clinical note transcription with the patient, who gave verbal consent to proceed.  History of Present Illness History of Present Illness Brooke Proctor is a 73 year old female with coronary artery disease who presents for follow-up regarding her cardiac health.  She reports that on about three occasions since November 2024, she has experienced sudden shortness of breath while lying in bed, requiring her to sit up to catch her breath. These episodes have occurred approximately three times since November 2024. No chest pain, peripheral edema, palpitations, dizziness, or syncope.  She has a history of coronary artery disease with stents placed in the mid LAD and RCA in 2016 due to symptoms of diaphoresis and mild chest pain initially thought to be indigestion. She has not experienced similar symptoms since the stent placement.  Her cholesterol levels were last checked in March 2024, with a total cholesterol of 130, triglycerides of 98, HDL of 42, and LDL of 70. She is currently taking atorvastatin , but there is  a noted increase in LDL from previous years.  She reports fatigue and difficulty maintaining sleep, often waking up after four to five hours. No symptoms of sleep apnea, but acknowledges snoring as reported by others.  Her current medications include benazepril  and metoprolol , with some confusion about the benazepril  dosage as it was listed as both 20 mg and 40 mg in her records. No recent changes in her medication regimen.  Cardiovascular ROS: positive for - orthopnea, paroxysmal nocturnal dyspnea, and difficulty sleeping negative for - chest pain, dyspnea on exertion, edema, loss of  consciousness, palpitations, rapid heart rate, shortness of breath, or syncope or near syncope, TIA/CVA or amaurosis fugax;  claudication.     Objective   Medications - Atorvastatin  40 mg daily - Synthroid  100 mcg daily - Benazepril  20 mg (although 40 mg also listed)  - Metoprolol  succinate 75 mg daily - Brilinta  60 mg twice daily - Klor-Con  20 mill equivalents daily  Studies Reviewed: .        Results LABS Total Cholesterol: 130 (07/2022) Triglycerides: 98 (07/2022) HDL: 42 (07/2022) LDL: 70 (07/2022) Creatinine: 0.88 (07/2022) Potassium: 4.0 (07/2022)  DIAGNOSTIC Cardiac Catheterization: Mid LAD 90% stenosis, mid RCA 80% stenosis, stents placed in LAD and RCA :  Stents placed in mid LAD (2.5 x 16 mm post dilated to 2.6 mm) and mid RCA (4.0 x 16 mm post dilated to 4.5-4.6 mm) (2016)  CTA Chest-Aorta: Unchanged mild ectasia of ascending thoracic aorta up to 40 mm -> stable when compared to May 2016.  (03/01/2020)  No new studies.  Neither the monitor or echo from November 2024 completed.  Risk Assessment/Calculations:     STOP-Bang Score:  4        Physical Exam:   VS:  BP 138/84   Pulse 62   Ht 5' 6 (1.676 m)   Wt 206 lb 11.2 oz (93.8 kg)   SpO2 93%   BMI 33.36 kg/m    Wt Readings from Last 3 Encounters:  11/23/23 206 lb 11.2 oz (93.8 kg)  03/17/23 207 lb 12.8 oz (94.3 kg)  10/29/21 213 lb 9.6 oz (96.9 kg)    GEN: Well nourished, well groomed in no acute distress; mildly obese but otherwise healthy-appearing. NECK: No JVD; No carotid bruits CARDIAC: Normal S1, S2; RRR, no murmurs, rubs, gallops RESPIRATORY:  Clear to auscultation without rales, wheezing or rhonchi ; nonlabored, good air movement. ABDOMEN: Soft, non-tender, non-distended EXTREMITIES:  No edema; No deformity      ASSESSMENT AND PLAN: .    Problem List Items Addressed This Visit       Cardiology Problems   CAD S/P percutaneous coronary angioplasty (Chronic)   Stents in both the LAD  and RCA. -Currently on maintenance Brilinta  60 mg twice daily.  Low threshold to convert to Plavix and 5 mg daily or even back to aspirin  if there is any evidence of bleeding.  -> Okay to hold Brilinta  5 to 7 days preop for surgeries or procedures.      Relevant Orders   TSH   Lipid panel   Comprehensive metabolic panel with GFR   Coronary artery disease involving native coronary artery of native heart without angina pectoris - Primary (Chronic)   No further angina since PCI in 2016. Occasional nocturnal dyspnea without exertion. Blood pressure slightly elevated. - For now continue current dose of Toprol -XL 75 mg daily and benazepril  which I think she is taking 20 mg daily (confirm dose) - Monitor blood pressure at home. -  Consider adding hydrochlorothiazide  if blood pressure remains elevated. - Continue atorvastatin  40 mg daily and recheck cholesterol levels. -Continue maintenance Brilinta  60 mg twice daily      Relevant Orders   TSH   Lipid panel   Comprehensive metabolic panel with GFR   Essential hypertension (Chronic)   Blood pressure slightly elevated. On benazepril  20 mg and metoprolol  succinate 75 mg daily. Discussed potential addition of hydrochlorothiazide . - Monitor blood pressure at home. - Consider adding hydrochlorothiazide  if blood pressure remains elevated.      Relevant Orders   TSH   Lipid panel   Comprehensive metabolic panel with GFR   Hyperlipidemia with target low density lipoprotein (LDL) cholesterol less than 55 mg/dL (Chronic)   Cholesterol levels last checked March 2024. LDL increased from 55 in 2021 to 70 in 2024. Discussed dietary changes or decreased atorvastatin  efficacy. - Recheck cholesterol levels. - Consider converting to rosuvastatin if lipids are not improved.      Thoracic aortic ectasia (HCC) (Chronic)   Last evaluation was in October 2021 showing stable findings from 2016. Reasonable to reassess with repeat CTA Chest--Aorta-scheduled  for this fall.        Other   History of non-STEMI (Chronic)   Relevant Orders   TSH   Lipid panel   Comprehensive metabolic panel with GFR   Hypothyroid (Chronic)   Last thyroid function tests in 2024. - Recheck thyroid function if not done recently. - Establish care with a new primary care provider for ongoing management.      Sleep disturbance (Chronic)   Difficulty staying asleep, 4-5 hours per night. No symptoms of sleep apnea. Occasional snoring reported. STOP-BANG recorded was 4.  But what she is describing does not sound like symptomatic sleep apnea and that she sleeps for 5 hours and seems relatively stable.  Is just difficulty staying awake.  Would recommend formal sleep evaluation if symptoms worsen.             Follow-Up: Return in about 6 months (around 05/25/2024) for Alternate 6 month follow-up with APP & MD.  Total time spent: 23 min spent with patient + 23 min spent charting = 46 min I spent 46 minutes in the care of [employer] today including reviewing labs (1 minute), reviewing outside labs from PCP via [facility] and Care Everywhere (1 minute), reviewing studies (cath reviewed as well as previous echoes and CT scans-5 minutes), face to face time discussing treatment options (23 minutes), reviewing records from several notes from APP's (6 minutes), 10 minutes dictating, and documenting in the encounter.    Signed, Alm MICAEL Clay, MD, MS [name], M.D., M.S. Interventional Chartered certified accountant  Pager # [phone]

## 2023-11-23 NOTE — Progress Notes (Deleted)
  Cardiology Office Note:  .   Date:  11/23/2023  ID:  Brooke Proctor, DOB March 03, 1951, MRN [id number] PCP: [name] NOVAK DO  [facility] Providers Cardiologist:  [name], MD { Click to update primary MD,subspecialty MD or APP then REFRESH:1}    No chief complaint on file.   Patient Profile: .     Brooke Proctor is a mildly obese 73 y.o. female with a PMH notable for CAD-PCI (LAD & RCA in 2016), HTN, HLD who presents here for ~7-8 month f/u  at the request of Everly, Rebecca B, DO.  {There is no content from the last Narrative History section.}      Brooke Proctor was last seen on ***  Subjective  Discussed the use of AI scribe software for clinical note transcription with the patient, who gave verbal consent to proceed.  History of Present Illness       Objective    Studies Reviewed: SABRA       No new studies   Risk Assessment/Calculations:         STOP-Bang Score:  4  { Consider Dx Sleep Disordered Breathing or Sleep Apnea  ICD G47.33          :1}    - more likely related to poor sleep etiquette   Physical Exam:   VS:  BP 138/84   Pulse 62   Ht 5' 6 (1.676 m)   Wt 206 lb 11.2 oz (93.8 kg)   SpO2 93%   BMI 33.36 kg/m    Wt Readings from Last 3 Encounters:  11/23/23 206 lb 11.2 oz (93.8 kg)  03/17/23 207 lb 12.8 oz (94.3 kg)  10/29/21 213 lb 9.6 oz (96.9 kg)    GEN: Well nourished, well groomed in no acute distress; mildly obese, otw healthy appearing NECK: No JVD; No carotid bruits CARDIAC: Normal S1, S2; RRR, no murmurs, rubs, gallops RESPIRATORY:  Clear to auscultation without rales, wheezing or rhonchi ; nonlabored, good air movement. ABDOMEN: Soft, non-tender, non-distended EXTREMITIES:  No edema; No deformity      ASSESSMENT AND PLAN: .    Problem List Items Addressed This Visit   None   Assessment and Plan Assessment & Plan        {Are you ordering a CV Procedure (e.g. stress test, cath, DCCV, TEE, etc)?    Press F2        :[id number]}   Follow-Up: No follow-ups on file.  Total time spent: *** min spent with patient + *** min spent charting = *** min    Signed, Alm MICAEL Clay, MD, MS [name], M.D., M.S. Interventional Chartered certified accountant  Pager # [phone]

## 2023-11-28 ENCOUNTER — Encounter: Payer: Self-pay | Admitting: Cardiology

## 2023-11-28 DIAGNOSIS — G479 Sleep disorder, unspecified: Secondary | ICD-10-CM | POA: Insufficient documentation

## 2023-11-28 NOTE — Assessment & Plan Note (Signed)
 Blood pressure slightly elevated. On benazepril  20 mg and metoprolol  succinate 75 mg daily. Discussed potential addition of hydrochlorothiazide . - Monitor blood pressure at home. - Consider adding hydrochlorothiazide  if blood pressure remains elevated.

## 2023-11-28 NOTE — Assessment & Plan Note (Signed)
 Difficulty staying asleep, 4-5 hours per night. No symptoms of sleep apnea. Occasional snoring reported. STOP-BANG recorded was 4.  But what she is describing does not sound like symptomatic sleep apnea and that she sleeps for 5 hours and seems relatively stable.  Is just difficulty staying awake.  Would recommend formal sleep evaluation if symptoms worsen.

## 2023-11-28 NOTE — Assessment & Plan Note (Signed)
 Cholesterol levels last checked March 2024. LDL increased from 55 in 2021 to 70 in 2024. Discussed dietary changes or decreased atorvastatin  efficacy. - Recheck cholesterol levels. - Consider converting to rosuvastatin if lipids are not improved.

## 2023-11-28 NOTE — Assessment & Plan Note (Addendum)
 No further angina since PCI in 2016. Occasional nocturnal dyspnea without exertion. Blood pressure slightly elevated. - For now continue current dose of Toprol -XL 75 mg daily and benazepril  which I think she is taking 20 mg daily (confirm dose) - Monitor blood pressure at home. - Consider adding hydrochlorothiazide  if blood pressure remains elevated. - Continue atorvastatin  40 mg daily and recheck cholesterol levels. -Continue maintenance Brilinta  60 mg twice daily

## 2023-11-28 NOTE — Assessment & Plan Note (Signed)
 Last thyroid function tests in 2024. - Recheck thyroid function if not done recently. - Establish care with a new primary care provider for ongoing management.

## 2023-11-28 NOTE — Assessment & Plan Note (Signed)
 Last evaluation was in October 2021 showing stable findings from 2016. Reasonable to reassess with repeat CTA Chest--Aorta-scheduled for this fall.

## 2023-11-28 NOTE — Assessment & Plan Note (Signed)
 Stents in both the LAD and RCA. -Currently on maintenance Brilinta  60 mg twice daily.  Low threshold to convert to Plavix and 5 mg daily or even back to aspirin  if there is any evidence of bleeding.  -> Okay to hold Brilinta  5 to 7 days preop for surgeries or procedures.

## 2024-01-21 ENCOUNTER — Other Ambulatory Visit: Payer: Self-pay | Admitting: Cardiology

## 2024-01-30 ENCOUNTER — Other Ambulatory Visit: Payer: Self-pay | Admitting: Cardiology

## 2024-03-30 ENCOUNTER — Other Ambulatory Visit: Payer: Self-pay | Admitting: Cardiology

## 2024-04-20 ENCOUNTER — Telehealth: Payer: Self-pay | Admitting: Cardiology

## 2024-04-20 NOTE — Telephone Encounter (Signed)
 PT is calling to check the status of her request

## 2024-04-20 NOTE — Telephone Encounter (Signed)
 I called and talked to the patient about her hospitalization.  It seems like she was treating herself for flu and took cold medicine with decongestants and ended up going to A-fib [facility].  She is at [facility].  Minimal troponin elevation.  They are talking about maybe doing heart catheterization, but she is not so inclined.  There was an echo done but I cannot see results on that yet.  I also do not see the MD notes. She has a tentative appointment with Scot Ford, PA, but I suggested that perhaps Reena can call her on Friday just to see if there was any earlier appointments but also to potentially just have her come in for an EKG sometime next week

## 2024-04-20 NOTE — Telephone Encounter (Signed)
 04/20/24  8:19 PM Note I called and talked to the patient about her hospitalization.  It seems like she was treating herself for flu and took cold medicine with decongestants and ended up going to A-fib [facility].  She is at [facility].  Minimal troponin elevation.  They are talking about maybe doing heart catheterization, but she is not so inclined.  There was an echo done but I cannot see results on that yet.  I also do not see the MD notes. She has a tentative appointment with Scot Ford, PA, but I suggested that perhaps Reena can call her on Friday just to see if there was any earlier appointments but also to potentially just have her come in for an EKG sometime next week       04/20/24 12:14 PM Radames Corean SAILOR, RN routed this conversation to [place], [place], [place]  Anner Alm ORN, MD  Gladis Reena GAILS, RN Radames Corean SAILOR, RN    04/20/24 12:14 PM Note Spoke with pt. Advised her that her message had been sent over and we had no updates for her at this time. Asked if there was anything I could do for her in the mean time. Patient wanted to set up appointment to be seen. Set appointment with Hao Meng, PA 05/12/24. Advised I would re send request. Pt stated understanding and thanked me for my time.      Would like to see if we can get her in just to recheck an EKG after her discharge.  She has a tentative appointment with Scot Ford, PA in early January, it may be she can be seen earlier if there is any availability.  She has been seen before by Hao Meng and [name].    Vivan Agostino, MD

## 2024-04-20 NOTE — Telephone Encounter (Signed)
 Pt requesting a c/b to discuss her hospital visit.

## 2024-04-20 NOTE — Telephone Encounter (Signed)
 Patient states she is currently admitted at [facility] (notes in chart). She states they are wanting to admit her to the cardiac unit and start her on heparin . She is requesting to speak with Dr. Anner or Scot Ford, PA-C.  Informed patient Dr. Anner is in the cath lab today performing procedures and Hao is in clinic seeing patients. Informed her I will send this message to them, but cannot guarantee they will be able to call her. Patient would not say anything more and requested to have one of the providers call her as soon as they can.

## 2024-04-20 NOTE — Telephone Encounter (Signed)
 Spoke with pt. Advised her that her message had been sent over and we had no updates for her at this time. Asked if there was anything I could do for her in the mean time. Patient wanted to set up appointment to be seen. Set appointment with Hao Meng, PA 05/12/24. Advised I would re send request. Pt stated understanding and thanked me for my time.

## 2024-04-22 NOTE — Telephone Encounter (Signed)
 Patient states she spoke with Dr. Anner during hospitalization (see note below). She will keep appt with Hao on 1/8, but she is not sure if she needs to be seen in A-Fib clinic or come in for nurse visit for EKG as recommended by Dr. Anner.  Patient states she is feeling better than she was a couple days ago.  Patient states Dr. Anner said Reena would follow-up with her on Friday (12/19). Informed patient Reena is in clinic today and may not be able to call her until the end of the day, but I will forward message to her to help arrange nurse visit for EKG. Patient verbalized understanding.

## 2024-04-22 NOTE — Telephone Encounter (Signed)
 Patient is calling to speak to the nurse because she wants a sooner appt. Please advise

## 2024-05-06 ENCOUNTER — Other Ambulatory Visit: Payer: Self-pay | Admitting: Cardiology

## 2024-05-12 ENCOUNTER — Ambulatory Visit

## 2024-05-12 ENCOUNTER — Encounter: Payer: Self-pay | Admitting: Physician Assistant

## 2024-05-12 ENCOUNTER — Ambulatory Visit: Attending: Physician Assistant | Admitting: Physician Assistant

## 2024-05-12 ENCOUNTER — Other Ambulatory Visit (HOSPITAL_COMMUNITY): Payer: Self-pay

## 2024-05-12 VITALS — BP 158/98 | HR 71 | Ht 66.0 in | Wt 211.0 lb

## 2024-05-12 DIAGNOSIS — I10 Essential (primary) hypertension: Secondary | ICD-10-CM | POA: Diagnosis not present

## 2024-05-12 DIAGNOSIS — I38 Endocarditis, valve unspecified: Secondary | ICD-10-CM | POA: Diagnosis not present

## 2024-05-12 DIAGNOSIS — R0609 Other forms of dyspnea: Secondary | ICD-10-CM

## 2024-05-12 DIAGNOSIS — I252 Old myocardial infarction: Secondary | ICD-10-CM | POA: Diagnosis not present

## 2024-05-12 DIAGNOSIS — E785 Hyperlipidemia, unspecified: Secondary | ICD-10-CM | POA: Diagnosis not present

## 2024-05-12 DIAGNOSIS — I480 Paroxysmal atrial fibrillation: Secondary | ICD-10-CM | POA: Diagnosis not present

## 2024-05-12 DIAGNOSIS — Z9861 Coronary angioplasty status: Secondary | ICD-10-CM

## 2024-05-12 DIAGNOSIS — I2510 Atherosclerotic heart disease of native coronary artery without angina pectoris: Secondary | ICD-10-CM

## 2024-05-12 MED ORDER — ATORVASTATIN CALCIUM 40 MG PO TABS: 80.0000 mg | ORAL_TABLET | Freq: Every day | ORAL | Status: AC

## 2024-05-12 MED ORDER — BENAZEPRIL HCL 40 MG PO TABS
40.0000 mg | ORAL_TABLET | Freq: Every day | ORAL | 0 refills | Status: AC
  Filled 2024-05-12: qty 14, 14d supply, fill #0

## 2024-05-12 MED ORDER — BENAZEPRIL HCL 40 MG PO TABS: 40.0000 mg | ORAL_TABLET | Freq: Every day | ORAL | 3 refills | Status: AC

## 2024-05-12 NOTE — Progress Notes (Unsigned)
 Enrolled patient for a 14 day Zio XT to be mailed to patients home   [address]

## 2024-05-12 NOTE — Patient Instructions (Signed)
 Medication Instructions:   INCREASE atorvastatin  to 80mg  daily -- take 2 of the 40mg  tablets  Benazepril  40mg  -- refilled for 2 weeks to pharmacy here and then also to [facility]  *If you need a refill on your cardiac medications before your next appointment, please call your pharmacy*  Lab Work:  FASTING lab work in 6 weeks - lipid panel, liver enzymes  If you have labs (blood work) drawn today and your tests are completely normal, you will receive your results only by: MyChart Message (if you have MyChart) OR A paper copy in the mail If you have any lab test that is abnormal or we need to change your treatment, we will call you to review the results.  Testing/Procedures:  Cardiac PET Stress Test -- you will get a call to schedule once approved with insurance  ZIO XT- Long Term Monitor Instructions  Your physician has requested you wear a ZIO patch monitor for 14 days.  This is a single patch monitor. [employer] supplies one patch monitor per enrollment. Additional stickers are not available. Please do not apply patch if you will be having a Nuclear Stress Test,  Echocardiogram, Cardiac CT, MRI, or Chest Xray during the period you would be wearing the  monitor. The patch cannot be worn during these tests. You cannot remove and re-apply the  ZIO XT patch monitor.  Your ZIO patch monitor will be mailed 3 day USPS to your address on file. It may take 3-5 days  to receive your monitor after you have been enrolled.  Once you have received your monitor, please review the enclosed instructions. Your monitor  has already been registered assigning a specific monitor serial # to you.  Billing and Patient Assistance Program Information  We have supplied [employer] with any of your insurance information on file for billing purposes. [employer] offers a sliding scale Patient Assistance Program for patients that do not have  insurance, or whose insurance does not completely cover the cost of the ZIO  monitor.  You must apply for the Patient Assistance Program to qualify for this discounted rate.  To apply, please call [employer] at [phone], select option 4, select option 2, ask to apply for  Patient Assistance Program. Meredeth will ask your household income, and how many people  are in your household. They will quote your out-of-pocket cost based on that information.  [employer] will also be able to set up a 78-month, interest-free payment plan if needed.  Applying the monitor   Shave hair from upper left chest.  Hold abrader disc by orange tab. Rub abrader in 40 strokes over the upper left chest as  indicated in your monitor instructions.  Clean area with 4 enclosed alcohol pads. Let dry.  Apply patch as indicated in monitor instructions. Patch will be placed under collarbone on left  side of chest with arrow pointing upward.  Rub patch adhesive wings for 2 minutes. Remove white label marked 1. Remove the white  label marked 2. Rub patch adhesive wings for 2 additional minutes.  While looking in a mirror, press and release button in center of patch. A small green light will  flash 3-4 times. This will be your only indicator that the monitor has been turned on.  Do not shower for the first 24 hours. You may shower after the first 24 hours.  Press the button if you feel a symptom. You will hear a small click. Record Date, Time and  Symptom in the Patient Logbook.  When you are ready to remove the patch, follow instructions on the last 2 pages of Patient  Logbook. Stick patch monitor onto the last page of Patient Logbook.  Place Patient Logbook in the blue and white box. Use locking tab on box and tape box closed  securely. The blue and white box has prepaid postage on it. Please place it in the mailbox as  soon as possible. Your physician should have your test results approximately 7 days after the  monitor has been mailed back to [facility].  Call [facility] Customer Care at  [phone] if you have questions regarding  your ZIO XT patch monitor. Call them immediately if you see an orange light blinking on your  monitor.  If your monitor falls off in less than 4 days, contact our Monitor department at [phone].  If your monitor becomes loose or falls off after 4 days call [employer] at [phone] for  suggestions on securing your monitor   Follow-Up: At [facility], you and your health needs are our priority.  As part of our continuing mission to provide you with exceptional heart care, our providers are all part of one team.  This team includes your primary Cardiologist (physician) and Advanced Practice Providers or APPs (Physician Assistants and Nurse Practitioners) who all work together to provide you with the care you need, when you need it.  Your next appointment:    6 weeks with Hao Meng PA  We recommend signing up for the patient portal called MyChart.  Sign up information is provided on this After Visit Summary.  MyChart is used to connect with patients for Virtual Visits (Telemedicine).  Patients are able to view lab/test results, encounter notes, upcoming appointments, etc.  Non-urgent messages can be sent to your provider as well.   To learn more about what you can do with MyChart, go to [email or web address].   Other Instructions     Please report to Radiology at the [facility] Main Entrance 30 minutes early for your test.  [address] [place], [place] [address]                         OR   Please report to Radiology at [facility] Main Entrance, medical mall, 30 mins prior to your test.  [address]  [place], [place]  How to Prepare for Your Cardiac PET/CT Stress Test:  Nothing to eat or drink, except water, 3 hours prior to arrival time.  NO caffeine/decaffeinated products, or chocolate 12 hours prior to arrival. (Please note decaffeinated beverages (teas/coffees) still  contain caffeine).  If you have caffeine within 12 hours prior, the test will need to be rescheduled.  Medication instructions: Do not take erectile dysfunction medications for 72 hours prior to test (sildenafil, tadalafil) Do not take nitrates (isosorbide mononitrate, Ranexa) the day before or day of test Do not take tamsulosin the day before or morning of test Hold theophylline containing medications for 12 hours. Hold Dipyridamole 48 hours prior to the test.  Diabetic Preparation: If able to eat breakfast prior to 3 hour fasting, you may take all medications, including your insulin. Do not worry if you miss your breakfast dose of insulin - start at your next meal. If you do not eat prior to 3 hour fast-Hold all diabetes (oral and insulin) medications. Patients who wear a continuous glucose monitor MUST remove the device prior to scanning.  You may take  your remaining medications with water.  NO perfume, cologne or lotion on chest or abdomen area. FEMALES - Please avoid wearing dresses to this appointment.  Total time is 1 to 2 hours; you may want to bring reading material for the waiting time.  IF YOU THINK YOU MAY BE PREGNANT, OR ARE NURSING PLEASE INFORM THE TECHNOLOGIST.  In preparation for your appointment, medication and supplies will be purchased.  Appointment availability is limited, so if you need to cancel or reschedule, please call the Radiology Department Scheduler at [phone] 24 hours in advance to avoid a cancellation fee of $100.00  What to Expect When you Arrive:  Once you arrive and check in for your appointment, you will be taken to a preparation room within the Radiology Department.  A technologist or Nurse will obtain your medical history, verify that you are correctly prepped for the exam, and explain the procedure.  Afterwards, an IV will be started in your arm and electrodes will be placed on your skin for EKG monitoring during the stress portion of the exam.  Then you will be escorted to the PET/CT scanner.  There, staff will get you positioned on the scanner and obtain a blood pressure and EKG.  During the exam, you will continue to be connected to the EKG and blood pressure machines.  A small, safe amount of a radioactive tracer will be injected in your IV to obtain a series of pictures of your heart along with an injection of a stress agent.    After your Exam:  It is recommended that you eat a meal and drink a caffeinated beverage to counter act any effects of the stress agent.  Drink plenty of fluids for the remainder of the day and urinate frequently for the first couple of hours after the exam.  Your doctor will inform you of your test results within 7-10 business days.  For more information and frequently asked questions, please visit our website: [email or web address]  For questions about your test or how to prepare for your test, please call: Cardiac Imaging Nurse Navigators Office: [phone]

## 2024-05-12 NOTE — Progress Notes (Signed)
 " Cardiology Office Note   Date:  05/13/2024  ID:  Brooke Proctor, DOB 05-16-1950, MRN [id number] PCP: No primary care provider on file.  [name] Cardiologist:  [name], MD     History of Present Illness Brooke Proctor is a 74 y.o. female with a hx of CAD, hypertension, hyperlipidemia and obesity.  Patient had NSTEMI in 2016, cardiac catheterization at the time revealed a 20% proximal to mid LAD, 50% OM1, 50% OM2, 90% mid LAD lesion treated with DES, 80% mid RCA lesion treated with DES complicated by transient no flow phenomena.  Myoview  performed in September 2018 showed no ischemia or infarction.  TTE obtained in February 2020 showed EF 60 to 65%, septal LVH, mild SAMT LVOT obstruction at rest.  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 Last CTA obtained in October 2021 showed dilated ascending aorta measuring at 40 mm.  Last echocardiogram obtained in September 2020 showed EF 70 to 75%, hyperkinetic LV, severely increased septal wall, grade 1 DD, ascending aorta measuring at 43 mm.  He was last seen by Dr. Clay in July 2025 at which time she was doing well.  She was recently diagnosed with flu A on 04/18/2024 and was prescribed Tamiflu by her PCP after she presented with shortness of breath and fatigue for 5 days. She was admitted to the [facility] hospital 2 days later on 04/20/2024 with A-fib with RVR, heart rate was 170s.  EKG also mentions a right bundle branch block and ST depression.  She was placed on diltiazem and spontaneously converted back to sinus rhythm.  Serial troponin 68-->95-->1159-->5725-->15,592-->13,315.  TSH normal at 2.0.  Lactic acid 2.5.  Procalcitonin 0.32, mildly elevated.  Creatinine was 1.93.  Lipid panel showed a total cholesterol 152, triglyceride 89, HDL 44, LDL 91 echocardiogram obtained  on 04/21/2024 showed EF 65 to 70%, moderate concentric LVH, normal RV, moderate MR, dilated ascending aorta measurement of 4.2 cm.  She ended up leaving AMA before she was seen by [facility] cardiology service.  Patient presents today for follow-up.  She says when she went to [facility], her presenting symptom was numbness of her left tongue.  Symptom only last about 3 minutes however was concerning enough for her to call 911.  When EMS arrived, they noted her heart rate was elevated, therefore sent her to [facility].  She did not have a brain scan at [facility].  She also denies any chest pain, however she never really had chest pain given prior to the previous stent placement in 2016 either.  She has been noticing shortness of breath with climbing up an incline for the past several months but no shortness of breath walking on flat ground.  The symptom has been present even prior to her recent flu.  She did not have cardiac awareness of A-fib.  I will obtain a 2-week heart monitor to assess her A-fib burden, if she continued to have recurrent A-fib despite recovering from flu, then she will need to be placed on anticoagulation therapy.  We discussed the need for ischemic workup based on recently elevated troponin.  Again she does not have any chest pain, but she does have dyspnea on exertion.  I will obtain a PET stress test to further assess.  If PET stress test is abnormal, I will  have low threshold of recommending proceeding with cardiac catheterization.  Blood pressure remains elevated, I will increase her benazepril  to 40 mg daily, she has been taking 20 mg benazepril .  Cholesterol is uncontrolled based on outside blood work, will increase Lipitor  to 80 mg daily.  She will need a fasting lipid panel and LFT in 6 weeks on the day she come back to see me.   ROS:   Patient denies any chest pain however does complain of dyspnea on exertion.  She has no lower extremity  edema, orthopnea or PND.  Studies Reviewed EKG Interpretation Date/Time:  Thursday May 12 2024 09:36:39 EST Ventricular Rate:  68 PR Interval:  178 QRS Duration:  126 QT Interval:  454 QTC Calculation: 482 R Axis:   -59  Text Interpretation: Normal sinus rhythm Right bundle branch block Left anterior fascicular block Bifascicular block Chronically elevated J point in aVL, unchanged when compare to the previous EKG from 2024 Minimal J point elevation in lead I Confirmed by Bellany Elbaum [phone]) on 05/12/2024 10:26:39 AM    Cardiac Studies & Procedures   ______________________________________________________________________________________________ CARDIAC CATHETERIZATION  CARDIAC CATHETERIZATION 09/20/2014  Conclusion  Prox LAD to Mid LAD lesion, 20% stenosed.  1st Mrg lesion, 30% stenosed.  2nd Mrg lesion, 30% stenosed.  Mid LAD lesion, 90% stenosed. There is a 0% residual stenosis post intervention.  A drug-eluting stent was placed.  Mid RCA to Dist RCA lesion, 80% stenosed. There is a 0% residual stenosis post intervention.  A drug-eluting stent was placed.  1. 2 vessel obstructive CAD 2. Normal LV function 3. Successful stenting of the mid LAD with DES 4. Successful stenting of the mid RCA with DES. Transient no reflow phenomenon.  Recommendations: continue DAPT for one year. Risk factor modification.  Findings Coronary Findings Diagnostic  Dominance: Right  Left Anterior Descending diffuse . discrete .  Left Circumflex  First Obtuse Marginal Branch discrete .  Second Obtuse Marginal Branch discrete .  Right Coronary Artery tubular, eccentric .  Intervention  Mid LAD lesion PCI The pre-interventional distal flow is normal (TIMI 3). Pre-stent angioplasty was performed. 2.25 mm balloon A drug-eluting stent was placed. 2.5 x 16 mm Promus Post-stent angioplasty was performed. 2.5 Bath balloon Maximum pressure: 16 atm. The post-interventional distal flow is  normal (TIMI 3). The intervention was successful. No complications occurred at this lesion. There is a 0% residual stenosis post intervention.  Mid RCA to Dist RCA lesion PCI The pre-interventional distal flow is normal (TIMI 3). Pre-stent angioplasty was performed. 2.5 mm balloon A drug-eluting stent was placed. 4.0 x 16 mm Promus Post-stent angioplasty was performed. 4.5 mm  balloon Maximum pressure: 16 atm. The post-interventional distal flow is normal (TIMI 3). The intervention was successful. The patient experienced the no reflow phenomenon in this vessel. Mild no reflow associated with chest pain and hypotension. Improved with IC Ntg and IV hydration. There is a 0% residual stenosis post intervention.   STRESS TESTS  MYOCARDIAL PERFUSION IMAGING 01/08/2017  Interpretation Summary  Normal perfusion No ischemia or scar  Nuclear stress EF: 67%.  The study is normal.  This is a low risk study.   ECHOCARDIOGRAM  ECHOCARDIOGRAM LIMITED 01/25/2019  Narrative ECHOCARDIOGRAM LIMITED REPORT    Patient Name:   TEMPRENCE RHINES Date of Exam: 01/25/2019 Medical Rec #:  [id number]         Height:       66.0 in Accession #:    [id number]  Weight:       210.6 lb Date of Birth:  05-10-1950         BSA:          2.04 m Patient Age:    67 years          BP:           140/78 mmHg Patient Gender: F                 HR:           68 bpm. 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 Previous Myocardial Infarction and CAD; Arrythmias:RBBB Signs/Symptoms:Shortness of Breath Risk Factors:Hypertension and Dyslipidemia. Breast Cancer with Chemo and Radiation, NSTEMI, Obesity.  Sonographer:    Heather Hawks RDCS Referring Phys: [phone] Latoi Giraldo  IMPRESSIONS   1. Left ventricular ejection fraction, by  visual estimation, is 70 to 75%. The left ventricle has normal function. Normal left ventricular size. Left ventricular septal wall thickness was severely increased. There is mildly increased left ventricular hypertrophy. 2. Left ventricular diastolic Doppler parameters are consistent with impaired relaxation pattern of LV diastolic filling. 3. Global right ventricle has normal systolic function.The right ventricular size is normal. No increase in right ventricular wall thickness. 4. Left atrial size was normal. 5. Right atrial size was normal. 6. The mitral valve is normal in structure. Mild mitral valve regurgitation. No evidence of mitral stenosis. 7. The tricuspid valve is normal in structure. Tricuspid valve regurgitation was not visualized by color flow Doppler. 8. The aortic valve is normal in structure. Aortic valve regurgitation was not visualized by color flow Doppler. Structurally normal aortic valve, with no evidence of sclerosis or stenosis. 9. The pulmonic valve was normal in structure. Pulmonic valve regurgitation is not visualized by color flow Doppler. 10. Aneurysm of the ascending aorta, measuring 43 mm. 11. Mildly elevated pulmonary artery systolic pressure. 12. The tricuspid regurgitant velocity is 2.65 m/s, and with an assumed right atrial pressure of 3 mmHg, the estimated right ventricular systolic pressure is mildly elevated at 31.1 mmHg. 13. The inferior vena cava is normal in size with greater than 50% respiratory variability, suggesting right atrial pressure of 3 mmHg.  FINDINGS Left Ventricle: Left ventricular ejection fraction, by visual estimation, is 70 to 75%. The left ventricle has normal function. Left ventricular septal wall thickness was severely increased. There is mildly increased left ventricular hypertrophy. Eccentric left ventricular hypertrophy. Normal left ventricular size. Spectral Doppler shows Left ventricular diastolic Doppler parameters are consistent  with impaired relaxation pattern of LV diastolic filling.  Right Ventricle: The right ventricular size is normal. No increase in right ventricular wall thickness. Global RV systolic function is has normal systolic function. The tricuspid regurgitant velocity is 2.65 m/s, and with an assumed right atrial pressure of 3 mmHg, the estimated right ventricular systolic pressure is mildly elevated at 31.1 mmHg.  Left [facility]: Left atrial size was normal in size.  Right [facility]: Right atrial size was normal in size  Pericardium: There is no evidence of pericardial effusion.  Mitral Valve: The mitral valve is normal in structure. No evidence of mitral valve stenosis by observation. MV Area by PHT, 2.71 cm. MV PHT, 81.20 msec. Mild mitral valve regurgitation.  Tricuspid Valve: The tricuspid valve is normal in structure. Tricuspid  valve regurgitation was not visualized by color flow Doppler.  Aortic Valve: The aortic valve is normal in structure. Aortic valve regurgitation was not visualized by color flow Doppler. The aortic valve is structurally normal, with no evidence of sclerosis or stenosis.  Pulmonic Valve: The pulmonic valve was normal in structure. Pulmonic valve regurgitation is not visualized by color flow Doppler.  Aorta: The aortic root, ascending aorta and aortic arch are all structurally normal, with no evidence of dilitation or obstruction. There is an aneurysm involving the ascending aorta. The aneurysm measures 43 mm.  Venous: The inferior vena cava is normal in size with greater than 50% respiratory variability, suggesting right atrial pressure of 3 mmHg.  Shunts: No ventricular septal defect is seen or detected. There is no evidence of an atrial septal defect. No atrial level shunt detected by color flow Doppler.   LEFT VENTRICLE          Normals PLAX 2D LVIDd:         4.10 cm  3.6 cm LVIDs:         1.81 cm  1.7 cm LV PW:         1.22 cm  1.4 cm LV IVS:        1.13 cm  1.3  cm LVOT diam:     2.20 cm  2.0 cm LV SV:         64 ml    79 ml LV SV Index:   29.99    45 ml/m2 LVOT Area:     3.80 cm 3.14 cm2   RIGHT VENTRICLE TAPSE (M-mode): 2.1 cm  LEFT ATRIUM         Index LA diam:    3.60 cm 1.76 cm/m AORTIC VALVE             Normals LVOT Vmax:   128.00 cm/s LVOT Vmean:  86.300 cm/s 75 cm/s LVOT VTI:    0.324 m     25.3 cm  AORTA                 Normals Ao Root diam: 3.50 cm 31 mm Ao Asc diam:  4.30 cm 31 mm  MITRAL VALVE            Normals     TRICUSPID VALVE             Normals MV Area (PHT): cm                  TR Peak grad:   28.1 mmHg MV PHT:        msec     55 ms       TR Vmax:        265.00 cm/s 288 cm/s MV Decel Time: 280 msec 187 ms MR Peak grad: 127.2 mmHg            SHUNTS MR Mean grad: 74.0 mmHg             Systemic VTI:  0.32 m MR Vmax:      564.00 cm/s           Systemic Diam: 2.20 cm MR Vmean:     393.0 cm/s MV E velocity: 94.10 cm/s 103 cm/s MV A velocity: 93.10 cm/s 70.3 cm/s MV E/A ratio:  1.01       1.5   Leim Moose MD Electronically signed by Leim Moose MD Signature Date/Time: 01/25/2019/4:07:15 PM    Final    MONITORS  CARDIAC EVENT  MONITOR 10/23/2015  Narrative Rhythm was sinus and sinus tach. No worrisome arrhythmias were noted.  Vinie KYM Maxcy, MD, [facility] Attending Cardiologist [facility]       ______________________________________________________________________________________________      Risk Assessment/Calculations  CHA2DS2-VASc Score = 4   This indicates a 4.8% annual risk of stroke. The patient's score is based upon: CHF History: 0 HTN History: 1 Diabetes History: 0 Stroke History: 0 Vascular Disease History: 1 Age Score: 1 Gender Score: 1       STOP-Bang Score:         Physical Exam VS:  BP (!) 158/98 (BP Location: Right Arm, Patient Position: Sitting, Cuff Size: Large)   Pulse 71   Ht 5' 6 (1.676 m)   Wt 211 lb (95.7 kg)   SpO2 99%   BMI 34.06 kg/m         Wt Readings from Last 3 Encounters:  05/12/24 211 lb (95.7 kg)  11/23/23 206 lb 11.2 oz (93.8 kg)  03/17/23 207 lb 12.8 oz (94.3 kg)    GEN: Well nourished, well developed in no acute distress NECK: No JVD; No carotid bruits CARDIAC: RRR, no murmurs, rubs, gallops RESPIRATORY:  Clear to auscultation without rales, wheezing or rhonchi  ABDOMEN: Soft, non-tender, non-distended EXTREMITIES:  No edema; No deformity   ASSESSMENT AND PLAN  History of NSTEMI: Recently admitted to the hospital in the setting of rapid A-fib and flu A.  Serial high-sensitivity troponin trended up to 15,000.  Unfortunately, patient left AMA prior to being seen by cardiology service.  She denies any chest pain.  She has been having dyspnea on exertion for several months now even prior to the recent hospitalization.  Will obtain PET stress test  Dyspnea on exertion: See #1.  Will proceed with PET stress test  Paroxysmal atrial fibrillation: Occurred in the setting of flu A.  She self converted on IV diltiazem.  She is maintaining sinus rhythm today.  Will obtain a heart monitor to assess A-fib burden.  If patient has any recurrence of A-fib, we will need to stop the Brilinta  then switch to anticoagulation therapy.  CAD: Denies any chest pain.  However never truly had chest pain even prior to the previous PCI either.  Given elevation of the troponin, will obtain PET stress test  Valvular disease: Echocardiogram obtained recently showed EF of 65 to 70%, moderate mitral regurgitation.  No significant heart murmur on exam  Hyperlipidemia: Increase atorvastatin  to 80 mg daily.  Obtain fasting lipid panel and IFT when she come back in 6 weeks  Hypertension: Increase benazepril  to 40 mg daily.      Informed Consent   Shared Decision Making/Informed Consent The risks [chest pain, shortness of breath, cardiac arrhythmias, dizziness, blood pressure fluctuations, myocardial infarction, stroke/transient ischemic attack,  nausea, vomiting, allergic reaction, radiation exposure, metallic taste sensation and life-threatening complications (estimated to be 1 in 10,000)], benefits (risk stratification, diagnosing coronary artery disease, treatment guidance) and alternatives of a cardiac PET stress test were discussed in detail with Ms. Delores and she agrees to proceed.     Dispo: Follow-up in 6 weeks  Signed, Kess Mcilwain, PA  "

## 2024-06-01 ENCOUNTER — Other Ambulatory Visit: Payer: Self-pay

## 2024-06-02 ENCOUNTER — Other Ambulatory Visit: Payer: Self-pay | Admitting: Cardiology

## 2024-06-07 MED ORDER — METOPROLOL SUCCINATE ER 50 MG PO TB24: 75.0000 mg | ORAL_TABLET | Freq: Every day | ORAL | 3 refills | Status: AC

## 2024-07-07 ENCOUNTER — Ambulatory Visit: Admitting: Physician Assistant
